# Patient Record
Sex: Male | Born: 1950 | Race: White | Hispanic: No | Marital: Married | Smoking: Never smoker
Health system: Southern US, Community
[De-identification: ages and names within clinical notes are randomized; demographics above are authoritative.]

## PROBLEM LIST (undated history)

## (undated) DIAGNOSIS — K5790 Diverticulosis of intestine, part unspecified, without perforation or abscess without bleeding: Secondary | ICD-10-CM

## (undated) DIAGNOSIS — R7303 Prediabetes: Secondary | ICD-10-CM

## (undated) DIAGNOSIS — I2510 Atherosclerotic heart disease of native coronary artery without angina pectoris: Secondary | ICD-10-CM

## (undated) DIAGNOSIS — Z86010 Personal history of colonic polyps: Secondary | ICD-10-CM

## (undated) DIAGNOSIS — G4733 Obstructive sleep apnea (adult) (pediatric): Secondary | ICD-10-CM

## (undated) DIAGNOSIS — I10 Essential (primary) hypertension: Secondary | ICD-10-CM

## (undated) DIAGNOSIS — G500 Trigeminal neuralgia: Secondary | ICD-10-CM

## (undated) DIAGNOSIS — Z955 Presence of coronary angioplasty implant and graft: Secondary | ICD-10-CM

## (undated) DIAGNOSIS — G4730 Sleep apnea, unspecified: Secondary | ICD-10-CM

## (undated) DIAGNOSIS — C801 Malignant (primary) neoplasm, unspecified: Secondary | ICD-10-CM

## (undated) DIAGNOSIS — E785 Hyperlipidemia, unspecified: Secondary | ICD-10-CM

## (undated) HISTORY — PX: SPINE SURGERY: SHX786

## (undated) HISTORY — PX: OTHER SURGICAL HISTORY: SHX169

## (undated) HISTORY — DX: Presence of coronary angioplasty implant and graft: Z95.5

## (undated) HISTORY — DX: Atherosclerotic heart disease of native coronary artery without angina pectoris: I25.10

## (undated) HISTORY — PX: ACHILLES TENDON REPAIR: SUR1153

## (undated) HISTORY — DX: Sleep apnea, unspecified: G47.30

## (undated) HISTORY — DX: Trigeminal neuralgia: G50.0

## (undated) HISTORY — DX: Hyperlipidemia, unspecified: E78.5

## (undated) HISTORY — PX: BILATERAL CARPAL TUNNEL RELEASE: SHX6508

## (undated) HISTORY — DX: Obstructive sleep apnea (adult) (pediatric): G47.33

## (undated) HISTORY — DX: Prediabetes: R73.03

## (undated) HISTORY — DX: Essential (primary) hypertension: I10

## (undated) HISTORY — PX: NASAL SINUS SURGERY: SHX719

## (undated) HISTORY — DX: Diverticulosis of intestine, part unspecified, without perforation or abscess without bleeding: K57.90

## (undated) HISTORY — DX: Personal history of colonic polyps: Z86.010

---

## 1998-03-22 ENCOUNTER — Observation Stay (HOSPITAL_COMMUNITY): Admission: RE | Admit: 1998-03-22 | Discharge: 1998-03-23 | Payer: Self-pay | Admitting: Orthopedic Surgery

## 1998-03-24 ENCOUNTER — Encounter (HOSPITAL_COMMUNITY): Admission: RE | Admit: 1998-03-24 | Discharge: 1998-06-22 | Payer: Self-pay | Admitting: Orthopedic Surgery

## 1998-07-17 ENCOUNTER — Inpatient Hospital Stay (HOSPITAL_COMMUNITY): Admission: EM | Admit: 1998-07-17 | Discharge: 1998-07-21 | Payer: Self-pay | Admitting: Orthopedic Surgery

## 1998-08-09 ENCOUNTER — Encounter: Admission: RE | Admit: 1998-08-09 | Discharge: 1998-08-09 | Payer: Self-pay | Admitting: Internal Medicine

## 2003-07-02 HISTORY — PX: BACK SURGERY: SHX140

## 2004-06-20 ENCOUNTER — Ambulatory Visit (HOSPITAL_COMMUNITY): Admission: RE | Admit: 2004-06-20 | Discharge: 2004-06-20 | Payer: Self-pay | Admitting: *Deleted

## 2006-08-04 ENCOUNTER — Ambulatory Visit (HOSPITAL_BASED_OUTPATIENT_CLINIC_OR_DEPARTMENT_OTHER): Admission: RE | Admit: 2006-08-04 | Discharge: 2006-08-04 | Payer: Self-pay | Admitting: Otolaryngology

## 2006-08-10 ENCOUNTER — Ambulatory Visit: Payer: Self-pay | Admitting: Internal Medicine

## 2006-08-21 ENCOUNTER — Ambulatory Visit (HOSPITAL_COMMUNITY): Admission: RE | Admit: 2006-08-21 | Discharge: 2006-08-22 | Payer: Self-pay | Admitting: Otolaryngology

## 2006-08-21 ENCOUNTER — Encounter (INDEPENDENT_AMBULATORY_CARE_PROVIDER_SITE_OTHER): Payer: Self-pay | Admitting: Specialist

## 2006-08-21 HISTORY — PX: UVULOPALATOPHARYNGOPLASTY: SHX827

## 2009-09-29 DIAGNOSIS — G500 Trigeminal neuralgia: Secondary | ICD-10-CM

## 2009-09-29 HISTORY — DX: Trigeminal neuralgia: G50.0

## 2009-10-25 HISTORY — PX: CRANIOTOMY: SHX93

## 2010-07-06 ENCOUNTER — Encounter (INDEPENDENT_AMBULATORY_CARE_PROVIDER_SITE_OTHER): Payer: Self-pay | Admitting: *Deleted

## 2010-07-27 ENCOUNTER — Encounter (INDEPENDENT_AMBULATORY_CARE_PROVIDER_SITE_OTHER): Payer: Self-pay | Admitting: *Deleted

## 2010-07-30 ENCOUNTER — Ambulatory Visit
Admission: RE | Admit: 2010-07-30 | Discharge: 2010-07-30 | Payer: Self-pay | Source: Home / Self Care | Attending: Internal Medicine | Admitting: Internal Medicine

## 2010-08-02 NOTE — Letter (Signed)
Summary: Pre Visit Letter Revised  [facility]  [address] [place], [place] [address]   Phone: [phone]  Fax: [phone]        07/06/2010 MRN: [id number] John Berry [address] [place], [place]  [address]             Procedure Date:  August 13, 2010   recall col- Dr Abundio Miu to the Gastroenterology Division at [facility].    You are scheduled to see a nurse for your pre-procedure visit on July 30, 2010 at 8:30am on the 3rd floor at [facility], [address]  We ask that you try to arrive at our office 15 minutes prior to your appointment time to allow for check-in.  Please take a minute to review the attached form.  If you answer "Yes" to one or more of the questions on the first page, we ask that you call the person listed at your earliest opportunity.  If you answer "No" to all of the questions, please complete the rest of the form and bring it to your appointment.    Your nurse visit will consist of discussing your medical and surgical history, your immediate family medical history, and your medications.   If you are unable to list all of your medications on the form, please bring the medication bottles to your appointment and we will list them.  We will need to be aware of both prescribed and over the counter drugs.  We will need to know exact dosage information as well.    Please be prepared to read and sign documents such as consent forms, a financial agreement, and acknowledgement forms.  If necessary, and with your consent, a friend or relative is welcome to sit-in on the nurse visit with you.  Please bring your insurance card so that we may make a copy of it.  If your insurance requires a referral to see a specialist, please bring your referral form from your primary care physician.  No co-pay is required for this nurse visit.     If you cannot keep your appointment, please call [phone] to cancel or  reschedule prior to your appointment date.  This allows [place] the opportunity to schedule an appointment for another patient in need of care.    Thank you for choosing  Gastroenterology for your medical needs.  We appreciate the opportunity to care for you.  Please visit [place] at our website  to learn more about our practice.  Sincerely, The Gastroenterology Division

## 2010-08-08 NOTE — Miscellaneous (Signed)
Summary: RX moviprep  Clinical Lists Changes  Medications: Added new medication of MOVIPREP 100 GM  SOLR (PEG-KCL-NACL-NASULF-NA ASC-C) As per prep instructions. - Signed Rx of MOVIPREP 100 GM  SOLR (PEG-KCL-NACL-NASULF-NA ASC-C) As per prep instructions.;  #1 x 0;  Signed;  Entered by: Sherren Kerns RN;  Authorized by: Iva Boop MD, [facility];  Method used: Electronically to [facility]  [phone]*, [address], [place], [place], [place]  [address], Ph: [phone] or [phone], Fax: [phone] Observations: Added new observation of NKA: T (07/30/2010 9:00)    Prescriptions: MOVIPREP 100 GM  SOLR (PEG-KCL-NACL-NASULF-NA ASC-C) As per prep instructions.  #1 x 0   Entered by:   Sherren Kerns RN   Authorized by:   Iva Boop MD, [facility]   Signed by:   Sherren Kerns RN on 07/30/2010   Method used:   Electronically to        [facility]  [phone]* (retail)       [address]       [place], [place]  [address]       Ph: [phone] or [phone]       Fax: [phone]   RxID:   [id number]

## 2010-08-08 NOTE — Letter (Signed)
Summary: [facility] Instructions  [facility]  [address] [place], [place] [address]   Phone: [phone]  Fax: [phone]       John Berry    09-Feb-1951    MRN: [id number]        Procedure Day Dorna Bloom:  Duanne Limerick  08/13/10     Arrival Time:  10:00AM      Procedure Time:  11:00AM     Location of Procedure:                    Juliann Pares _  [facility] (4th Floor)  PREPARATION FOR COLONOSCOPY WITH MOVIPREP   Starting 5 days prior to your procedure 08/08/10 do not eat nuts, seeds, popcorn, corn, beans, peas,  salads, or any raw vegetables.  Do not take any fiber supplements (e.g. Metamucil, Citrucel, and Benefiber).  THE DAY BEFORE YOUR PROCEDURE         DATE: 08/12/10  DAY: SUNDAY  1.  Drink clear liquids the entire day-NO SOLID FOOD  2.  Do not drink anything colored red or purple.  Avoid juices with pulp.  No orange juice.  3.  Drink at least 64 oz. (8 glasses) of fluid/clear liquids during the day to prevent dehydration and help the prep work efficiently.  CLEAR LIQUIDS INCLUDE: Water Jello Ice Popsicles Tea (sugar ok, no milk/cream) Powdered fruit flavored drinks Coffee (sugar ok, no milk/cream) Gatorade Juice: apple, white grape, white cranberry  Lemonade Clear bullion, consomm, broth Carbonated beverages (any kind) Strained chicken noodle soup Hard Candy                             4.  In the morning, mix first dose of MoviPrep solution:    Empty 1 Pouch A and 1 Pouch B into the disposable container    Add lukewarm drinking water to the top line of the container. Mix to dissolve    Refrigerate (mixed solution should be used within 24 hrs)  5.  Begin drinking the prep at 5:00 p.m. The MoviPrep container is divided by 4 marks.   Every 15 minutes drink the solution down to the next mark (approximately 8 oz) until the full liter is complete.   6.  Follow completed prep with 16 oz of clear liquid of your choice (Nothing red or purple).   Continue to drink clear liquids until bedtime.  7.  Before going to bed, mix second dose of MoviPrep solution:    Empty 1 Pouch A and 1 Pouch B into the disposable container    Add lukewarm drinking water to the top line of the container. Mix to dissolve    Refrigerate  THE DAY OF YOUR PROCEDURE      DATE: 08/13/10  DAY: MONDAY  Beginning at 6:00AM (5 hours before procedure):         1. Every 15 minutes, drink the solution down to the next mark (approx 8 oz) until the full liter is complete.  2. Follow completed prep with 16 oz. of clear liquid of your choice.    3. You may drink clear liquids until 9:00AM (2 HOURS BEFORE PROCEDURE).   MEDICATION INSTRUCTIONS  Unless otherwise instructed, you should take regular prescription medications with a small sip of water   as early as possible the morning of your procedure.           OTHER INSTRUCTIONS  You will need a responsible  adult at least 58 years of age to accompany you and drive you home.   This person must remain in the waiting room during your procedure.  Wear loose fitting clothing that is easily removed.  Leave jewelry and other valuables at home.  However, you may wish to bring a book to read or  an iPod/MP3 player to listen to music as you wait for your procedure to start.  Remove all body piercing jewelry and leave at home.  Total time from sign-in until discharge is approximately 2-3 hours.  You should go home directly after your procedure and rest.  You can resume normal activities the  day after your procedure.  The day of your procedure you should not:   Drive   Make legal decisions   Operate machinery   Drink alcohol   Return to work  You will receive specific instructions about eating, activities and medications before you leave.    The above instructions have been reviewed and explained to me by   Sherren Kerns RN  July 30, 2010 8:52 AM   I fully understand and can verbalize these  instructions _____________________________ Date _________

## 2010-08-13 ENCOUNTER — Other Ambulatory Visit (AMBULATORY_SURGERY_CENTER): Payer: PRIVATE HEALTH INSURANCE | Admitting: Internal Medicine

## 2010-08-13 ENCOUNTER — Other Ambulatory Visit: Payer: Self-pay | Admitting: Internal Medicine

## 2010-08-13 DIAGNOSIS — Z1211 Encounter for screening for malignant neoplasm of colon: Secondary | ICD-10-CM

## 2010-08-13 DIAGNOSIS — I8501 Esophageal varices with bleeding: Secondary | ICD-10-CM

## 2010-08-13 DIAGNOSIS — D126 Benign neoplasm of colon, unspecified: Secondary | ICD-10-CM

## 2010-08-13 DIAGNOSIS — Z860100 Personal history of colon polyps, unspecified: Secondary | ICD-10-CM

## 2010-08-13 DIAGNOSIS — K5730 Diverticulosis of large intestine without perforation or abscess without bleeding: Secondary | ICD-10-CM

## 2010-08-13 HISTORY — DX: Personal history of colonic polyps: Z86.010

## 2010-08-13 HISTORY — DX: Personal history of colon polyps, unspecified: Z86.0100

## 2010-08-13 HISTORY — PX: COLONOSCOPY: SHX174

## 2010-08-18 ENCOUNTER — Encounter: Payer: Self-pay | Admitting: Internal Medicine

## 2010-08-22 NOTE — Procedures (Addendum)
Summary: Colonoscopy  Patient: John Berry Note: All result statuses are Final unless otherwise noted.  Tests: (1) Colonoscopy (COL)   COL Colonoscopy           DONE (C)     [facility]     [address]     [place], [place]  [address]           COLONOSCOPY PROCEDURE REPORT           PATIENT:  John Berry, John Berry  MR#:  [id number]     BIRTHDATE:  08/23/1950, 59 yrs. old  GENDER:  male     ENDOSCOPIST:  Iva Boop, MD, [facility]           PROCEDURE DATE:  08/13/2010     PROCEDURE:  Colonoscopy with snare polypectomy     ASA CLASS:  Class II     INDICATIONS:  Routine Risk Screening (return before 10 years due     to suboptimal prep in past)     MEDICATIONS:   Fentanyl 50 mcg IV, Versed 6 mg IV CORRECTION: 5 mg     Versed           DESCRIPTION OF PROCEDURE:   After the risks benefits and     alternatives of the procedure were thoroughly explained, informed     consent was obtained.  Digital rectal exam was performed and     revealed no abnormalities and normal prostate.   The LB 180AL     [id number] endoscope was introduced through the anus and advanced to     the cecum, which was identified by both the appendix and ileocecal     valve, without limitations.  The quality of the prep was     excellent, using MoviPrep.  The instrument was then slowly     withdrawn as the colon was fully examined. insertion: 1:15 minutes     Withdrawal: 12:51 minutes     <<PROCEDUREIMAGES>>           FINDINGS:  Five polyps were found. Maximum size was 5-6 mm.     Minimum size 3mm. Two ascending, one transverse, one splenic     flexure and one sigmoid. Polyps were snared without cautery.     Retrieval was successful. Moderate diverticulosis was found in the     left colon.  This was otherwise a normal examination of the colon,     including right colon retroflexion.   Retroflexed views in the     rectum revealed internal hemorrhoids.   (Photo taken but     malfunction did not allow  inclusion in report). The scope was then     withdrawn from the patient and the procedure completed.           COMPLICATIONS:  None     ENDOSCOPIC IMPRESSION:     1) Five polyps removed, largest was 5-6 mm.     2) Moderate diverticulosis in the left colon     3) Internal hemorrhoids     4) Otherwise normal examination, excellent prep           REPEAT EXAM:  In for Colonoscopy, pending biopsy results.           Iva Boop, MD, [facility]           CC:  Lesle Chris, MD     The Patient           n.     REVISED:  08/18/2010  10:46 AM     eSIGNED:   Iva Boop at 08/18/2010 10:46 AM           Claudie Fisherman, [id number]  Note: An exclamation mark (!) indicates a result that was not dispersed into the flowsheet. Document Creation Date: 08/18/2010 10:47 AM _______________________________________________________________________  (1) Order result status: Final Collection or observation date-time: 08/13/2010 11:59 Requested date-time:  Receipt date-time:  Reported date-time:  Referring Physician:   Ordering Physician: Stan Head [phone]) Specimen Source:  Source: Launa Grill Order Number: [id number] Lab site:   Appended Document: Colonoscopy     Procedures Next Due Date:    Colonoscopy: 08/2013

## 2010-08-22 NOTE — Letter (Addendum)
Summary: Patient Notice- Polyp Results  [facility]  [address] [place], [place] [address]   Phone: [phone]  Fax: [phone]        August 18, 2010 MRN: [id number]    ELMORE HYSLOP [address] [place], [place]  [address]    Dear Mr. ELMS,   The polyps removed from your colon were adenomatous. This means that they were pre-cancerous or that  they had the potential to change into cancer over time.   I recommend that you have a repeat colonoscopy in 3 years to determine if you have developed any new polyps over time and screen for colorectal cancer. If you develop any new rectal bleeding, abdominal pain or significant bowel habit changes, please contact us before then.  In addition to repeating colonoscopy, changing health habits may reduce your risk of having more colon or rectal  polyps and possibly, colorectal cancer. You may lower your risk of future polyps and colorectal cancer by adopting healthy habits such as not smoking or using tobacco (if you do), being physically active, losing weight (if overweight), and eating a diet which includes fruits and vegetables and limits red meat.  Please call us if you are having persistent problems or have questions about your condition that have not been fully answered at this time.  Sincerely,  Iva Boop MD, [facility]  This letter has been electronically signed by your physician.  Appended Document: Patient Notice- Polyp Results letter mailed

## 2010-11-16 NOTE — Op Note (Signed)
John Berry, OBAR            ACCOUNT NO.:  [id number]   MEDICAL RECORD NO.:  [id number]          PATIENT TYPE:  OIB   LOCATION:  2550                         FACILITY:  [facility]   PHYSICIAN:  Hermelinda Medicus, M.D.   DATE OF BIRTH:  [birth year]   DATE OF PROCEDURE:  08/21/2006  DATE OF DISCHARGE:                               OPERATIVE REPORT   PREOPERATIVE DIAGNOSES:  Sleep apnea with body mass index of 30, O2  nadir of 80, RDI of 55.   POSTOPERATIVE DIAGNOSES:  Sleep apnea with body mass index of 30, O2  nadir of 80, RDI of 55.   OPERATION:  Septal reconstruction, turbinate reduction with a palatal  pharyngoplasty uvulopalatopharyngoplasty.   SURGEON:  Hermelinda Medicus. M.D.   ANESTHESIA:  General endotracheal with Dr. Jean Rosenthal.   The patient is aware of the risks and gains, the fact that he is going  to have a sore throat for about 10 days.  He cannot travel to the beach  or mountains for approximately 10 days.  His nasal airway will be  limited until that swelling is improved.  He will be spending overnight  in 3300 for postop observation, that he is not to do any heavy lifting  or swimming for the next several days.   PROCEDURE:  The patient was placed in the supine position under general  endotracheal anesthesia.  The patient was prepped and draped in the  appropriate manner using the usual head drape and then the 1% Xylocaine  with epinephrine 4 mL and topical cocaine 200 mg was used on the nose  and septum to anesthetize this region.  The inferior turbinates were  aggressively outfractured and considerable space was gained by doing  this.  The septum was then approached where a hemitransfixion incision  was made and this was made on the left side, carried around the  columella to the right and back along the quadrilateral cartilage and  ethmoid septal deviation area.  We took a strip of cartilage from the  posterior quadrilateral cartilage and the anterior ethmoid  septal  deviation was corrected by using the open and close Laren Boom.  The cartilage was also off the premaxillary crest on the right side and  this was taken down freeing up the mucous membrane and then taking down  a portion of vomerine septum using the 4-mm chisel.  Once this was  completed the remainder of the septum was placed back on the  premaxillary crest where it appropriately should be in the midline.  It  was established in the midline.  Closure was with 5-0 plain catgut and a  through-and-through septal suture was used using 4-0 plain catgut x2 as  a hemostatic and a blanket stitch to minimize swelling.  Telfa was  placed in the nose.  Attention was then carried to the oral cavity where  the tonsillar gag was placed.  The uvula was approximately twice as  large of its normal status and the palate was considerably low.  The  palate was trimmed using the Metzenbaum scissors and delicately then  cauterized to minimize any bleeding.  The contour of the palate was  elevated approximately 8-9 mm and then the uvula also was trimmed to its  normal size.  All hemostasis was established with Bovie  electrocoagulation.  Once this was completed the stomach was suctioned.  The Telfa was removed.  The bipolar cautery was then used on the  inferior turbinates inferior and lateral and then an 8 and 7 1/2  anesthesia trumpet was used to guarantee the nasal airway while the  patient was awakening.  These will be removed this afternoon.  The  patient tolerated the procedure very well.  Hemostasis was  complete.  The blood loss was estimated approximately 10 mL.  Follow-up  will be an outpatient in 23-hour observation in the 3300 and then will  be seen by me in 5 days, 10 days, 3 weeks, 5 weeks, 3 months, 6 months  and a year.           ______________________________  Hermelinda Medicus, M.D.     JC/MEDQ  D:  08/21/2006  T:  08/21/2006  Job:  [id number]   cc:   Brett Canales A. Cleta Alberts, M.D.

## 2010-11-16 NOTE — Procedures (Signed)
NAME:  John Berry, John Berry            ACCOUNT NO.:  [id number]   MEDICAL RECORD NO.:  [id number]          PATIENT TYPE:  OUT   LOCATION:  SLEEP CENTER                 FACILITY:  [facility]   PHYSICIAN:  Clinton D. Maple Hudson, MD, FCCP, FACPDATE OF BIRTH:  01-14-1951   DATE OF STUDY:  08/04/2006                            NOCTURNAL POLYSOMNOGRAM   REFERRING PHYSICIAN:  Hermelinda Medicus MD   INDICATION FOR STUDY:  Hypersomnia with sleep apnea.   EPWORTH SLEEPINESS SCORE:  13/24. BMI 30.4, weight 225 pounds.   MEDICATIONS:  Lexapro.   SLEEP ARCHITECTURE:  Total sleep time 336 minutes with sleep efficiency  86%. Stage I was 10%, stage II 79%, stages III and IV absent. REM 11% of  total sleep time. Sleep latency 17 minutes. REM latency 95 minutes.  Awake after sleep onset 37 minutes. Arousal index 24.2. No bedtime  medication was taken.   RESPIRATORY DATA:  NPSG diagnostic protocol as ordered. Apnea/hypopnea  index (AHI, RDI) 55.1 obstructive events per hour indicating moderately  severe obstructive sleep apnea/hypopnea syndrome. There were 207  obstructive apnea's, 102 hypopnea's. Events were not positional. REM AHI  36.2.   OXYGEN DATA:  Moderate snoring with oxygen desaturation to a nadir of  80%. Mean oxygen saturation through the study was 93% on room air.   CARDIAC DATA:  Normal sinus rhythm.   MOVEMENT-PARASOMNIA:  Frequent limb jerks but none clearly associated  with arousal or awakening.   IMPRESSIONS-RECOMMENDATION:  1. Moderately severe obstructive sleep apnea/hypopnea syndrome, AHI      55.1/Hr. with non positional events, moderate to loud snoring and      oxygen desaturation to a nadir of 80%.  2. Consider return for CPAP titration or evaluate for alternative      therapies as appropriate.      Clinton D. Maple Hudson, MD, [facility], FACP  Diplomate, Biomedical engineer of Sleep Medicine  Electronically Signed    CDY/MEDQ  D:  08/10/2006 13:10:19  T:  08/10/2006 19:01:33  Job:  [id number]

## 2010-11-16 NOTE — H&P (Signed)
John Berry, John Berry            ACCOUNT NO.:  [id number]   MEDICAL RECORD NO.:  [id number]          PATIENT TYPE:  OIB   LOCATION:  2550                         FACILITY:  [facility]   PHYSICIAN:  Hermelinda Medicus, M.D.   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  He weighs 225 pounds.  He is having considerable fatigue and a drained  feeling and chronic fatigue with poor sleeping and sleep deprivation.  He has also been told that he does snore, and he has had obstruction,  and he had a polysomnogram which showed a BMI of 30.4, and RDI of 55.1.  His stage 3 and 4 in REM sleep represented only 11% of his time of  sleep, and his O2 saturation nadir was 80%.  His opening O2 saturation  was 93%.  The discussion of CPAP was carried out, but with his septal  deviation that is obstructive in nature, the CPAP would be of little  help.  Also his work [place] an [place] where there is no  electricity, so CPAP would be of no use here.  Therefore, we have  elected to do a septal reconstruction and a turbinate reduction in an  effort to gain some nasal space, and then to do a uvulopalatoplasty  under general endotracheal anesthesia to improve the oral airway, to  raise the contour of the palate in this very shallow small oral cavity.   His past history is quite unremarkable.  He does not drink and does not  smoke.  He has no allergies to medications.  His further past history is  that in his right leg he had cartilage surgery in 1974 and 1986.  He had  a torn Achilles tendon in 1992 and in 1984 he had back surgery x2.  In  2005 he had an Achilles tendon tear.  In 2006 he had a colonoscopy.  Bowel, bladder, kidneys, respiratory are all within normal limits.   PHYSICAL EXAMINATION:  Reveals a well-nourished,  well-developed male in  no acute distress.  VITAL SIGNS:  Blood pressure 147/90, pulse 52, respiration 16,  temperature 97.8, weight 106.2 k.  His O2 sat here was 99%.  His ears  are clear.  The tympanic membranes are clear and move well.  The nose  shows a septal deviation shifting over to the right in the ethmoid and  collateral cartilage region and then swinging back to the left causing a  columellar deviation to the left.  Turbinate hypertrophy is also  present.  The oral cavity shows a low uvula, long uvula and a low palate  with a high arch tone.  No ulceration or lesion.  The larynx is clear to  __________  epiglottis space of tongue are free of ulceration or mass or  inflammation and true mobility, gag reflex, tongue mobility, EOMs,  facial nerves, shoulder strength are all symmetrical.  His neck is free  of any thyromegaly  or cervical adenopathy or mass, but it is quite full.  His tongue rides high in his oral cavity.  His oral cavity is small.  CHEST:  The chest is clear.  No rales, rhonchi or wheezes.  CARDIOVASCULAR:  Normal.  No rubs, murmurs or gallops.  His EKG shows  sinus bradycardia.  He has had a stress EKG also which was established  within normal limits.  ABDOMEN:  Unremarkable.  He has had a colonoscopy recently.  His  extremities are as above mentioned about the previous surgeries.   DIAGNOSES:  1. Sleep apnea with septal deviation, turbinate hypertrophy with a low      uvula and palate and a small oral cavity.  2. Sleep apnea with sleep deprivation.  3. In 1974 cartilage right leg surgery.  In 1986 torn Achilles tendon.      In 2005 torn Achilles tendon.  In 1984 and 1992 back surgery.  In      2006 colonoscopy.           ______________________________  Hermelinda Medicus, M.D.     JC/MEDQ  D:  08/21/2006  T:  08/22/2006  Job:  [id number]   cc:   Brett Canales A. Cleta Alberts, M.D.

## 2011-11-13 ENCOUNTER — Ambulatory Visit: Payer: 59 | Admitting: Emergency Medicine

## 2011-11-13 VITALS — BP 127/81 | HR 62 | Temp 98.0°F | Resp 16 | Ht 70.5 in | Wt 236.6 lb

## 2011-11-13 DIAGNOSIS — E785 Hyperlipidemia, unspecified: Secondary | ICD-10-CM

## 2011-11-13 DIAGNOSIS — I10 Essential (primary) hypertension: Secondary | ICD-10-CM

## 2011-11-13 DIAGNOSIS — F439 Reaction to severe stress, unspecified: Secondary | ICD-10-CM

## 2011-11-13 MED ORDER — ALPRAZOLAM 1 MG PO TABS: 1.0000 mg | ORAL_TABLET | Freq: Two times a day (BID) | ORAL | Status: DC | PRN

## 2011-11-13 MED ORDER — PRAVASTATIN SODIUM 40 MG PO TABS: 40.0000 mg | ORAL_TABLET | Freq: Every day | ORAL | Status: DC

## 2011-11-13 NOTE — Progress Notes (Signed)
  Subjective:    Patient ID: John Berry, male    DOB: [birth year], 61 y.o.   MRN: [id number]  HPI Presents for re-evaluation of HTN and hyperlipidemia.  Needs refills.  Out of lisinopril x 8 weeks, pravastatin x 4 weeks.  He is not fasting today.  He was employed at a [employer] until about 2 weeks ago, when his position was eliminated for cost savings.  His wife is undergoing cancer treatment. She would like him to have a refill of alprazolam, but he doesn't think he'll need it.   Review of Systems No chest pain, SOB, HA, dizziness, vision change, N/V, diarrhea, melena, hematochezia, dysuria, urinary urgency or frequency, unexplained myalgias or arthralgias, or rash.     Objective:   Physical Exam Vital signs noted. Well-developed, well nourished WM who is awake, alert and oriented, in NAD. HEENT: [place]/AT, sclera and conjunctiva are clear.   Neck: supple, non-tender, no lymphadenopathy, thyromegaly. Heart: RRR, no murmur Lungs: CTA Extremities: no cyanosis, clubbing or edema. Skin: warm and dry without rash.  No labs drawn today, as he is non-fasting.      Assessment & Plan:   1. HTN (hypertension)  Controlled.  2. Other and unspecified hyperlipidemia  pravastatin (PRAVACHOL) 40 MG tablet  3. Situational stress  ALPRAZolam (XANAX) 1 MG tablet   Patient Instructions  Check your blood pressure 2-3 times weekly and record the readings.  If your readings start to trend up and are consistently above 140/90, please call so we can restart the lisinopril.   Re-evaluate in 3 months with fasting lipids, CMET.  Discussed with Dr. Cleta Alberts.

## 2011-11-13 NOTE — Patient Instructions (Signed)
Check your blood pressure 2-3 times weekly and record the readings.  If your readings start to trend up and are consistently above 140/90, please call so we can restart the lisinopril.

## 2011-12-10 ENCOUNTER — Emergency Department (HOSPITAL_COMMUNITY): Payer: PRIVATE HEALTH INSURANCE

## 2011-12-10 ENCOUNTER — Encounter (HOSPITAL_COMMUNITY): Payer: Self-pay | Admitting: *Deleted

## 2011-12-10 ENCOUNTER — Emergency Department (HOSPITAL_COMMUNITY)
Admission: EM | Admit: 2011-12-10 | Discharge: 2011-12-10 | Disposition: A | Discharge status: Home / Self Care | Payer: PRIVATE HEALTH INSURANCE | Attending: Emergency Medicine | Admitting: Emergency Medicine

## 2011-12-10 DIAGNOSIS — F329 Major depressive disorder, single episode, unspecified: Secondary | ICD-10-CM | POA: Insufficient documentation

## 2011-12-10 DIAGNOSIS — F3289 Other specified depressive episodes: Secondary | ICD-10-CM | POA: Insufficient documentation

## 2011-12-10 DIAGNOSIS — I10 Essential (primary) hypertension: Secondary | ICD-10-CM | POA: Insufficient documentation

## 2011-12-10 DIAGNOSIS — G4733 Obstructive sleep apnea (adult) (pediatric): Secondary | ICD-10-CM | POA: Insufficient documentation

## 2011-12-10 DIAGNOSIS — R079 Chest pain, unspecified: Secondary | ICD-10-CM | POA: Insufficient documentation

## 2011-12-10 DIAGNOSIS — E785 Hyperlipidemia, unspecified: Secondary | ICD-10-CM | POA: Insufficient documentation

## 2011-12-10 LAB — DIFFERENTIAL
Basophils Absolute: 0 10*3/uL (ref 0.0–0.1)
Basophils Relative: 1 % (ref 0–1)
Eosinophils Absolute: 0.1 10*3/uL (ref 0.0–0.7)
Eosinophils Relative: 2 % (ref 0–5)
Lymphocytes Relative: 51 % — ABNORMAL HIGH (ref 12–46)
Lymphs Abs: 2.7 10*3/uL (ref 0.7–4.0)
Monocytes Absolute: 0.5 10*3/uL (ref 0.1–1.0)
Monocytes Relative: 9 % (ref 3–12)
Neutro Abs: 2 10*3/uL (ref 1.7–7.7)
Neutrophils Relative %: 38 % — ABNORMAL LOW (ref 43–77)

## 2011-12-10 LAB — POCT I-STAT, CHEM 8
BUN: 21 mg/dL (ref 6–23)
Calcium, Ion: 1.13 mmol/L (ref 1.12–1.32)
Chloride: 107 mEq/L (ref 96–112)
Creatinine, Ser: 1.2 mg/dL (ref 0.50–1.35)
Glucose, Bld: 97 mg/dL (ref 70–99)
HCT: 45 % (ref 39.0–52.0)
Hemoglobin: 15.3 g/dL (ref 13.0–17.0)
Potassium: 3.9 mEq/L (ref 3.5–5.1)
Sodium: 143 mEq/L (ref 135–145)
TCO2: 22 mmol/L (ref 0–100)

## 2011-12-10 LAB — PROTIME-INR
INR: 1 (ref 0.00–1.49)
Prothrombin Time: 13.4 seconds (ref 11.6–15.2)

## 2011-12-10 LAB — CBC
HCT: 44.8 % (ref 39.0–52.0)
Hemoglobin: 15 g/dL (ref 13.0–17.0)
MCH: 29.8 pg (ref 26.0–34.0)
MCHC: 33.5 g/dL (ref 30.0–36.0)
MCV: 89.1 fL (ref 78.0–100.0)
Platelets: 197 10*3/uL (ref 150–400)
RBC: 5.03 MIL/uL (ref 4.22–5.81)
RDW: 14.4 % (ref 11.5–15.5)
WBC: 5.3 10*3/uL (ref 4.0–10.5)

## 2011-12-10 LAB — APTT: aPTT: 27 seconds (ref 24–37)

## 2011-12-10 LAB — POCT I-STAT TROPONIN I
Troponin i, poc: 0 ng/mL (ref 0.00–0.08)
Troponin i, poc: 0 ng/mL (ref 0.00–0.08)

## 2011-12-10 MED ORDER — SODIUM CHLORIDE 0.9 % IV SOLN: Freq: Once | INTRAVENOUS | Status: DC

## 2011-12-10 NOTE — Discharge Instructions (Signed)
Call the cardiologist listed above for further evaluation of your chest pain. Return to the hospital for severe or worsening symptoms. Take a baby aspirin once daily until followup.  Your caregiver has diagnosed you as having chest pain that is nonspecific for one problem. This means that after looking at you and examining you and ordering tests (such as blood work, chest x-rays and EKG), your caregiver does not believe that the problem is serious enough to need watching in the hospital. This judgment is often made after testing shows no acute heart attack and you are at low risk for sudden acute heart condition. Chest pain comes from many different causes.  Seek immediate medical attention if:   You have severe chest pain, especially if the pain is crushing or pressure-like and spreads to the arms, back, neck, or jaw, or if you have sweating, nausea, shortness of breath. This is an emergency. Don't wait to see if the pain will go away. Get medical help at once. Call 911 immediately. Do not drive herself to the hospital.   Your chest pain gets worse and does not go away with rest.   You have an attack of chest pain lasting longer than usual, despite rest and treatment with the medications your caregiver has prescribed   You awaken from sleep with chest pain or shortness of breath.   You feel faint or dizzy   You have chest pain not typical of your usual pain for which you originally saw your caregiver.  You must have a repeat evaluation within 24 hours for a recheck of your heart.  Please call your doctor this morning to schedule this appointment. If you do not have a family doctor, please see the list of doctors below.  RESOURCE GUIDE  Dental Problems  Patients with Medicaid: [facility] [phone] [address]                                           [phone] [address] Phone:  [phone]                                                   Phone:  [phone]  If unable to pay or uninsured, contact:  [facility] or [facility] to become qualified for the adult dental clinic.  Chronic Pain Problems Contact [facility]  [phone] Patients need to be referred by their primary care doctor.  Insufficient Money for Medicine Contact [employer] Way:  call "211" or [facility] (906)553-9274.  No Primary Care Doctor Call [facility]  [phone] Other agencies that provide inexpensive medical care    [facility]  [phone]    [facility]  [phone]    [facility]  [phone]    [facility]  [phone]    [facility]  [phone]    [facility]  [phone]  Psychological Services [facility] Behavioral Health  [phone] [facility]  [phone] [facility] Mental Health   [phone] (emergency services [phone])  Substance Abuse Resources [facility]  (726)674-1190 [facility] [phone] [facility] (713) [phone] Floydene Flock [phone] Residential & Outpatient Substance Abuse Program  [phone]  Abuse/Neglect [facility] Child Abuse Hotline 432-728-6521 [facility] Child Abuse Hotline 780-591-9280 (After Hours)  Emergency Shelter [facility] Ministries [phone])[phone]  Maternity Homes Room at the [place] [facility] 3513554840 [facility] (303)[phone]  MRSA Hotline #:   (386)281-6574    [facility] Resources  [facility] [place]     [employer]                          [facility] Dept. [address] [place]                       [address]      [address] [place] [address]  Blondell Reveal Phone:  [phone]                                   Phone:  [phone]                 Phone:  [phone]  [facility] Mental Health Phone:   [phone]  [facility] Child Abuse Hotline [phone] (After Hours)

## 2011-12-10 NOTE — ED Notes (Signed)
AIDET performed. 

## 2011-12-10 NOTE — ED Notes (Signed)
Pt is expressing hesitation at being admitted to CDU and is requesting to speak to Dr Hyacinth Meeker.  Dr Hyacinth Meeker at bedside.  Presently, pt denies pain, sob and feels that his pain is being caues by stress.

## 2011-12-10 NOTE — ED Notes (Signed)
Pt has agreed to stay for 2nd troponin draw.  If that is negative he will go home.

## 2011-12-10 NOTE — ED Notes (Signed)
The pt says the pain is worse with movement

## 2011-12-10 NOTE — ED Notes (Signed)
Pt to ED c/o pain to R chest accompanied by high blood pressure.  Presently denise any pain or sob.  States pain increases when he moves.

## 2011-12-10 NOTE — ED Notes (Signed)
The pt is c/o rt sided chest tightness for 2-3 days no nv or sob

## 2011-12-10 NOTE — ED Provider Notes (Signed)
History     CSN: [id number]  Arrival date & time 12/10/11  0156   First MD Initiated Contact with Patient 12/10/11 0357      Chief Complaint  Patient presents with  . Chest Pain    (Consider location/radiation/quality/duration/timing/severity/associated sxs/prior treatment) HPI Comments: Onset of tightness in the chest 2 days ago - has been waxing and waning.  Has had associated light headedness today and near syncope.  BP was 160/100 tonight - had recently taken BP meds but was stopped 2 weeks ago at MD's request b/c was improved.  Awoke at 1:30 with increased pressure in the R and middle chest.  The pain comes and goes - seems to be worse with turning or doing sit ups.  Associated symptoms - no SOB, no nausea, no diaphoresis.  No cough.    Cardiac history - no heart problems - had stress test in last 8 years - was normal at that time.    Patient is a 61 y.o. male presenting with chest pain. The history is provided by the patient.  Chest Pain     Past Medical History  Diagnosis Date  . Diverticulosis     colon  . Hypertension   . Hyperlipidemia   . OSA (obstructive sleep apnea)   . Depression   . Trigeminal neuralgia 4/11    Microvascular decompression    Past Surgical History  Procedure Date  . Craniotomy 10/25/2009    for trigeminal neuralgia  . Spine surgery     lumbar x 2  . Achilles tendon repair     x 3  . Nasal sinus surgery     Family History  Problem Relation Age of Onset  . Hypertension Mother   . Heart disease Mother     History  Substance Use Topics  . Smoking status: Never Smoker   . Smokeless tobacco: Not on file  . Alcohol Use: No      Review of Systems  Cardiovascular: Positive for chest pain.  All other systems reviewed and are negative.    Allergies  Review of patient's allergies indicates no known allergies.  Home Medications   Current Outpatient Rx  Name Route Sig Dispense Refill  . ALPRAZOLAM 1 MG PO TABS Oral Take 1  mg by mouth 2 (two) times daily as needed. For anxiety    . ASPIRIN 81 MG PO TABS Oral Take 81 mg by mouth daily.    Marland Kitchen PRAVASTATIN SODIUM 40 MG PO TABS Oral Take 40 mg by mouth daily.      BP 142/79  Pulse 50  Temp(Src) 97.8 F (36.6 C) (Oral)  Resp 17  SpO2 96%  Physical Exam  Nursing note and vitals reviewed. Constitutional: He appears well-developed and well-nourished. No distress.  HENT:  Head: Normocephalic and atraumatic.  Mouth/Throat: Oropharynx is clear and moist. No oropharyngeal exudate.  Eyes: Conjunctivae and EOM are normal. Pupils are equal, round, and reactive to light. Right eye exhibits no discharge. Left eye exhibits no discharge. No scleral icterus.  Neck: Normal range of motion. Neck supple. No JVD present. No thyromegaly present.  Cardiovascular: Normal rate, regular rhythm, normal heart sounds and intact distal pulses.  Exam reveals no gallop and no friction rub.   No murmur heard. Pulmonary/Chest: Effort normal and breath sounds normal. No respiratory distress. He has no wheezes. He has no rales. He exhibits no tenderness.  Abdominal: Soft. Bowel sounds are normal. He exhibits no distension and no mass. There is no tenderness.  Musculoskeletal: Normal range of motion. He exhibits no edema and no tenderness.  Lymphadenopathy:    He has no cervical adenopathy.  Neurological: He is alert. Coordination normal.  Skin: Skin is warm and dry. No rash noted. No erythema.  Psychiatric: He has a normal mood and affect. His behavior is normal.    ED Course  Procedures (including critical care time)  Labs Reviewed  DIFFERENTIAL - Abnormal; Notable for the following:    Neutrophils Relative 38 (*)    Lymphocytes Relative 51 (*)    All other components within normal limits  CBC  APTT  PROTIME-INR  POCT I-STAT, CHEM 8  POCT I-STAT TROPONIN I  POCT I-STAT TROPONIN I   Dg Chest 2 View  12/10/2011  *RADIOLOGY REPORT*  Clinical Data: Chest pain  CHEST - 2 VIEW   Comparison: 08/20/2006  Findings: Heart size upper normal to mildly enlarged.  No focal consolidation.  No pleural effusion or pneumothorax.  No acute osseous finding.  Multilevel degenerative changes.  IMPRESSION: Heart size upper normal to mildly enlarged.  No focal consolidation.  Original Report Authenticated By: Waneta Martins, M.D.     1. Chest pain       MDM  At this time the patient appears comfortable, feels like he can reproduce his chest pain somewhat with range of motion of his arm.  Will placed on observational protocol - pt is low risk but not no risk for CAD.  ECG at this time is neg for ischemia.   ED ECG REPORT   Date: 12/10/2011  I have personally reviewed the ECG  Rate: 57  Rhythm: sinus bradycardia  QRS Axis: left  Intervals: normal  ST/T Wave abnormalities: normal  Conduction Disutrbances:nonspecific intraventricular conduction delay  Narrative Interpretation:   Old EKG Reviewed: none available  Pt CP free for > 3 hours - has had 2 sets of neg CE's and wants d/c instead of observation with stress this AM.  I have discussed with the patient at length the indications for strep test and my recommendations of staying in the observational protocol for this procedure. He has declined and states that he will followup as an outpatient. I have given him followup contact information for cardiology. In recommended daily aspirin until followup. He agrees to return to the emergency department as indicated by his symptoms.  Vida Roller, MD 12/10/11 [phone]

## 2011-12-12 ENCOUNTER — Ambulatory Visit (INDEPENDENT_AMBULATORY_CARE_PROVIDER_SITE_OTHER): Payer: PRIVATE HEALTH INSURANCE | Admitting: Internal Medicine

## 2011-12-12 ENCOUNTER — Ambulatory Visit (HOSPITAL_BASED_OUTPATIENT_CLINIC_OR_DEPARTMENT_OTHER): Payer: PRIVATE HEALTH INSURANCE | Admitting: Radiology

## 2011-12-12 ENCOUNTER — Encounter: Payer: Self-pay | Admitting: Internal Medicine

## 2011-12-12 VITALS — BP 110/72 | HR 71 | Ht 70.5 in | Wt 232.0 lb

## 2011-12-12 VITALS — BP 102/69 | Ht 72.0 in | Wt 230.0 lb

## 2011-12-12 DIAGNOSIS — R0789 Other chest pain: Secondary | ICD-10-CM

## 2011-12-12 DIAGNOSIS — E785 Hyperlipidemia, unspecified: Secondary | ICD-10-CM | POA: Insufficient documentation

## 2011-12-12 DIAGNOSIS — Z8249 Family history of ischemic heart disease and other diseases of the circulatory system: Secondary | ICD-10-CM | POA: Insufficient documentation

## 2011-12-12 DIAGNOSIS — R55 Syncope and collapse: Secondary | ICD-10-CM | POA: Insufficient documentation

## 2011-12-12 DIAGNOSIS — I10 Essential (primary) hypertension: Secondary | ICD-10-CM | POA: Insufficient documentation

## 2011-12-12 DIAGNOSIS — R079 Chest pain, unspecified: Secondary | ICD-10-CM

## 2011-12-12 DIAGNOSIS — R9439 Abnormal result of other cardiovascular function study: Secondary | ICD-10-CM | POA: Insufficient documentation

## 2011-12-12 DIAGNOSIS — R42 Dizziness and giddiness: Secondary | ICD-10-CM | POA: Insufficient documentation

## 2011-12-12 MED ORDER — TECHNETIUM TC 99M TETROFOSMIN IV KIT
30.0000 | PACK | Freq: Once | INTRAVENOUS | Status: AC | PRN
  Administered 2011-12-12: 30 via INTRAVENOUS

## 2011-12-12 MED ORDER — TECHNETIUM TC 99M TETROFOSMIN IV KIT
10.0000 | PACK | Freq: Once | INTRAVENOUS | Status: AC | PRN
  Administered 2011-12-12: 10 via INTRAVENOUS

## 2011-12-12 NOTE — Assessment & Plan Note (Signed)
The patient has multiple risk factors for coronary disease. He was in the emergency room the night before last. His troponins were negative. Stress imaging was recommended; however,  social reasons prompted him to  having to leave. This may represent GE reflux disease based on its positional nature; however, given his risk factors I think it is important prior to his leaving town to undertake stress testing as was recommended in the emergency room. We will pursue Myoview scanning. His exercise tolerance may require lexiscan.  I have recommended that he start with a proton pump inhibitor which he can obtain OTC.

## 2011-12-12 NOTE — Progress Notes (Signed)
CARDIOLOGY CONSULT NOTE  Patient ID: John Berry, MRN: [id number], DOB/AGE: 59-May-1952 59 y.o. Admit date: (Not on file) Date of Consult: 12/12/2011  Primary Physician: Lucilla Edin, MD Primary Cardiologist: new   Chief Complaint: chest pain  HPI John Berry is a 61 y.o. male : Seen at his own request because of chest pain.  He has a family history of heart disease, hypercholesterolemia and a prior history of hypertension. About 3 months ago he had an episode of right sided chest tightness and burning without radiation. It seemed to be aggravated primarily by motion of his chest. It then abated.  Over the last couple of days he has had increasing problems with a similar discomfort. It is aggravated by motion. It seems to be worse lying down but unassociated with brackish taste. He can be aggravated by eating.  He has been doing a great deal of psychosocial stress related to illness and work situations and children. He is not sleeping well.  He has a history of GE reflux disease which is in his mind distinct from this.  He has modest exercise intolerance and is short of breath at the top of a flight of stairs although not limited at that point. He does not have peripheral edema nocturnal dyspnea.  Past Medical History  Diagnosis Date  . Diverticulosis     colon  . Hypertension   . Hyperlipidemia   . OSA (obstructive sleep apnea)   . Depression   . Trigeminal neuralgia 4/11    Microvascular decompression      Surgical History:  Past Surgical History  Procedure Date  . Craniotomy 10/25/2009    for trigeminal neuralgia  . Spine surgery     lumbar x 2  . Achilles tendon repair     x 3  . Nasal sinus surgery      Home Meds: Prior to Admission medications   Medication Sig Start Date End Date Taking? Authorizing Provider  ALPRAZolam Prudy Feeler) 1 MG tablet Take 1 mg by mouth 2 (two) times daily as needed. For anxiety   Yes Historical Provider, MD  aspirin 81 MG  tablet Take 81 mg by mouth daily.   Yes Historical Provider, MD  pravastatin (PRAVACHOL) 40 MG tablet Take 40 mg by mouth daily.   Yes Historical Provider, MD     Allergies: No Known Allergies  History   Social History  . Marital Status: Married    Spouse Name: Arline Asp    Number of Children: 3  . Years of Education: N/A   Occupational History  . 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 Smoking status: Never Smoker   . Smokeless tobacco: Not on file  . Alcohol Use: No  . Drug Use: Not on file  . Sexually Active: Not on file   Other Topics Concern  . Not on file   Social History Narrative  . No narrative on file     Family History  Problem Relation Age of Onset  . Hypertension Mother   . Heart disease Mother      ROS:  Please see the history of present illness.     All other systems reviewed and negative.    Physical Exam: Blood pressure 110/72, pulse 71, height 5' 10.5" (1.791 m), weight 232 lb (105.235 kg), SpO2 97.00%. General: Well developed, well nourished male in no acute distress. Head: Normocephalic, atraumatic, sclera non-icteric, no xanthomas, nares are without discharge. Lymph Nodes:  none Neck:  Negative for carotid bruits. JVD not elevated. Lungs: Clear bilaterally to auscultation without wheezes, rales, or rhonchi. Breathing is unlabored. Heart: RRR with S1 S2. No murmurs, rubs, or gallops appreciated. Abdomen: Soft, non-tender, non-distended with normoactive bowel sounds. No hepatomegaly. No rebound/guarding. No obvious abdominal masses. Msk:  Strength and tone appear normal for age. Extremities: No clubbing or cyanosis. No edema.  Distal pedal pulses are 2+ and equal bilaterally. Skin: Warm and Dry Neuro: Alert and oriented X 3. CN III-XII intact Grossly normal sensory and motor function . Psych:  Responds to questions appropriately with flat affect.      Labs: Cardiac Enzymes No results found for this basename: CKTOTAL:4,CKMB:4,TROPONINI:4  in the last 72 hours CBC Lab Results  Component Value Date   WBC 5.3 12/10/2011   HGB 15.3 12/10/2011   HCT 45.0 12/10/2011   MCV 89.1 12/10/2011   PLT 197 12/10/2011   PROTIME:  Basename 12/10/11 0430  LABPROT 13.4  INR 1.00   Chemistry  Lab 12/10/11 0516  NA 143  K 3.9  CL 107  CO2 --  BUN 21  CREATININE 1.20  CALCIUM --  PROT --  BILITOT --  ALKPHOS --  ALT --  AST --  GLUCOSE 97   Lipids   EKG: Electrrocardiogram was reviewed from the hospital and had no ST-T changes.   Assessment and Plan:   Sherryl Manges

## 2011-12-12 NOTE — Progress Notes (Signed)
[facility] SITE 3 NUCLEAR MED [address] [place] [address] [phone]  Cardiology Nuclear Med Study  John Berry is a 61 y.o. male     MRN : [id number]     DOB: 1950/08/09  Procedure Date: 12/12/2011  Nuclear Med Background Indication for Stress Test:  Evaluation for Ischemia, 12/10/11 ER- CP, near syncope, negative enzymes History:  8 yrs ago GXT: NL  Cardiac Risk Factors: Family History - CAD, Hypertension and Lipids  Symptoms:  Chest Tightness Near Syncope, Lt. Headed   Nuclear Pre-Procedure Caffeine/Decaff Intake:  None NPO After: 9:00pm   Lungs:  clear O2 Sat: 98% on room air. IV 0.9% NS with Angio Cath:  20g  IV Site: R Hand  IV Started by:  Bonnita Levan, RN  Chest Size (in):  46 Cup Size: n/a  Height: 6' (1.829 m)  Weight:  230 lb (104.327 kg)  BMI:  Body mass index is 31.19 kg/(m^2). Tech Comments:  N/A    Nuclear Med Study 1 or 2 day study: 1 day  Stress Test Type:  Stress  Reading MD: Cassell Clement, MD  Order Authorizing Provider:  Berton Mount, MD  Resting Radionuclide: Technetium 2m Tetrofosmin  Resting Radionuclide Dose: 11.0 mCi   Stress Radionuclide:  Technetium 93m Tetrofosmin  Stress Radionuclide Dose: 33.0 mCi           Stress Protocol Rest HR: 72 Stress HR: 150  Rest BP: 102/69 Stress BP: 160/78  Exercise Time (min): 10:00 METS: 11.70   Predicted Max HR: 162 bpm % Max HR: 92.59 bpm Rate Pressure Product: 46962   Dose of Adenosine (mg):  n/a Dose of Lexiscan: n/a mg  Dose of Atropine (mg): n/a Dose of Dobutamine: n/a mcg/kg/min (at max HR)  Stress Test Technologist: Milana Na, EMT-P  Nuclear Technologist:  Domenic Polite, CNMT     Rest Procedure:  Myocardial perfusion imaging was performed at rest 45 minutes following the intravenous administration of Technetium 65m Tetrofosmin. Rest ECG: NSR - Normal EKG  Stress Procedure:  The patient performed treadmill exercise using a Bruce  Protocol for 10:00  minutes. The patient stopped due to fatigue and denied any chest pain.  There were no significant ST-T wave changes and rare pacs.  Technetium 55m Tetrofosmin was injected at peak exercise and myocardial perfusion imaging was performed after a brief delay. Stress ECG: No significant change from baseline ECG  QPS Raw Data Images:  Normal; no motion artifact; normal heart/lung ratio. Stress Images:  There is decreased uptake in the inferior wall. Rest Images:  There is decreased uptake in the inferior wall. Subtraction (SDS):  These findings are consistent with ischemia. Transient Ischemic Dilatation (Normal <1.22):  0.97 Lung/Heart Ratio (Normal <0.45):  0.32  Quantitative Gated Spect Images QGS EDV:  87 ml QGS ESV:  42 ml  Impression Exercise Capacity:  Good exercise capacity. BP Response:  Normal blood pressure response. Clinical Symptoms:  No chest pain. ECG Impression:  No significant ST segment change suggestive of ischemia. Comparison with Prior Nuclear Study: No images to compare  Overall Impression:  Abnormal stress nuclear study.  There is a small perfusion defect in the mid-inferior wall of moderate severity on the stress images which is present to a lesser extent on the rest images, suggestive of reversible ischemia. Although this may be due to variable degrees of diaphragmatic attenuation, reversible ischemia cannot be excluded.  LV Ejection Fraction: 51%.  LV Wall Motion:  NL LV Function; NL Wall  Motion   [employer]

## 2011-12-13 ENCOUNTER — Encounter (HOSPITAL_COMMUNITY): Payer: Self-pay | Admitting: Physician Assistant

## 2011-12-13 ENCOUNTER — Other Ambulatory Visit: Payer: Self-pay

## 2011-12-13 ENCOUNTER — Encounter (HOSPITAL_COMMUNITY)
Admission: AD | Disposition: A | Discharge status: Home / Self Care | Payer: Self-pay | Source: Ambulatory Visit | Attending: Cardiology

## 2011-12-13 ENCOUNTER — Inpatient Hospital Stay (HOSPITAL_COMMUNITY)
Admission: AD | Admit: 2011-12-13 | Discharge: 2011-12-17 | DRG: 247 | Disposition: A | Discharge status: Home / Self Care | Payer: PRIVATE HEALTH INSURANCE | Source: Ambulatory Visit | Attending: Cardiology | Admitting: Cardiology

## 2011-12-13 DIAGNOSIS — R55 Syncope and collapse: Secondary | ICD-10-CM | POA: Diagnosis present

## 2011-12-13 DIAGNOSIS — R079 Chest pain, unspecified: Secondary | ICD-10-CM

## 2011-12-13 DIAGNOSIS — Z8719 Personal history of other diseases of the digestive system: Secondary | ICD-10-CM

## 2011-12-13 DIAGNOSIS — Z8249 Family history of ischemic heart disease and other diseases of the circulatory system: Secondary | ICD-10-CM

## 2011-12-13 DIAGNOSIS — Z955 Presence of coronary angioplasty implant and graft: Secondary | ICD-10-CM

## 2011-12-13 DIAGNOSIS — E785 Hyperlipidemia, unspecified: Secondary | ICD-10-CM | POA: Diagnosis present

## 2011-12-13 DIAGNOSIS — Z7982 Long term (current) use of aspirin: Secondary | ICD-10-CM

## 2011-12-13 DIAGNOSIS — I2510 Atherosclerotic heart disease of native coronary artery without angina pectoris: Principal | ICD-10-CM | POA: Diagnosis present

## 2011-12-13 DIAGNOSIS — G4733 Obstructive sleep apnea (adult) (pediatric): Secondary | ICD-10-CM | POA: Diagnosis present

## 2011-12-13 DIAGNOSIS — K219 Gastro-esophageal reflux disease without esophagitis: Secondary | ICD-10-CM | POA: Diagnosis present

## 2011-12-13 DIAGNOSIS — F411 Generalized anxiety disorder: Secondary | ICD-10-CM | POA: Diagnosis present

## 2011-12-13 DIAGNOSIS — I498 Other specified cardiac arrhythmias: Secondary | ICD-10-CM | POA: Diagnosis present

## 2011-12-13 DIAGNOSIS — I200 Unstable angina: Secondary | ICD-10-CM | POA: Diagnosis present

## 2011-12-13 DIAGNOSIS — I10 Essential (primary) hypertension: Secondary | ICD-10-CM | POA: Diagnosis present

## 2011-12-13 DIAGNOSIS — I471 Supraventricular tachycardia: Secondary | ICD-10-CM | POA: Diagnosis not present

## 2011-12-13 HISTORY — PX: CORONARY ANGIOPLASTY: SHX604

## 2011-12-13 HISTORY — PX: LEFT HEART CATHETERIZATION WITH CORONARY ANGIOGRAM: SHX5451

## 2011-12-13 LAB — POCT ACTIVATED CLOTTING TIME: Activated Clotting Time: 369 seconds

## 2011-12-13 LAB — CBC
HCT: 41 % (ref 39.0–52.0)
Hemoglobin: 14.2 g/dL (ref 13.0–17.0)
MCH: 30.6 pg (ref 26.0–34.0)
MCHC: 34.6 g/dL (ref 30.0–36.0)
MCV: 88.4 fL (ref 78.0–100.0)
Platelets: 182 10*3/uL (ref 150–400)
RBC: 4.64 MIL/uL (ref 4.22–5.81)
RDW: 14.2 % (ref 11.5–15.5)
WBC: 6.7 10*3/uL (ref 4.0–10.5)

## 2011-12-13 LAB — CK TOTAL AND CKMB (NOT AT ARMC)
CK, MB: 5.1 ng/mL — ABNORMAL HIGH (ref 0.3–4.0)
CK, MB: 4.5 ng/mL — ABNORMAL HIGH (ref 0.3–4.0)
Relative Index: 2.7 — ABNORMAL HIGH (ref 0.0–2.5)
Relative Index: 3 — ABNORMAL HIGH (ref 0.0–2.5)
Total CK: 192 U/L (ref 7–232)
Total CK: 149 U/L (ref 7–232)

## 2011-12-13 SURGERY — LEFT HEART CATHETERIZATION WITH CORONARY ANGIOGRAM
Anesthesia: LOCAL

## 2011-12-13 MED ORDER — ONDANSETRON HCL 4 MG/2ML IJ SOLN
4.0000 mg | Freq: Four times a day (QID) | INTRAMUSCULAR | Status: DC | PRN
  Administered 2011-12-14: 4 mg via INTRAVENOUS
  Filled 2011-12-13: qty 2

## 2011-12-13 MED ORDER — MIDAZOLAM HCL 2 MG/2ML IJ SOLN
INTRAMUSCULAR | Status: AC
  Filled 2011-12-13: qty 2

## 2011-12-13 MED ORDER — CITALOPRAM HYDROBROMIDE 10 MG PO TABS
10.0000 mg | ORAL_TABLET | Freq: Every day | ORAL | Status: DC
  Administered 2011-12-13 – 2011-12-17 (×5): 10 mg via ORAL
  Filled 2011-12-13 (×5): qty 1

## 2011-12-13 MED ORDER — DIAZEPAM 5 MG PO TABS
5.0000 mg | ORAL_TABLET | ORAL | Status: AC
  Administered 2011-12-13: 5 mg via ORAL

## 2011-12-13 MED ORDER — FENTANYL CITRATE 0.05 MG/ML IJ SOLN
INTRAMUSCULAR | Status: AC
  Filled 2011-12-13: qty 2

## 2011-12-13 MED ORDER — ASPIRIN 81 MG PO CHEW
CHEWABLE_TABLET | ORAL | Status: AC
  Filled 2011-12-13: qty 3

## 2011-12-13 MED ORDER — ASPIRIN 81 MG PO CHEW
CHEWABLE_TABLET | ORAL | Status: AC
  Administered 2011-12-13: 324 mg via ORAL
  Filled 2011-12-13: qty 4

## 2011-12-13 MED ORDER — DIAZEPAM 5 MG PO TABS
ORAL_TABLET | ORAL | Status: AC
  Administered 2011-12-13: 5 mg via ORAL
  Filled 2011-12-13: qty 1

## 2011-12-13 MED ORDER — ALPRAZOLAM 0.5 MG PO TABS
1.0000 mg | ORAL_TABLET | Freq: Two times a day (BID) | ORAL | Status: DC | PRN
  Administered 2011-12-15 (×2): 1 mg via ORAL
  Filled 2011-12-13 (×4): qty 1

## 2011-12-13 MED ORDER — HEPARIN (PORCINE) IN NACL 2-0.9 UNIT/ML-% IJ SOLN
INTRAMUSCULAR | Status: AC
  Filled 2011-12-13: qty 2000

## 2011-12-13 MED ORDER — ASPIRIN 81 MG PO TABS: 81.0000 mg | ORAL_TABLET | Freq: Every day | ORAL | Status: DC

## 2011-12-13 MED ORDER — BIVALIRUDIN 250 MG IV SOLR
INTRAVENOUS | Status: AC
  Filled 2011-12-13: qty 250

## 2011-12-13 MED ORDER — SODIUM CHLORIDE 0.9 % IJ SOLN: 3.0000 mL | INTRAMUSCULAR | Status: DC | PRN

## 2011-12-13 MED ORDER — NITROGLYCERIN 0.2 MG/ML ON CALL CATH LAB
INTRAVENOUS | Status: AC
  Filled 2011-12-13: qty 1

## 2011-12-13 MED ORDER — SODIUM CHLORIDE 0.9 % IV SOLN: 1000.0000 mL | INTRAVENOUS | Status: DC

## 2011-12-13 MED ORDER — ACETAMINOPHEN 325 MG PO TABS
650.0000 mg | ORAL_TABLET | ORAL | Status: DC | PRN
  Administered 2011-12-15: 650 mg via ORAL
  Filled 2011-12-13 (×2): qty 2

## 2011-12-13 MED ORDER — CLOPIDOGREL BISULFATE 300 MG PO TABS
ORAL_TABLET | ORAL | Status: AC
  Administered 2011-12-14: 75 mg via ORAL
  Filled 2011-12-13: qty 2

## 2011-12-13 MED ORDER — SODIUM CHLORIDE 0.9 % IV SOLN
1.7500 mg/kg/h | INTRAVENOUS | Status: AC
  Administered 2011-12-13: 1.75 mg/kg/h via INTRAVENOUS
  Filled 2011-12-13: qty 250

## 2011-12-13 MED ORDER — EPTIFIBATIDE BOLUS VIA INFUSION
120.0000 ug/kg | Freq: Once | INTRAVENOUS | Status: AC
  Administered 2011-12-13: 12500 ug via INTRAVENOUS
  Filled 2011-12-13: qty 17

## 2011-12-13 MED ORDER — SODIUM CHLORIDE 0.9 % IV SOLN: 250.0000 mL | INTRAVENOUS | Status: DC | PRN

## 2011-12-13 MED ORDER — ENOXAPARIN SODIUM 40 MG/0.4ML ~~LOC~~ SOLN
40.0000 mg | SUBCUTANEOUS | Status: DC
  Filled 2011-12-13: qty 0.4

## 2011-12-13 MED ORDER — SODIUM CHLORIDE 0.9 % IV SOLN
INTRAVENOUS | Status: DC
  Administered 2011-12-13: 10:00:00 via INTRAVENOUS

## 2011-12-13 MED ORDER — EPTIFIBATIDE 75 MG/100ML IV SOLN
2.0000 ug/kg/min | INTRAVENOUS | Status: AC
  Administered 2011-12-13 – 2011-12-14 (×3): 2 ug/kg/min via INTRAVENOUS
  Filled 2011-12-13 (×4): qty 100

## 2011-12-13 MED ORDER — CLOPIDOGREL BISULFATE 75 MG PO TABS
75.0000 mg | ORAL_TABLET | Freq: Every day | ORAL | Status: DC
  Administered 2011-12-14 – 2011-12-16 (×3): 75 mg via ORAL
  Filled 2011-12-13 (×5): qty 1

## 2011-12-13 MED ORDER — ASPIRIN EC 81 MG PO TBEC
81.0000 mg | DELAYED_RELEASE_TABLET | Freq: Every day | ORAL | Status: DC
  Administered 2011-12-14 – 2011-12-17 (×4): 81 mg via ORAL
  Filled 2011-12-13 (×4): qty 1

## 2011-12-13 MED ORDER — ASPIRIN 81 MG PO CHEW
324.0000 mg | CHEWABLE_TABLET | ORAL | Status: AC
  Administered 2011-12-13: 324 mg via ORAL

## 2011-12-13 MED ORDER — SODIUM CHLORIDE 0.9 % IJ SOLN: 3.0000 mL | Freq: Two times a day (BID) | INTRAMUSCULAR | Status: DC

## 2011-12-13 MED ORDER — LIDOCAINE HCL (PF) 1 % IJ SOLN
INTRAMUSCULAR | Status: AC
  Filled 2011-12-13: qty 30

## 2011-12-13 MED ORDER — SODIUM CHLORIDE 0.9 % IV SOLN: INTRAVENOUS | Status: AC

## 2011-12-13 NOTE — Progress Notes (Signed)
Reviewed films with Dr Zara Chess and wall motion abnormality confirmed Myoview suggestive of scar.  With hx of abrupt syncope and Normal ECG, VT must be excluded and I have reviewed with pt and wife this recommendation which would follow relook of the stented RCA.   In addition would consider therapy for depression and in the absence of PCP at this juncture would use citolapram ; was on previously and he is open  Will try and do EPS on Mon +/- ICD

## 2011-12-13 NOTE — CV Procedure (Signed)
Cardiac Catheterization Procedure Note  Name: John Berry MRN: [id number] DOB: 06/25/1951  Procedure: Left Heart Cath, Selective Coronary Angiography, LV angiography,  PTCA/Stent of RCA  Indication: The patient is a very nice 61 year male with recent accelerating chest pain with positive stress test, inferior territory.  He saw Dr. Graciela Husbands, who felt that cardiac catheterization was indicated.    Diagnostic Procedure Details: The right radial territory was prepped and draped in the usual fashion.  We were unable to feed the wire into the right radial artery easily, and this was abandoned for the femoral approach.  The right groin was prepped, draped, and anesthetized with 1% lidocaine. Using the modified Seldinger technique, a 4 French sheath was introduced into the right femoral artery. Standard Judkins catheters were used for selective coronary angiography. Catheter exchanges were performed over a wire.  The diagnostic procedure was well-tolerated without immediate complications.  I then called Dr. Graciela Husbands to the room and we reviewed the films in detail and discussed the options with the patient.  After considerable discussion, we elected to proceed with PCI of the RCA based on the nuclear findings, and recent acceleration of symptoms.  I then discussed the case with the patient's wife.  The patient was also agreeable to enrollment in the [facility] II trial.   Clopidogrel was then given as on oral load.      PCI Procedure Note:  Following the diagnostic procedure, the decision was made to proceed with PCI. The sheath was upsized to a 6 [place]. Weight-based bivalirudin was given for anticoagulation.  LV angiography was performed and documented a corresponding inferior wall motion abnormality.   Once a therapeutic ACT was achieved, a 6 [place] JR4 GC with [facility] guide catheter was inserted.  A Luge coronary guidewire was used to cross the lesion.  The lesion was predilated with a 2.84mm by 15  BS compliant  balloon.  Standard views were then obtained and a GC view obtained for QCA purposes.  The lesion was then stented with a 4.80mm by 20mm Evolve study stent.  The stent was postdilated with a 4.0 noncompliant balloon within the confines of the previously placed stent.  .  Following PCI, there was 0% residual stenosis and TIMI-3 flow. Final angiography confirmed a good result, but concern over a distal edge disruption that was non flow limiting.  We kept the patient in the lab for a considerable period, and additional views were obtained.  We attempted to pass an IVUS catheter, but it would not navigate the proximal stent.  I called Dr. [place] to the lab to review the films and we debated weather or not to pass an additional stent.  The vessel remained patent with TIMI 3 flow throughout, and multiple additional views obtained.  After thoughtful consideration, we elected not to attempt to pass a second stent.  The patient tolerated the PCI procedure well.  The patient was transferred to the post catheterization recovery area for further monitoring.    PROCEDURAL FINDINGS Hemodynamics: AO 100/73  (86) LV 118/17  Coronary angiography: Coronary dominance: right  Left mainstem: There is no obstruction and the vessel is free of disease.  Left anterior descending (LAD): The vessel courses to the apex.  It provides the apex.  There is a focal 70% lesion just after a severely diseased septal perforating vessel.  The septal has tandem 80-90 lesions.  There is then a tiny diagonal, non graftable, with a 90% lesion ostial.  The remainder of  the LAD is smooth without critical disease and is graftable.    Left circumflex (LCx): Large with two large marginals.  There is minor irregularity but no critical disease.  There is a tiny av vessel.    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  In the mid portion of the mid vessel is a focal  80% stenosis.  The distal vessel courses distally and is large in caliber.  The PDA has a mid 70% lesion.  The PLA does not have critical disease.  After stenting, there is no residual stenosis at the lesion site.  There is a linear contrast hang up extending proximally at the distal edge of the stent.  There is perhaps 10-20% narrowing just distal to the stent, but with good distal flow.   Left ventriculography: Left ventricular systolic function is normal, LVEF is estimated at 45%, there is no significant mitral regurgitation.  The inferior base is severely hypokinetic.  There is mild anterolateral hypokinesis.    PCI Data: Vessel - RCA/Segment - 2 Percent Stenosis (pre)  80% TIMI-flow 3 Stent Evolve study stent Percent Stenosis (post) 0 TIMI-flow (post) 3 Linear edge disruption, non flow limiting.    Final Conclusions:   1.  Successful PCI of the mid RCA with reduction in stenosis from 80% to 0% with distal non flow limiting edge disruption 2.  Moderate stenosis of the LAD and septal as noted. 3.  Reduced overall LV function with an inferior wall motion abnormality.    Recommendations:  1.  Close observation over the weekend. 2.  Quick look of the RCA on Monday to document patency.     Shawnie Pons 12/13/2011, 3:19 PM

## 2011-12-13 NOTE — H&P (Signed)
History and Physical   Patient ID: John Berry MRN: [id number], DOB/AGE: February 09, 1951   Admit date: 12/13/2011 Date of Consult: 12/13/2011   Primary Physician: Lucilla Edin, MD Primary Cardiologist: New to cardiology (recently seen by Dr. Berton Mount in clinic)  Pt. rofile: Mr. John Berry is a 61yo male with PMHx significant for HTN, HL, family history of CAD and OSA who was recently seen in the [facility] clinic after presenting to [facility] ED with c/o chest pain, and underwent outpatient stress testing which was interpreted abnormal. He presents to short stay today for scheduled cardiac catheterization.   Problem List: Past Medical History  Diagnosis Date  . Diverticulosis     colon  . Hypertension   . Hyperlipidemia   . OSA (obstructive sleep apnea)   . Depression   . Trigeminal neuralgia 4/11    Microvascular decompression    Past Surgical History  Procedure Date  . Craniotomy 10/25/2009    for trigeminal neuralgia  . Spine surgery     lumbar x 2  . Achilles tendon repair     x 3  . Nasal sinus surgery      Allergies: No Known Allergies  PAST CARDIAC HISTORY  Exercise Myoview: abnormal- small perfusion defect in mid-inferior wall of moderate severity, dynamic on rest/stress imaging, suggestive of reversible ischemia; LVEF 51 %. Normal wall motion.   HPI:   At the [facility] ED in 12/10/11, POC troponin-I negative x 2. EKG without ischemic changes. BMET and CBC were unremarkable. CXR with mild cardiomegaly, otherwise without acute cardiopulmonary process. The plan was to pursue stress testing as part of the chest pain protocol in the ED, however had to leave for social reasons.   He was seen by Dr. Graciela Husbands in the clinic on 12/12/11. His symptoms were somewhat consistent with GE reflux; however given his cardiac risk factors, he was set up for exercise Myoview. This is described above, but was notably interpreted as abnormal. He was subsequently scheduled for outpatient  diagnostic cardiac catheterization for which he presents today in short stay.   Home Medications: Prior to Admission medications   Medication Sig Start Date End Date Taking? Authorizing Provider  ALPRAZolam Prudy Feeler) 1 MG tablet Take 1 mg by mouth 2 (two) times daily as needed. For anxiety    Historical Provider, MD  aspirin 81 MG tablet Take 81 mg by mouth daily.    Historical Provider, MD  pravastatin (PRAVACHOL) 40 MG tablet Take 40 mg by mouth daily.    Historical Provider, MD   Inpatient Medications:     . aspirin      . aspirin  324 mg Oral Pre-Cath  . diazepam      . diazepam  5 mg Oral On Call  . sodium chloride  3 mL Intravenous Q12H   Prescriptions prior to admission  Medication Sig Dispense Refill  . ALPRAZolam (XANAX) 1 MG tablet Take 1 mg by mouth 2 (two) times daily as needed. For anxiety      . aspirin 81 MG tablet Take 81 mg by mouth daily.      . pravastatin (PRAVACHOL) 40 MG tablet Take 40 mg by mouth daily.        Family History  Problem Relation Age of Onset  . Hypertension Mother   . Heart disease Mother 10  . Heart disease Brother 48     History   Social History  . Marital Status: Married    Spouse Name: John Berry  Number of Children: 3  . Years of Education: N/A   Occupational History  . 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 Smoking status: Never Smoker   . Smokeless tobacco: Not on file  . Alcohol Use: No  . Drug Use: Not on file  . Sexually Active: Not on file   Other Topics Concern  . Not on file   Social History Narrative   Lives in [place], [place] with wife.     Review of Systems: General: negative for chills, fever, night sweats or weight changes.  Cardiovascular: positive for chest pain, negative for  dyspnea on exertion, edema, orthopnea, palpitations, paroxysmal nocturnal dyspnea or shortness of breath Dermatological: negative for rash Respiratory: negative for cough or wheezing Urologic: negative for  hematuria Abdominal: negative for nausea, vomiting, diarrhea, bright red blood per rectum, melena, or hematemesis Neurologic: negative for visual changes, syncope, or dizziness All other systems reviewed and are otherwise negative except as noted above.  Physical Exam: Blood pressure 125/72, pulse 88, temperature 97.3 F (36.3 C), temperature source Oral, resp. rate 18, height 6' (1.829 m), weight 104.327 kg (230 lb), SpO2 96.00%.   General: Well developed, well nourished, in no acute distress. Head: Normocephalic, atraumatic, sclera non-icteric, no xanthomas, nares are without discharge.  Neck: Negative for carotid bruits. JVD not elevated. Lungs: Clear bilaterally to auscultation without wheezes, rales, or rhonchi. Breathing is unlabored. Heart:  RRR with S1 S2. No murmurs, rubs, or gallops appreciated. Abdomen: Soft, non-tender, non-distended with normoactive bowel sounds. No hepatomegaly. No rebound/guarding. No obvious abdominal masses. Msk:  Strength and tone appears normal for age. Extremities: No clubbing, cyanosis or edema.  Distal pedal pulses are 2+ and equal bilaterally. Neuro: Alert and oriented X 3. Moves all extremities spontaneously. Psych:  Responds to questions appropriately with a normal affect.  Labs:  Lab 12/10/11 0516  NA 143  K 3.9  CL 107  CO2 --  BUN 21  CREATININE 1.20  CALCIUM --  PROT --  BILITOT --  ALKPHOS --  ALT --  AST --  AMYLASE --  LIPASE --  GLUCOSE 97   Radiology/Studies: Dg Chest 2 View  12/10/2011  *RADIOLOGY REPORT*  Clinical Data: Chest pain  CHEST - 2 VIEW  Comparison: 08/20/2006  Findings: Heart size upper normal to mildly enlarged.  No focal consolidation.  No pleural effusion or pneumothorax.  No acute osseous finding.  Multilevel degenerative changes.  IMPRESSION: Heart size upper normal to mildly enlarged.  No focal consolidation.  Original Report Authenticated By: Waneta Martins, M.D.   ASSESSMENT:   1. Chest pain 2.  HTN 3. HL 4. OSA  DISCUSSION/PLAN:   Abnormal exercise Myoview as noted in the HPI. Presents to short stay today for elective diagnostic cardiac catheterization. Questions answered. Physical exam unremarkable. CBC, BMET, and troponin from 12/10/11 WNL. CXR with mild cardiomegaly, no acute process; EKG without ischemic changes at that time as well.   - Pre-cath orders entered  - NPO today  - Further recommendations based on interventionalist's findings   Signed, R. Hurman Horn, PA-C 12/13/2011, 10:04 AM   I spoke with Dr. Graciela Husbands who called me about the patient.  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  He did have an episode of syncope last week, after three hours of working hard in the heat.  He has had symptoms for a few months.  Current exam is unremarkable.    Myocardial  perfusion imaging is abnormal as noted on the OP nuclear scan note.  Dr. Graciela Husbands felt that catheterization was indicated, and the patient prefers a radial approach.  I have reviewed the risks and benefits with the patient, and he is agreeable and desires to proceed.  His wife accompanies him today.    Shawnie Pons 10:43 AM 12/13/2011

## 2011-12-13 NOTE — Progress Notes (Signed)
Site area: right groin  Site Prior to Removal:  Level 0  Pressure Applied For 20  MINUTES    Minutes Beginning at 1635  Manual:   yes  Patient Status During Pull:  stable  Post Pull Groin Site:  Level 0  Post Pull Instructions Given:  yes  Post Pull Pulses Present:  yes  Dressing Applied:  yes  Comments:

## 2011-12-13 NOTE — Research (Signed)
EVOLVE II Research Study Informed Consent   Subject Name: John Berry  Subject met inclusion and exclusion criteria.  The informed consent form, study requirements and expectations were reviewed with the subject and questions and concerns were addressed prior to the signing of the consent form.  The subject verbalized understanding of the trail requirements.  The subject agreed to participate in the EVOLVE II Research Study and signed the informed consent.  The informed consent was obtained prior to performance of any protocol-specific procedures for the subject.  A copy of the signed informed consent was given to the subject and a copy was placed in the subject's medical record.  Claire Shown 12/13/2011, 4:23 PM

## 2011-12-13 NOTE — H&P (View-Only) (Signed)
 CARDIOLOGY CONSULT NOTE  Patient ID: John Berry, MRN: [id number], DOB/AGE: 01/04/1951 60 y.o. Admit date: (Not on file) Date of Consult: 12/12/2011  Primary Physician: DAUB, STEVE A, MD Primary Cardiologist: new   Chief Complaint: chest pain  HPI John Berry is Berry 60 y.o. male : Seen at his own request because of chest pain.  He has Berry family history of heart disease, hypercholesterolemia and Berry prior history of hypertension. About 3 months ago he had an episode of right sided chest tightness and burning without radiation. It seemed to be aggravated primarily by motion of his chest. It then abated.  Over the last couple of days he has had increasing problems with Berry similar discomfort. It is aggravated by motion. It seems to be worse lying down but unassociated with brackish taste. He can be aggravated by eating.  He has been doing Berry great deal of psychosocial stress related to illness and work situations and children. He is not sleeping well.  He has Berry history of GE reflux disease which is in his mind distinct from this.  He has modest exercise intolerance and is short of breath at the top of Berry flight of stairs although not limited at that point. He does not have peripheral edema nocturnal dyspnea.  Past Medical History  Diagnosis Date  . Diverticulosis     colon  . Hypertension   . Hyperlipidemia   . OSA (obstructive sleep apnea)   . Depression   . Trigeminal neuralgia 4/11    Microvascular decompression      Surgical History:  Past Surgical History  Procedure Date  . Craniotomy 10/25/2009    for trigeminal neuralgia  . Spine surgery     lumbar x 2  . Achilles tendon repair     x 3  . Nasal sinus surgery      Home Meds: Prior to Admission medications   Medication Sig Start Date End Date Taking? Authorizing Provider  ALPRAZolam (XANAX) 1 MG tablet Take 1 mg by mouth 2 (two) times daily as needed. For anxiety   Yes Historical Provider, MD  aspirin 81 MG  tablet Take 81 mg by mouth daily.   Yes Historical Provider, MD  pravastatin (PRAVACHOL) 40 MG tablet Take 40 mg by mouth daily.   Yes Historical Provider, MD     Allergies: No Known Allergies  History   Social History  . Marital Status: Married    Spouse Name: John Berry    Number of Children: 3  . Years of Education: N/Berry   Occupational History  . 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 Smoking status: Never Smoker   . Smokeless tobacco: Not on file  . Alcohol Use: No  . Drug Use: Not on file  . Sexually Active: Not on file   Other Topics Concern  . Not on file   Social History Narrative  . No narrative on file     Family History  Problem Relation Age of Onset  . Hypertension Mother   . Heart disease Mother      ROS:  Please see the history of present illness.     All other systems reviewed and negative.    Physical Exam: Blood pressure 110/72, pulse 71, height 5' 10.5" (1.791 m), weight 232 lb (105.235 kg), SpO2 97.00%. General: Well developed, well nourished male in no acute distress. Head: Normocephalic, atraumatic, sclera non-icteric, no xanthomas, nares are without discharge. Lymph Nodes:  none Neck:   Negative for carotid bruits. JVD not elevated. Lungs: Clear bilaterally to auscultation without wheezes, rales, or rhonchi. Breathing is unlabored. Heart: RRR with S1 S2. No murmurs, rubs, or gallops appreciated. Abdomen: Soft, non-tender, non-distended with normoactive bowel sounds. No hepatomegaly. No rebound/guarding. No obvious abdominal masses. Msk:  Strength and tone appear normal for age. Extremities: No clubbing or cyanosis. No edema.  Distal pedal pulses are 2+ and equal bilaterally. Skin: Warm and Dry Neuro: Alert and oriented X 3. CN III-XII intact Grossly normal sensory and motor function . Psych:  Responds to questions appropriately with flat affect.      Labs: Cardiac Enzymes No results found for this basename: CKTOTAL:4,CKMB:4,TROPONINI:4  in the last 72 hours CBC Lab Results  Component Value Date   WBC 5.3 12/10/2011   HGB 15.3 12/10/2011   HCT 45.0 12/10/2011   MCV 89.1 12/10/2011   PLT 197 12/10/2011   PROTIME:  Basename 12/10/11 0430  LABPROT 13.4  INR 1.00   Chemistry  Lab 12/10/11 0516  NA 143  K 3.9  CL 107  CO2 --  BUN 21  CREATININE 1.20  CALCIUM --  PROT --  BILITOT --  ALKPHOS --  ALT --  AST --  GLUCOSE 97   Lipids   EKG: Electrrocardiogram was reviewed from the hospital and had no ST-T changes.   Assessment and Plan:   John Berry  

## 2011-12-13 NOTE — Progress Notes (Addendum)
Pictures and procedure reviewed in detail with patient, as well as current treatment plan.  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  Films reviewed with colleagues.  Not able to pass IVUS catheter due to vessel tortuosity leading into stent.  Will relook Monday, and if ok maintain a conservative posture on this.  If recurrent pain over the weekend, lean toward prompt catheterization.  Discussed with patient and he is on board for restudy.  Will add P2Y12 study for am.   Also, patient has had syncope, and has a significant WMA in the inferior wall that corresponded to the area of ischemia/scar (persistent defect with some redistribution) on the nuclear scan. Reviewed with patient.  Given history, will proceed with studies as recommended.      Shawnie Pons, MD, [facility], [facility]

## 2011-12-13 NOTE — Progress Notes (Signed)
MEDICATION RELATED CONSULT NOTE - INITIAL   Pharmacy Re:  Eptifibatide Indication:  CAD - Cath on Monday  No Known Allergies  Patient Measurements: Height: 6' (182.9 cm) Weight: 230 lb (104.327 kg) IBW/kg (Calculated) : 77.6  Adjusted Body Weight: ~ 86 kg  Vital Signs: Temp: 97.7 F (36.5 C) (06/14 2011) Temp src: Oral (06/14 2011) BP: 117/74 mmHg (06/14 2011) Pulse Rate: 63  (06/14 2011) Intake/Output from previous day:   Intake/Output from this shift: Total I/O In: 100 [P.O.:100] Out: 400 [Urine:400]  Labs: No results found for this basename: WBC:3,HGB:3,HCT:3,PLT:3,APTT:3;INR:3,CREATININE:3,LABCREA:3,CREATININE:3,CREAT24HRUR:3,MG:3,PHOS:3,ALBUMIN:3,PROT:3,ALBUMIN:3,AST:3,ALT:3,ALKPHOS:3,BILITOT:3,BILIDIR:3,IBILI:3 in the last 72 hours Estimated Creatinine Clearance: 83.8 ml/min (by C-G formula based on Cr of 1.2).   Microbiology: No results found for this or any previous visit (from the past 720 hour(s)).  Medical History: Past Medical History  Diagnosis Date  . Diverticulosis     colon  . Hypertension   . Hyperlipidemia   . OSA (obstructive sleep apnea)   . Depression   . Trigeminal neuralgia 4/11    Microvascular decompression    Medications:  Antiplatelet/2B3A/Anticoagulation  . aspirin  324 mg Oral Pre-Cath  . aspirin EC  81 mg Oral Daily  . bivalirudin      . bivalirudin (ANGIOMAX) infusion 5 mg/mL (Cath Lab,ACS,PCI indication)  1.75 mg/kg/hr Intravenous To Cath  . clopidogrel      . clopidogrel  75 mg Oral Q breakfast  . enoxaparin (LOVENOX) injection  40 mg Subcutaneous Q24H  . eptifibatide  120 mcg/kg Intravenous Once    Assessment: 60yo admitted with c/o chest pain.  He was taken to the cath lab today and plans for return on Monday.  His right groin was the site for the sheath introduction.  Spoke with Dr. Riley Kill and he desires Eptifibatide to be started and continued until Monday when he returns to the cath lab.  Currently, patient is without  noted bleeding complications and CBC is WNL.    Goal of Therapy:  Therapeutic response with 2B3A on board.  Plan:  - As discussed with Dr. Riley Kill, we will initiate therapy with a partial bolus of 132mcg/kg x 1 and then start IV Eptifibatide at 24mcg/kg/min. - Monitor CBC and s/s of bleeding complications.  Thank you for allowing pharmacy to be part of this patients care team.  Nadara Mustard, PharmD., MS Clinical Pharmacist Pager:  [phone] 12/13/2011,8:59 PM

## 2011-12-13 NOTE — Interval H&P Note (Signed)
History and Physical Interval Note:  12/13/2011 10:41 AM  John Berry  has presented today for surgery, with the diagnosis of chest pain  The various methods of treatment have been discussed with the patient and family. After consideration of risks, benefits and other options for treatment, the patient has consented to  Procedure(s) (LRB): LEFT HEART CATHETERIZATION WITH CORONARY ANGIOGRAM (N/A) as a surgical intervention .  The patients' history has been reviewed, patient examined, no change in status, stable for surgery.  I have reviewed the patients' chart and labs.  Questions were answered to the patient's satisfaction.     Shawnie Pons
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
## 2011-12-14 DIAGNOSIS — I2510 Atherosclerotic heart disease of native coronary artery without angina pectoris: Secondary | ICD-10-CM

## 2011-12-14 DIAGNOSIS — E785 Hyperlipidemia, unspecified: Secondary | ICD-10-CM | POA: Insufficient documentation

## 2011-12-14 DIAGNOSIS — I498 Other specified cardiac arrhythmias: Secondary | ICD-10-CM

## 2011-12-14 DIAGNOSIS — I200 Unstable angina: Secondary | ICD-10-CM

## 2011-12-14 DIAGNOSIS — R55 Syncope and collapse: Secondary | ICD-10-CM

## 2011-12-14 DIAGNOSIS — R079 Chest pain, unspecified: Secondary | ICD-10-CM

## 2011-12-14 DIAGNOSIS — I471 Supraventricular tachycardia: Secondary | ICD-10-CM | POA: Diagnosis not present

## 2011-12-14 LAB — CBC
HCT: 41.2 % (ref 39.0–52.0)
Hemoglobin: 14.2 g/dL (ref 13.0–17.0)
MCH: 30.4 pg (ref 26.0–34.0)
MCHC: 34.5 g/dL (ref 30.0–36.0)
MCV: 88.2 fL (ref 78.0–100.0)
Platelets: 175 10*3/uL (ref 150–400)
RBC: 4.67 MIL/uL (ref 4.22–5.81)
RDW: 14.1 % (ref 11.5–15.5)
WBC: 6.8 10*3/uL (ref 4.0–10.5)

## 2011-12-14 LAB — BASIC METABOLIC PANEL
BUN: 20 mg/dL (ref 6–23)
CO2: 26 mEq/L (ref 19–32)
Calcium: 8.6 mg/dL (ref 8.4–10.5)
Chloride: 101 mEq/L (ref 96–112)
Creatinine, Ser: 1.15 mg/dL (ref 0.50–1.35)
GFR calc Af Amer: 79 mL/min — ABNORMAL LOW (ref >90)
GFR calc non Af Amer: 68 mL/min — ABNORMAL LOW (ref >90)
Glucose, Bld: 102 mg/dL — ABNORMAL HIGH (ref 70–99)
Potassium: 4.1 mEq/L (ref 3.5–5.1)
Sodium: 139 mEq/L (ref 135–145)

## 2011-12-14 LAB — MRSA PCR SCREENING: MRSA by PCR: NEGATIVE

## 2011-12-14 LAB — CARDIAC PANEL(CRET KIN+CKTOT+MB+TROPI)
CK, MB: 3.9 ng/mL (ref 0.3–4.0)
Relative Index: 2.9 — ABNORMAL HIGH (ref 0.0–2.5)
Total CK: 134 U/L (ref 7–232)
Troponin I: <0.3 ng/mL (ref <0.30)

## 2011-12-14 MED ORDER — METOPROLOL TARTRATE 12.5 MG HALF TABLET
12.5000 mg | ORAL_TABLET | Freq: Once | ORAL | Status: AC
  Administered 2011-12-14: 12.5 mg via ORAL

## 2011-12-14 MED ORDER — ATORVASTATIN CALCIUM 80 MG PO TABS
80.0000 mg | ORAL_TABLET | Freq: Every day | ORAL | Status: DC
  Administered 2011-12-15 – 2011-12-16 (×2): 80 mg via ORAL
  Filled 2011-12-14 (×5): qty 1

## 2011-12-14 MED ORDER — METOPROLOL TARTRATE 12.5 MG HALF TABLET
12.5000 mg | ORAL_TABLET | Freq: Two times a day (BID) | ORAL | Status: DC
  Administered 2011-12-14 – 2011-12-17 (×6): 12.5 mg via ORAL
  Filled 2011-12-14 (×8): qty 1

## 2011-12-14 MED ORDER — EPTIFIBATIDE 75 MG/100ML IV SOLN
2.0000 ug/kg/min | INTRAVENOUS | Status: DC
  Administered 2011-12-14 – 2011-12-16 (×6): 2 ug/kg/min via INTRAVENOUS
  Filled 2011-12-14 (×14): qty 100

## 2011-12-14 NOTE — Progress Notes (Addendum)
Cardiac Rehab Phase I - Pt with runs of SVT this am.  Advised by primary RN not to ambulate in the hallway at this time.  Will f/u on Monday. Pt verbalized understanding.  Pt transferred to [id number].

## 2011-12-14 NOTE — Progress Notes (Signed)
Patient Name: John Berry Date of Encounter: 12/14/2011     SUBJECTIVE  Patient is stable.  History again reviewed in detail.  Patient went to ER three days ago because of a change in pattern of discomfort.  It was right parasternal before but then felt like mid central burning discomfort that prompted him to go to the ER.  He has no similar discomfort now, but does note that he has some when he moves in bed he thinks related to his back.  Strips also reviewed with nurses.  He has runs of what clearly are some SVT with runs up to between 150-175.  CURRENT MEDS    . aspirin  324 mg Oral Pre-Cath  . aspirin EC  81 mg Oral Daily  . bivalirudin      . bivalirudin (ANGIOMAX) infusion 5 mg/mL (Cath Lab,ACS,PCI indication)  1.75 mg/kg/hr Intravenous To Cath  . citalopram  10 mg Oral Daily  . clopidogrel      . clopidogrel  75 mg Oral Q breakfast  . diazepam  5 mg Oral On Call  . enoxaparin (LOVENOX) injection  40 mg Subcutaneous Q24H  . eptifibatide  120 mcg/kg Intravenous Once  . fentaNYL      . fentaNYL      . heparin      . lidocaine      . midazolam      . midazolam      . midazolam      . nitroGLYCERIN      . DISCONTD: aspirin  81 mg Oral Daily  . DISCONTD: sodium chloride  3 mL Intravenous Q12H    OBJECTIVE  Filed Vitals:   12/14/11 0100 12/14/11 0300 12/14/11 0500 12/14/11 0700  BP: 115/77 123/79 123/70 138/77  Pulse: 68 67 75   Temp:   98.7 F (37.1 C)   TempSrc:   Oral   Resp: 18 19 18 17   Height:      Weight:   238 lb 1.6 oz (108 kg)   SpO2: 94% 94% 95% 95%    Intake/Output Summary (Last 24 hours) at 12/14/11 0716 Last data filed at 12/14/11 0635  Gross per 24 hour  Intake 720.56 ml  Output   1150 ml  Net -429.44 ml   Filed Weights   12/13/11 0930 12/14/11 0500  Weight: 230 lb (104.327 kg) 238 lb 1.6 oz (108 kg)    PHYSICAL EXAM  General: Pleasant, NAD. Neuro: Alert and oriented X 3. Moves all extremities spontaneously. Psych: Normal  affect. HEENT:  Normal  Lungs:  Resp regular and unlabored, CTA. Heart: RRR no s3, s4, or murmurs. Abdomen: Soft, non-tender, non-distended, BS + x 4.  Extremities: No clubbing, cyanosis or edema. DP/PT/Radials 2+ and equal bilaterally.  Cath site looks good.   Accessory Clinical Findings  CBC  Basename 12/14/11 0215 12/13/11 2042  WBC 6.8 6.7  NEUTROABS -- --  HGB 14.2 14.2  HCT 41.2 41.0  MCV 88.2 88.4  PLT 175 182   Basic Metabolic Panel  Basename 12/14/11 0214  NA 139  K 4.1  CL 101  CO2 26  GLUCOSE 102*  BUN 20  CREATININE 1.15  CALCIUM 8.6  MG --  PHOS --   Liver Function Tests No results found for this basename: AST:2,ALT:2,ALKPHOS:2,BILITOT:2,PROT:2,ALBUMIN:2 in the last 72 hours No results found for this basename: LIPASE:2,AMYLASE:2 in the last 72 hours Cardiac Enzymes  Basename 12/14/11 0214 12/13/11 2042 12/13/11 1245  CKTOTAL 134 149 192  CKMB  3.9 4.5* 5.1*  CKMBINDEX -- -- --  TROPONINI <0.30 -- --   BNP No components found with this basename: POCBNP:3 D-Dimer No results found for this basename: DDIMER:2 in the last 72 hours Hemoglobin A1C No results found for this basename: HGBA1C in the last 72 hours Fasting Lipid Panel No results found for this basename: CHOL,HDL,LDLCALC,TRIG,CHOLHDL,LDLDIRECT in the last 72 hours Thyroid Function Tests No results found for this basename: TSH,T4TOTAL,FREET3,T3FREE,THYROIDAB in the last 72 hours  TELE  Runs of PSVT.  No obvious VT at this point.    ECG  pending  Radiology/Studies  Dg Chest 2 View  12/10/2011  *RADIOLOGY REPORT*  Clinical Data: Chest pain  CHEST - 2 VIEW  Comparison: 08/20/2006  Findings: Heart size upper normal to mildly enlarged.  No focal consolidation.  No pleural effusion or pneumothorax.  No acute osseous finding.  Multilevel degenerative changes.  IMPRESSION: Heart size upper normal to mildly enlarged.  No focal consolidation.  Original Report Authenticated By: Waneta Martins,  M.D.    ASSESSMENT    1.  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  Post procedure cardiac enzymes negative.  First CKMB done pre procedure was 5.1.  Negative in ER. 2.  Recent syncope with WMA ---  EP now thinks he needs EP study with possible ICD given inferior wall motion and syncope  3.  Recurrent runs of SVT  --- brief noted in cath lab, but currently multiple episodes.   4.  Hyperlipidemia  --  On statin therapy.   PLAN  1.  Monitor in cardiac stepdown over the weekend given the multiple issues including recent syncope, SVT, concern over VT, edge dissection 2.  Consider continuation of eptifibatide over the weekend  --- would normally use more potent thienopyridine to replace clopidogrel if PRU suggested platelet hypo-responsiveness in this situation, but want to preserve option for ICD on Monday if that becomes necessary, and this could become an issue.  With SVT, may alter plans.  Will discuss with EP. 3.  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  Dr. Graciela Husbands has restarted anti-depressant.   4.  Plan recath on Monday to assess RCA  --  With the assumption that he can be treated medically. 5.  Add beta blockers to regimen at present.   6.  Current plan is for medical treatment of LAD in absence of ischemia on radionuclide imaging.    I reviewed all of this with the patient in detail, and reviewed treatment plans.      Signed, Shawnie Pons MD, [facility], FSCAI

## 2011-12-15 LAB — CBC
HCT: 42.5 % (ref 39.0–52.0)
Hemoglobin: 14.9 g/dL (ref 13.0–17.0)
MCH: 30.8 pg (ref 26.0–34.0)
MCHC: 35.1 g/dL (ref 30.0–36.0)
MCV: 87.8 fL (ref 78.0–100.0)
Platelets: 163 10*3/uL (ref 150–400)
RBC: 4.84 MIL/uL (ref 4.22–5.81)
RDW: 13.9 % (ref 11.5–15.5)
WBC: 5.8 10*3/uL (ref 4.0–10.5)

## 2011-12-15 LAB — BASIC METABOLIC PANEL
BUN: 14 mg/dL (ref 6–23)
CO2: 24 mEq/L (ref 19–32)
Calcium: 8.8 mg/dL (ref 8.4–10.5)
Chloride: 102 mEq/L (ref 96–112)
Creatinine, Ser: 0.97 mg/dL (ref 0.50–1.35)
GFR calc Af Amer: >90 mL/min (ref >90)
GFR calc non Af Amer: 89 mL/min — ABNORMAL LOW (ref >90)
Glucose, Bld: 99 mg/dL (ref 70–99)
Potassium: 4.1 mEq/L (ref 3.5–5.1)
Sodium: 137 mEq/L (ref 135–145)

## 2011-12-15 MED ORDER — SODIUM CHLORIDE 0.9 % IJ SOLN
3.0000 mL | Freq: Two times a day (BID) | INTRAMUSCULAR | Status: DC
  Administered 2011-12-15: 3 mL via INTRAVENOUS

## 2011-12-15 MED ORDER — SODIUM CHLORIDE 0.9 % IV SOLN
1.0000 mL/kg/h | INTRAVENOUS | Status: DC
  Administered 2011-12-16: 1 mL/kg/h via INTRAVENOUS

## 2011-12-15 MED ORDER — ASPIRIN 81 MG PO CHEW
324.0000 mg | CHEWABLE_TABLET | ORAL | Status: AC
  Administered 2011-12-16: 324 mg via ORAL
  Filled 2011-12-15: qty 4

## 2011-12-15 MED ORDER — SODIUM CHLORIDE 0.9 % IV SOLN: 250.0000 mL | INTRAVENOUS | Status: DC | PRN

## 2011-12-15 MED ORDER — SODIUM CHLORIDE 0.9 % IJ SOLN: 3.0000 mL | INTRAMUSCULAR | Status: DC | PRN

## 2011-12-15 MED ORDER — FAMOTIDINE 40 MG PO TABS
40.0000 mg | ORAL_TABLET | Freq: Every day | ORAL | Status: DC
  Administered 2011-12-15 – 2011-12-17 (×3): 40 mg via ORAL
  Filled 2011-12-15 (×3): qty 1

## 2011-12-15 MED ORDER — DIAZEPAM 5 MG PO TABS
5.0000 mg | ORAL_TABLET | ORAL | Status: AC
  Administered 2011-12-16: 5 mg via ORAL
  Filled 2011-12-15: qty 1

## 2011-12-15 NOTE — Progress Notes (Signed)
Subjective:  Some burning discomfort in epigastrium last night along with feeling bloated.  Feels fine now.    Objective:  Vital Signs in the last 24 hours: Temp:  [97.8 F (36.6 C)-99.4 F (37.4 C)] 98.3 F (36.8 C) (06/16 0335) Pulse Rate:  [63-87] 73  (06/15 2000) Resp:  [11-21] 17  (06/16 0000) BP: (120-142)/(75-86) 134/77 mmHg (06/16 0400) SpO2:  [94 %-99 %] 95 % (06/16 0335)  Intake/Output from previous day: 06/15 0701 - 06/16 0700 In: 995.9 [P.O.:570; I.V.:425.9] Out: 2625 [Urine:2625]   Physical Exam: General: Well developed, well nourished, in no acute distress. Head:  Normocephalic and atraumatic. Lungs: Clear to auscultation and percussion. Heart: Normal S1 and S2.  No murmur, rubs or gallops.  Pulses: Pulses normal in all 4 extremities. Extremities: No clubbing or cyanosis. No edema. Neurologic: Alert and oriented x 3.    Lab Results:  Basename 12/15/11 0545 12/14/11 0215  WBC 5.8 6.8  HGB 14.9 14.2  PLT 163 175    Basename 12/15/11 0545 12/14/11 0214  NA 137 139  K 4.1 4.1  CL 102 101  CO2 24 26  GLUCOSE 99 102*  BUN 14 20  CREATININE 0.97 1.15    Basename 12/14/11 0214  TROPONINI <0.30   Hepatic Function Panel No results found for this basename: PROT,ALBUMIN,AST,ALT,ALKPHOS,BILITOT,BILIDIR,IBILI in the last 72 hours No results found for this basename: CHOL in the last 72 hours No results found for this basename: PROTIME in the last 72 hours  Imaging: No results found.  EKG:  NSR.  Delay in  R wave progression likely from lead position.   Cardiac Studies:    Assessment/Plan:  Patient Active Hospital Problem List: Coronary artery disease (12/14/2011)   Assessment: stable.  For repeat cardiac cath tomorrow.     Plan: If stable, would prefer to switch to prasugrel or ticagrelor.  Would normally use them now if possible device was not in the picture.  Groin and platelet count are stable.   SVT (supraventricular tachycardia) (12/14/2011)  Assessment: telemetry reviewed.  Less spells at this point   Plan: continue metoprolol. Syncope (12/14/2011)   Assessment: Will discuss with SK   Plan: Depending on results of cath decide further plans.  No evidence of significant ventricular ectopy      Shawnie Pons, MD, [facility], [facility] 12/15/2011, 7:40 AM   d

## 2011-12-16 ENCOUNTER — Encounter (HOSPITAL_COMMUNITY)
Admission: AD | Disposition: A | Discharge status: Home / Self Care | Payer: Self-pay | Source: Ambulatory Visit | Attending: Cardiology

## 2011-12-16 ENCOUNTER — Encounter (HOSPITAL_COMMUNITY): Payer: Self-pay | Admitting: Cardiology

## 2011-12-16 DIAGNOSIS — I2510 Atherosclerotic heart disease of native coronary artery without angina pectoris: Secondary | ICD-10-CM

## 2011-12-16 DIAGNOSIS — R55 Syncope and collapse: Secondary | ICD-10-CM

## 2011-12-16 HISTORY — PX: LEFT HEART CATHETERIZATION WITH CORONARY ANGIOGRAM: SHX5451

## 2011-12-16 HISTORY — PX: ELECTROPHYSIOLOGY STUDY: SHX5467

## 2011-12-16 LAB — CBC
HCT: 43 % (ref 39.0–52.0)
Hemoglobin: 14.8 g/dL (ref 13.0–17.0)
MCH: 30.5 pg (ref 26.0–34.0)
MCHC: 34.4 g/dL (ref 30.0–36.0)
MCV: 88.7 fL (ref 78.0–100.0)
Platelets: 160 10*3/uL (ref 150–400)
RBC: 4.85 MIL/uL (ref 4.22–5.81)
RDW: 13.9 % (ref 11.5–15.5)
WBC: 4.4 10*3/uL (ref 4.0–10.5)

## 2011-12-16 LAB — PROTIME-INR
INR: 1 (ref 0.00–1.49)
Prothrombin Time: 13.4 seconds (ref 11.6–15.2)

## 2011-12-16 SURGERY — ELECTROPHYSIOLOGY STUDY
Anesthesia: LOCAL

## 2011-12-16 SURGERY — LEFT HEART CATHETERIZATION WITH CORONARY ANGIOGRAM
Anesthesia: LOCAL

## 2011-12-16 MED ORDER — HEPARIN (PORCINE) IN NACL 2-0.9 UNIT/ML-% IJ SOLN
INTRAMUSCULAR | Status: AC
  Filled 2011-12-16: qty 2000

## 2011-12-16 MED ORDER — SODIUM CHLORIDE 0.9 % IJ SOLN
3.0000 mL | Freq: Two times a day (BID) | INTRAMUSCULAR | Status: DC
  Administered 2011-12-16 – 2011-12-17 (×2): 3 mL via INTRAVENOUS

## 2011-12-16 MED ORDER — NITROGLYCERIN 0.2 MG/ML ON CALL CATH LAB
INTRAVENOUS | Status: AC
  Filled 2011-12-16: qty 1

## 2011-12-16 MED ORDER — ACETAMINOPHEN 325 MG PO TABS: 650.0000 mg | ORAL_TABLET | ORAL | Status: DC | PRN

## 2011-12-16 MED ORDER — FENTANYL CITRATE 0.05 MG/ML IJ SOLN
INTRAMUSCULAR | Status: AC
  Filled 2011-12-16: qty 2

## 2011-12-16 MED ORDER — MIDAZOLAM HCL 2 MG/2ML IJ SOLN
INTRAMUSCULAR | Status: AC
  Filled 2011-12-16: qty 2

## 2011-12-16 MED ORDER — ONDANSETRON HCL 4 MG/2ML IJ SOLN: 4.0000 mg | Freq: Four times a day (QID) | INTRAMUSCULAR | Status: DC | PRN

## 2011-12-16 MED ORDER — SODIUM CHLORIDE 0.9 % IV SOLN
INTRAVENOUS | Status: AC
  Administered 2011-12-16: 150 mL/h via INTRAVENOUS

## 2011-12-16 MED ORDER — BUPIVACAINE HCL (PF) 0.25 % IJ SOLN
INTRAMUSCULAR | Status: AC
  Filled 2011-12-16: qty 30

## 2011-12-16 MED ORDER — SODIUM CHLORIDE 0.9 % IJ SOLN: 3.0000 mL | INTRAMUSCULAR | Status: DC | PRN

## 2011-12-16 MED ORDER — LIDOCAINE HCL (PF) 1 % IJ SOLN
INTRAMUSCULAR | Status: AC
  Filled 2011-12-16: qty 30

## 2011-12-16 MED ORDER — SODIUM CHLORIDE 0.9 % IV SOLN: 250.0000 mL | INTRAVENOUS | Status: DC | PRN

## 2011-12-16 MED ORDER — MIDAZOLAM HCL 5 MG/5ML IJ SOLN
INTRAMUSCULAR | Status: AC
  Filled 2011-12-16: qty 5

## 2011-12-16 MED FILL — Dextrose Inj 5%: INTRAVENOUS | Qty: 50 | Status: AC

## 2011-12-16 NOTE — Interval H&P Note (Signed)
History and Physical Interval Note:  12/16/2011 9:29 AM  John Berry  has presented today for surgery, with the diagnosis of relook  The various methods of treatment have been discussed with the patient and family. After consideration of risks, benefits and other options for treatment, the patient has consented to  Procedure(s) (LRB): LEFT HEART CATHETERIZATION WITH CORONARY ANGIOGRAM (N/A) as a surgical intervention .  The patients' history has been reviewed, patient examined, no change in status, stable for surgery.  I have reviewed the patients' chart and labs.  Questions were answered to the patient's satisfaction.    See my extensive notes on him.  I have watched him throughout the weekend, and discussed with him extensively the findings.  Recath has been recommended.   Shawnie Pons

## 2011-12-16 NOTE — Progress Notes (Signed)
     Patient: John Berry Date of Encounter: 12/16/2011, 8:25 AM Admit date: 12/13/2011     Subjective  John Berry reports intermittent chest tightness. He denies shortness of breath, palpitations or dizziness.   Objective   Filed Vitals:   12/16/11 0745  BP: 116/79  Pulse: 47  Temp: 97.5 F (36.4 C)  Resp: 18     Intake/Output Summary (Last 24 hours) at 12/16/11 0825 Last data filed at 12/16/11 0810  Gross per 24 hour  Intake 1003.63 ml  Output   2375 ml  Net -1371.37 ml    Physical Exam: General: Well developed, well appearing 60 year old male in no acute distress. Head: Normocephalic, atraumatic, sclera non-icteric, no xanthomas, nares are without discharge.  Neck: Supple. JVD not elevated. Lungs: Clear bilaterally to auscultation without wheezes, rales, or rhonchi. Breathing is unlabored. Heart: RRR S1 S2 without murmurs, rubs, or gallops.  Abdomen: Soft, non-distended. Extremities: No clubbing or cyanosis. No edema.  Distal pedal pulses are 2+ and equal bilaterally. Neuro: Alert and oriented X 3. Moves all extremities spontaneously. No focal deficits.  Inpatient Medications:  . aspirin  324 mg Oral Pre-Cath  . aspirin EC  81 mg Oral Daily  . atorvastatin  80 mg Oral q1800  . citalopram  10 mg Oral Daily  . clopidogrel  75 mg Oral Q breakfast  . diazepam  5 mg Oral On Call  . famotidine  40 mg Oral Daily  . metoprolol tartrate  12.5 mg Oral BID   . sodium chloride 1 mL/kg/hr (12/16/11 0350)  . eptifibatide Stopped (12/16/11 0810)    Labs:  Basename 12/15/11 0545 12/14/11 0214  NA 137 139  K 4.1 4.1  CL 102 101  CO2 24 26  GLUCOSE 99 102*  BUN 14 20  CREATININE 0.97 1.15  CALCIUM 8.8 8.6  MG -- --  PHOS -- --    Basename 12/15/11 0545 12/14/11 0215  WBC 5.8 6.8  NEUTROABS -- --  HGB 14.9 14.2  HCT 42.5 41.2  MCV 87.8 88.2  PLT 163 175    Basename 12/14/11 0214 12/13/11 2042 12/13/11 1245  CKTOTAL 134 149 192  CKMB 3.9 4.5* 5.1*    TROPONINI <0.30 -- --    Radiology/Studies: LHC 12/13/2011: Successful PCI of the mid RCA with reduction in stenosis from 80% to 0% with distal non flow limiting edge disruption. Moderate stenosis of the LAD and septal as noted. Reduced overall LV function with an inferior wall motion abnormality with severe hypokinesis, EF 45%.  Telemetry: normal sinus rhythm with intermittent narrow complex tachycardia, PSVT   Assessment and Plan  1. CAD - scheduled for relook LHC this AM 2. Syncope - ? arrhythmogenic; pending the results of his LHC this AM, may need an EPS +/- ICD this afternoon; discussed these recommendations again with the patient; Dr. Klein to follow  Signed, Berry, BROOKE PA-C  Cardiology/EP Attending  Patient seen and examined. Clinical data reviewed. He has had unexplained syncope and LV dysfunction with a segmental wall motion abnormality. I have discussed the risk/benefits/goals/expectations of EP Study and possible ICD implant if he has inducible monomorphic VT and he wishes to proceed.  John Berry M.D.   

## 2011-12-16 NOTE — CV Procedure (Signed)
   Cardiac Catheterization Procedure Note  Name: John Berry MRN: [id number] DOB: [birth year]  Procedure: Placement of catheters without left heart cath, coronary arteriography  Indication: The patient underwent stenting on Friday.  He had a widely patent stent, but a non obstructive edge dissection.  We restudied him to document vessel patency.  In addition, Dr. Graciela Husbands has decided that the patient needs an EP study.  With the possibility of a device placement  (ICD), we elected not to use more than clopidogrel, and with the unknown potential of hyporesponsiveness to clopidogrel in the background, we elected to cover him with glycoprotein inhibition in the absence of the availability  (awaiting FDA approval) of cangrelor.  We brought him back today to reassess the RCA only.  Eptifibatide was stopped earlier in the day.  He consented to proceed.     Procedural details: The left groin was prepped, draped, and anesthetized with 1% lidocaine. Using modified Seldinger technique, a 5 French sheath was introduced into the right femoral artery. RCA angiography was performed without complication and carefully reviewed. . Catheter exchanges were performed over a guidewire. There were no immediate procedural complications. The patient was transferred to the post catheterization recovery area for further monitoring.  Procedural Findings: Hemodynamics:   AO 120/78 (96) LV not done   Coronary angiography: Coronary dominance: right  Left mainstem: not done  Left anterior descending (LAD): not done  Left circumflex (LCx): not done  Right coronary artery (RCA): The RCA is a very large caliber vessel.  It is tortuous.   There is approximately 20% narrowing in the first bend, but this is smooth.  The stent in the mid vessel is widely patent, without interruption, and the distal margin of the stent near the bend is smooth with no visible evidence of edge disruption.  There is moderate plaquing in the  distal vessel, with some evidence of streaming as well due to the bend and large size.  This did not appear flow limiting.  The plaque in the PDA and the remainder of the distal vessels are unchanged.    Left ventriculography: Left ventricular systolic function is normal, LVEF is estimated at 55-65%, there is no significant mitral regurgitation    Final Conclusions:   1.  Continued patency of the RCA with a widely patent stent with smooth transitions.    Recommendations:  1.  DAPT, with check of P2Y12 off of glycoprotein inhibitors 2.  EPS per Dr. Graciela Husbands who feels we should proceed with study 3.  Treatment of SVT.  Reviewed angios with patients wife and discussed in detail.  Groin was held by me for ten minutes and then held by the staff thereafter.    Shawnie Pons 12/16/2011, 10:52 AM

## 2011-12-16 NOTE — H&P (View-Only) (Signed)
Subjective:  Some burning discomfort in epigastrium last night along with feeling bloated.  Feels fine now.    Objective:  Vital Signs in the last 24 hours: Temp:  [97.8 F (36.6 C)-99.4 F (37.4 C)] 98.3 F (36.8 C) (06/16 0335) Pulse Rate:  [63-87] 73  (06/15 2000) Resp:  [11-21] 17  (06/16 0000) BP: (120-142)/(75-86) 134/77 mmHg (06/16 0400) SpO2:  [94 %-99 %] 95 % (06/16 0335)  Intake/Output from previous day: 06/15 0701 - 06/16 0700 In: 995.9 [P.O.:570; I.V.:425.9] Out: 2625 [Urine:2625]   Physical Exam: General: Well developed, well nourished, in no acute distress. Head:  Normocephalic and atraumatic. Lungs: Clear to auscultation and percussion. Heart: Normal S1 and S2.  No murmur, rubs or gallops.  Pulses: Pulses normal in all 4 extremities. Extremities: No clubbing or cyanosis. No edema. Neurologic: Alert and oriented x 3.    Lab Results:  Basename 12/15/11 0545 12/14/11 0215  WBC 5.8 6.8  HGB 14.9 14.2  PLT 163 175    Basename 12/15/11 0545 12/14/11 0214  NA 137 139  K 4.1 4.1  CL 102 101  CO2 24 26  GLUCOSE 99 102*  BUN 14 20  CREATININE 0.97 1.15    Basename 12/14/11 0214  TROPONINI <0.30   Hepatic Function Panel No results found for this basename: PROT,ALBUMIN,AST,ALT,ALKPHOS,BILITOT,BILIDIR,IBILI in the last 72 hours No results found for this basename: CHOL in the last 72 hours No results found for this basename: PROTIME in the last 72 hours  Imaging: No results found.  EKG:  NSR.  Delay in  R wave progression likely from lead position.   Cardiac Studies:    Assessment/Plan:  Patient Active Hospital Problem List: Coronary artery disease (12/14/2011)   Assessment: stable.  For repeat cardiac cath tomorrow.     Plan: If stable, would prefer to switch to prasugrel or ticagrelor.  Would normally use them now if possible device was not in the picture.  Groin and platelet count are stable.   SVT (supraventricular tachycardia) (12/14/2011)  Assessment: telemetry reviewed.  Less spells at this point   Plan: continue metoprolol. Syncope (12/14/2011)   Assessment: Will discuss with SK   Plan: Depending on results of cath decide further plans.  No evidence of significant ventricular ectopy      Champayne Kocian, MD, FACC, FSCAI 12/15/2011, 7:40 AM   d 

## 2011-12-16 NOTE — Op Note (Signed)
Invasive EP study via the right femoral vein. No inducible VT or SVT. [id number].

## 2011-12-16 NOTE — Interval H&P Note (Signed)
History and Physical Interval Note:  12/16/2011 3:25 PM  John Berry  has presented today for surgery, with the diagnosis of SVT  The various methods of treatment have been discussed with the patient and family. After consideration of risks, benefits and other options for treatment, the patient has consented to  Procedure(s) (LRB): ELECTROPHYSIOLOGY STUDY (N/A) as a surgical intervention .  The patient's history has been reviewed, patient examined, no change in status, stable for surgery.  I have reviewed the patients' chart and labs.  Questions were answered to the patient's satisfaction.     Lewayne Bunting

## 2011-12-16 NOTE — H&P (View-Only) (Signed)
     Patient: John Berry Date of Encounter: 12/16/2011, 8:25 AM Admit date: 12/13/2011     Subjective  Mr. Wilmeth reports intermittent chest tightness. He denies shortness of breath, palpitations or dizziness.   Objective   Filed Vitals:   12/16/11 0745  BP: 116/79  Pulse: 47  Temp: 97.5 F (36.4 C)  Resp: 18     Intake/Output Summary (Last 24 hours) at 12/16/11 0825 Last data filed at 12/16/11 0810  Gross per 24 hour  Intake 1003.63 ml  Output   2375 ml  Net -1371.37 ml    Physical Exam: General: Well developed, well appearing 61 year old male in no acute distress. Head: Normocephalic, atraumatic, sclera non-icteric, no xanthomas, nares are without discharge.  Neck: Supple. JVD not elevated. Lungs: Clear bilaterally to auscultation without wheezes, rales, or rhonchi. Breathing is unlabored. Heart: RRR S1 S2 without murmurs, rubs, or gallops.  Abdomen: Soft, non-distended. Extremities: No clubbing or cyanosis. No edema.  Distal pedal pulses are 2+ and equal bilaterally. Neuro: Alert and oriented X 3. Moves all extremities spontaneously. No focal deficits.  Inpatient Medications:  . aspirin  324 mg Oral Pre-Cath  . aspirin EC  81 mg Oral Daily  . atorvastatin  80 mg Oral q1800  . citalopram  10 mg Oral Daily  . clopidogrel  75 mg Oral Q breakfast  . diazepam  5 mg Oral On Call  . famotidine  40 mg Oral Daily  . metoprolol tartrate  12.5 mg Oral BID   . sodium chloride 1 mL/kg/hr (12/16/11 0350)  . eptifibatide Stopped (12/16/11 0810)    Labs:  [facility] 12/15/11 0545 12/14/11 0214  NA 137 139  K 4.1 4.1  CL 102 101  CO2 24 26  GLUCOSE 99 102*  BUN 14 20  CREATININE 0.97 1.15  CALCIUM 8.8 8.6  MG -- --  PHOS -- --    Basename 12/15/11 0545 12/14/11 0215  WBC 5.8 6.8  NEUTROABS -- --  HGB 14.9 14.2  HCT 42.5 41.2  MCV 87.8 88.2  PLT 163 175    Basename 12/14/11 0214 12/13/11 2042 12/13/11 1245  CKTOTAL 134 149 192  CKMB 3.9 4.5* 5.1*    TROPONINI <0.30 -- --    Radiology/Studies: LHC 12/13/2011: Successful PCI of the mid RCA with reduction in stenosis from 80% to 0% with distal non flow limiting edge disruption. Moderate stenosis of the LAD and septal as noted. Reduced overall LV function with an inferior wall motion abnormality with severe hypokinesis, EF 45%.  Telemetry: normal sinus rhythm with intermittent narrow complex tachycardia, PSVT   Assessment and Plan  1. CAD - scheduled for relook LHC this AM 2. Syncope - ? arrhythmogenic; pending the results of his LHC this AM, may need an EPS +/- ICD this afternoon; discussed these recommendations again with the patient; Dr. Graciela Husbands to follow  Signed, EDMISTEN, [place]  Cardiology/EP Attending  Patient seen and examined. Clinical data reviewed. He has had unexplained syncope and LV dysfunction with a segmental wall motion abnormality. I have discussed the risk/benefits/goals/expectations of EP Study and possible ICD implant if he has inducible monomorphic VT and he wishes to proceed.  Lewayne Bunting M.D.

## 2011-12-17 DIAGNOSIS — R55 Syncope and collapse: Secondary | ICD-10-CM

## 2011-12-17 LAB — PLATELET INHIBITION P2Y12: Platelet Function  P2Y12: 9 [PRU] — ABNORMAL LOW (ref 194–418)

## 2011-12-17 MED ORDER — CITALOPRAM HYDROBROMIDE 10 MG PO TABS: 10.0000 mg | ORAL_TABLET | Freq: Every day | ORAL | Status: DC

## 2011-12-17 MED ORDER — CLOPIDOGREL BISULFATE 75 MG PO TABS
75.0000 mg | ORAL_TABLET | Freq: Every day | ORAL | Status: DC
  Administered 2011-12-17: 75 mg via ORAL

## 2011-12-17 MED ORDER — CLOPIDOGREL BISULFATE 75 MG PO TABS: 75.0000 mg | ORAL_TABLET | Freq: Every day | ORAL | Status: DC

## 2011-12-17 MED ORDER — METOPROLOL TARTRATE 25 MG PO TABS: 12.5000 mg | ORAL_TABLET | Freq: Two times a day (BID) | ORAL | Status: DC

## 2011-12-17 NOTE — Progress Notes (Signed)
     Patient: John Berry Date of Encounter: 12/17/2011, 8:26 AM Admit date: 12/13/2011     Subjective  Mr. John Berry has no complaints this AM. He denies chest pain, shortness of breath, palpitations or dizziness. He has been ambulating without difficulty.   Objective   Filed Vitals:   12/17/11 0800  BP: 126/77  Pulse: 65  Temp: 98.2  Resp: 18  O2 sat: 95%    Intake/Output Summary (Last 24 hours) at 12/17/11 0826 Last data filed at 12/17/11 0000  Gross per 24 hour  Intake 1637.6 ml  Output   2350 ml  Net -712.4 ml    Physical Exam: General: Well developed, well appearing 61 year old male in no acute distress. Head: Normocephalic, atraumatic, sclera non-icteric, no xanthomas, nares are without discharge.  Neck: Supple. JVD not elevated. Lungs: Clear bilaterally to auscultation without wheezes, rales, or rhonchi. Breathing is unlabored. Heart: RRR S1 S2 without murmurs, rubs, or gallops.  Abdomen: Soft, non-distended. Extremities: No clubbing or cyanosis. No edema.  Distal pedal pulses are 2+ and equal bilaterally. Neuro: Alert and oriented X 3. Moves all extremities spontaneously. No focal deficits. Psych:  Responds to questions appropriately with a normal affect.  Inpatient Medications:  . aspirin EC  81 mg Oral Daily  . atorvastatin  80 mg Oral q1800  . bupivacaine      . citalopram  10 mg Oral Daily  . diazepam  5 mg Oral On Call  . famotidine  40 mg Oral Daily  . metoprolol tartrate  12.5 mg Oral BID   Labs:  [facility] 12/15/11 0545  NA 137  K 4.1  CL 102  CO2 24  GLUCOSE 99  BUN 14  CREATININE 0.97  CALCIUM 8.8  MG --  PHOS --    Basename 12/16/11 0950 12/15/11 0545  WBC 4.4 5.8  NEUTROABS -- --  HGB 14.8 14.9  HCT 43.0 42.5  MCV 88.7 87.8  PLT 160 163    Telemetry: normal sinus rhythm EP study 12/16/2011: no inducible VT or SVT Relook LHC 12/16/2011: patent RCA stent site   Assessment and Plan  1. Syncope - unexplained; no driving x  1 month with neg eval 2. Negative EP study - no inducible VT or SVT; no indication for ICD at this time 3. CAD - stable; per Dr. Riley Kill  Dr. Graciela Husbands to see and make further recommendations if needed.  Discharge on Pravastatin, plavix metoprolol asa and citolopram   F/u TS 2 weeks  Will need FLP F/u sk 6weeks Signed, EDMISTEN, BROOKE PA-C Olin Hauser

## 2011-12-17 NOTE — Discharge Instructions (Signed)
Keep procedure site clean & dry. If you notice increased pain, swelling, bleeding or pus, call or return!  You may shower, but no soaking baths/hot tubs/pools for 1 week. No driving until cleared by cardiology. No lifting over 5 lbs for 1 week. No sexual activity for 1 week.

## 2011-12-17 NOTE — Progress Notes (Signed)
Utilization Review Completed.  Koi Yarbro, [place] T  12/17/2011

## 2011-12-17 NOTE — Discharge Summary (Signed)
CARDIOLOGY DISCHARGE SUMMARY    Patient ID: John Berry,  MRN: [id number], DOB/AGE: [birth year] 61 y.o.  Admit date: 12/13/2011 Discharge date: 12/17/2011  Primary Care Physician: Earl Lites, MD Primary Cardiologist: Berton Mount, MD  Primary Discharge Diagnosis:  1. Coronary artery disease 2. Negative EP study 3. Syncope  Secondary Discharge Diagnoses:  1. HTN 2. Hyperlipidemia  Procedures This Admission:  1. Left heart catheterization 12/13/2011   - Successful PCI of the mid RCA with reduction in stenosis from 80% to 0% with distal non flow limiting edge disruption. Moderate stenosis of the LAD and septal as noted. Reduced overall LV function with an inferior wall motion abnormality with severe hypokinesis, EF 45%.    - Coronary dominance: right. Left mainstem: There is no obstruction and the vessel is free of disease. Left anterior descending (LAD): The vessel courses to the apex. It provides the apex. There is a focal 70% lesion just after a severely diseased septal perforating vessel. The septal has tandem 80-90 lesions. There is then a tiny diagonal, non graftable, with a 90% lesion ostial. The remainder of the LAD is smooth without critical disease and is graftable. Left circumflex (LCx): Large with two large marginals. There is minor irregularity but no critical disease. There is a tiny av vessel. 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 In the mid portion of the mid vessel is a focal 80% stenosis. The distal vessel courses distally and is large in caliber. The PDA has a mid 70% lesion. The PLA does not have critical disease. After stenting, there is no residual stenosis at the lesion site. There is a linear contrast hang up extending proximally at the distal edge of the stent. There is perhaps 10-20% narrowing just distal to the stent, but with good distal flow. Left ventriculography: Left ventricular  systolic function is normal, LVEF is estimated at 45%, there is no significant mitral regurgitation. The inferior base is severely hypokinetic. There is mild anterolateral hypokinesis.   2. Relook left heart catheterization 12/16/2011 Patent RCA stent site; CAD unchanged  3. Electrophysiology study 12/16/2011  Negative - no inducible VT or SVT  History and Hospital Course:  John Berry is a 61 year old gentleman with HTN and hyperlipidemia who presented as an outpatient for evaluation of chest pain. He had been experiencing intermittent chest tightness x 3 months. He underwent an outpatient nuclear stress test which was abnormal. He then underwent definitive evaluation with left heart catheterization 12/13/2011 (details outlined above0 which revealed RCA stenosis which was successfully treated with PCI. Following PCI, there was 0% residual stenosis and TIMI-3 flow. Final angiography confirmed a good result but there was concern over a distal edge disruption that was non flow limiting. Dr. Riley Kill kept the patient in the lab for a considerable period and additional views were obtained. He attempted to pass an IVUS catheter but it would not navigate the proximal stent. Dr. Riley Kill then called Dr. [place] to the lab to review the films and assist with decision regarding whether or an additional stent was needed. The vessel remained patent with TIMI 3 flow throughout. After thoughtful consideration, Dr. Riley Kill elected not to attempt to pass a second stent. John Berry tolerated the procedure well and there were no complications; however, given these findings, he was observed as an inpatient over the weekend and underwent a relook LHC on 12/16/2011 which revealed a patent RCA stent site and stable CAD elsewhere. Due to  unexplained syncope in the setting of segmental wall motion abnormalities and LV dysfunction, he underwent an invasive EP study on 12/16/2011 which was negative for inducible VT or SVT. He  tolerated this procedure well with no complications. He is now ambulating without difficulty. He remains hemodynamically stable. Of note, citalopram was added for treatment of anxiety. He was also started on Plavix and metoprolol for treatment of CAD. He will continue aspirin and pravastatin. His PRU is 9, which was measured today, more than 24 hours after glycoprotein washout. Dr. Riley Kill feels the washout period is likely not quite long enough and recommended he continue clopidogrel based on this at 75mg  per day and recheck PRU again in 48 hours, and possibly 96 hours, to monitor for clopidogrel non-responsiveness. As long as he remains a responder, based on the anatomic result, we will continue Plavix. He has been seen, examined and deemed stable for discharge today by Dr. Berton Mount.  Discharge Vitals: Blood pressure 143/92, pulse 70, temperature 98.2 F (36.8 C), temperature source Oral, resp. rate 19, height 6' (1.829 m), weight 227 lb 1.2 oz (103 kg), SpO2 95.00%.   Labs: Lab Results  Component Value Date   WBC 4.4 12/16/2011   HGB 14.8 12/16/2011   HCT 43.0 12/16/2011   MCV 88.7 12/16/2011   PLT 160 12/16/2011    Lab 12/15/11 0545  NA 137  K 4.1  CL 102  CO2 24  BUN 14  CREATININE 0.97  CALCIUM 8.8  PROT --  BILITOT --  ALKPHOS --  ALT --  AST --  GLUCOSE 99   Lab Results  Component Value Date   CKTOTAL 134 12/14/2011   CKMB 3.9 12/14/2011   TROPONINI <0.30 12/14/2011     Disposition:  The patient is being discharged in stable condition.  Follow-up:  Follow up with [facility], 1st floor on 12/19/2011. (Please have labs drawn on Thursday, June 20th (P2Y12 test))    Contact information:   Call [phone] for directions if needed. You do not need an appointment for labs.    Follow up with Shawnie Pons, MD on 12/31/2011. (At 2:30 PM)    Contact information:   [address]  [address] [place] [address] [phone]    Follow up with  Sherryl Manges, MD on 02/13/2012. (At 9:00 AM)    Contact information:   [address] [place] [address] [phone]    Follow up with Earl Lites, MD on 02/11/2012. (At 8:45 AM) for hospital follow-up and management of anxiety   Contact information:   [address] [place] [address] [phone]   Discharge Medications:   START taking these medications     citalopram 10 MG tablet   Commonly known as: CELEXA   Take 1 tablet (10 mg total) by mouth daily.      clopidogrel 75 MG tablet   Commonly known as: PLAVIX   Take 1 tablet (75 mg total) by mouth daily with breakfast.      metoprolol tartrate 25 MG tablet   Commonly known as: LOPRESSOR   Take 0.5 tablets (12.5 mg total) by mouth 2 (two) times daily.       CONTINUE taking these medications     ALPRAZolam 1 MG tablet   Commonly known as: XANAX      aspirin 81 MG tablet      pravastatin 40 MG tablet   Commonly known as: PRAVACHOL  Duration of Discharge Encounter: Greater than 30 minutes including physician time.  Signed, Rick Duff, PA-C 12/17/2011, 11:11 AM

## 2011-12-17 NOTE — Progress Notes (Signed)
Patient had PRU measured today more than 24 hours after glycoprotein washout  (This was used in [facility]).  PRU was 9.  Washout period is likely not quite long enough to know where we are.   Will continue clopidogrel based on this at 75mg  per day, and recheck PRU again in 48 hours, and possibly 96h to monitor for clopidogrel non-responsiveness.  As long as he remains a responder, based on the anatomic result, we will continue this as primary treatment.     Bonnee Quin MD, [facility]

## 2011-12-17 NOTE — Progress Notes (Signed)
Patient being discharged with medication and discharge instructions.  

## 2011-12-17 NOTE — Progress Notes (Signed)
CARDIAC REHAB PHASE I   PRE:  Rate/Rhythm: 64SR  BP:  Supine: 136/77  Sitting:   Standing:    SaO2:   MODE:  Ambulation: 680 ft   POST:  Rate/Rhythem: 81SR  BP:  Supine: 149/80  Sitting:   Standing:    SaO2:  0932-1034 Pt walked 680 ft on RA with steady gait. Denied dizziness or CP. Tolerated well. Education completed. Permission given to refer to [facility] 2.  Duanne Limerick

## 2011-12-17 NOTE — Op Note (Signed)
NAMEGOLDEN, GILREATH NO.:  [id number]  MEDICAL RECORD NO.:  [id number]  LOCATION:  2926                         FACILITY:  [facility]  PHYSICIAN:  Doylene Canning. Ladona Ridgel, MD    DATE OF BIRTH:  August 13, 1950  DATE OF PROCEDURE:  12/16/2011 DATE OF DISCHARGE:                              OPERATIVE REPORT   SURGEON:  Doylene Canning. Ladona Ridgel, MD  PROCEDURE PERFORMED:  Invasive electrophysiologic study.  INDICATION:  Unexplained syncope in the setting of ischemic heart disease with moderate left ventricular dysfunction.  INTRODUCTION:  The patient is a very pleasant 61 year old man who has known coronary artery disease and is status post recent stenting of the right coronary artery.  He has an inferior scar.  His ejection fraction is 45%.  Because of unexplained syncope, the patient is now referred for invasive EP study to see if he has inducible ventricular tachycardia.  PROCEDURE:  After informed consent was obtained, the patient was taken to the Diagnostic EP Lab in the fasting state.  After usual preparation and draping, intravenous fentanyl and midazolam were given for sedation. A 6-French quadripolar catheter was inserted percutaneously into the right femoral vein and advanced to the right ventricle.  A 6-French quadripolar catheter was inserted percutaneously in the right femoral vein and advanced to the His bundle region.  Rapid ventricular pacing was carried out from the right ventricle demonstrating VA dissociation at 480 milliseconds.  Additional decrements were carried out down to 280 milliseconds with no inducible VT.  Next, programmed ventricular stimulation was carried out from the right ventricle at base drive cycle length of 161 milliseconds.  The S1-S2 interval was stepwise decreased down to 220 milliseconds where the ventricular refractoriness was observed.  Additional programmed ventricular stimulation was then carried out utilizing S1-S2, S1-S2-S3, and S1-S2-S3-S4  stimuli with the S1-S2, S2-S3, and S3-S4 interval were stepwise decreased down to ventricular refractoriness.  During programmed ventricular stimulation, there were no inducible ventricular arrhythmias.  Next, programmed ventricular stimulation was carried out at base drive cycle length of 096 milliseconds.  Again S1-S2, S1-S2-S3, and S1-S2-S3-S4 stimuli were delivered with the S1-S2; S2-S3; and S3-S4 intervals were stepwise decreased down to ventricular refractoriness.  Finally, long-short coupled intervals were carried out at an S1-S2 coupling interval of 400/600, and again, there was no inducible VT despite an aggressive pacing protocol.  At this point, the ventricular catheter was maneuvered into the right atrium and rapid atrial pacing was carried out from the right atrium and stepwise decreased down to 330 milliseconds where AV Wenckebach was observed.  During rapid atrial pacing, the PR interval was greater than the RR interval, but there was no inducible SVT. Programmed atrial stimulation was carried out from the right atrium at base drive cycle length of 045 milliseconds.  The S1-S2 interval was stepwise decreased down to 220 milliseconds where atrial refractoriness was observed.  During the programmed atrial stimulation, there were no inducible sustained arrhythmias.  The patient did have nonsustained atrial fibrillation with programmed atrial stimulation of uncertain clinical significance.  At this point, the catheters were removed, hemostasis was assured, and the patient was returned to his room in satisfactory condition.  COMPLICATIONS:  There were no immediate  procedure complications.  RESULTS:  This demonstrates no inducible VT or SVT.  The patient did have nonsustained atrial fibrillation induced with programmed atrial stimulation.  He also had a PR interval greater than the RR interval, but no inducible SVT.     Doylene Canning. Ladona Ridgel, MD     GWT/MEDQ  D:  12/16/2011   T:  12/17/2011  Job:  [id number]

## 2011-12-19 ENCOUNTER — Telehealth: Payer: Self-pay | Admitting: *Deleted

## 2011-12-19 ENCOUNTER — Telehealth: Payer: Self-pay | Admitting: Cardiology

## 2011-12-19 MED ORDER — NITROGLYCERIN 0.4 MG SL SUBL: 0.4000 mg | SUBLINGUAL_TABLET | SUBLINGUAL | Status: DC | PRN

## 2011-12-19 NOTE — Telephone Encounter (Signed)
New Problem:    Patient's wife called in to give the latest BP readings for her husband.  Please call back.

## 2011-12-19 NOTE — Telephone Encounter (Signed)
Patient's wife states last night bout 8:00 PM a friend nurse student came in to their house, took pt's BP and it was 154/98 on right arm and 162/118 let arm, and that after 15 minutes of taken his BP medication his BP was normal. Pt's wife was made aware that it take about two hours for the medication to take effect, so the readings may  not be accurate. Pt and wife will go to [facility] to check pt's BP and she will cal the office back.

## 2011-12-19 NOTE — Telephone Encounter (Signed)
Patient's wife states that prior d/C from the hospital the MD said that pt was going to get a NTG sublingual prescription, but when wife went to get the medications the NTG prescription was not included. A prescription for Nitrostat 0.4 mg SL was send th [facility] pharmacy. Note: according to the discharge note  On 12/17/11  prior d/C  pt's BP was 143/92 and 136/77. Patient's wife will call back the afternoon with BP reading.

## 2011-12-19 NOTE — Telephone Encounter (Signed)
[facility] CALLED  NEEDING LAB ORDER ON PT  AFTER REVIEWING PT'S RECORDS  APPEARS PT NEEDS  FLP   ORDER FAXED TO  LAB FAX NUMBER [phone]

## 2011-12-25 NOTE — Telephone Encounter (Signed)
The pt is scheduled to see Dr Riley Kill on 12/31/11.  Will address BP at that time.

## 2011-12-31 ENCOUNTER — Ambulatory Visit (INDEPENDENT_AMBULATORY_CARE_PROVIDER_SITE_OTHER): Payer: PRIVATE HEALTH INSURANCE | Admitting: Cardiology

## 2011-12-31 ENCOUNTER — Encounter: Payer: Self-pay | Admitting: Cardiology

## 2011-12-31 VITALS — BP 145/80 | HR 52 | Ht 72.0 in | Wt 230.0 lb

## 2011-12-31 DIAGNOSIS — R55 Syncope and collapse: Secondary | ICD-10-CM

## 2011-12-31 DIAGNOSIS — E785 Hyperlipidemia, unspecified: Secondary | ICD-10-CM

## 2011-12-31 DIAGNOSIS — I2510 Atherosclerotic heart disease of native coronary artery without angina pectoris: Secondary | ICD-10-CM

## 2011-12-31 NOTE — Progress Notes (Signed)
HPI:  Patient returns in followup. He's had a recent complicated hospital course. He was seen initially by Dr. Graciela Husbands after presenting to the emergency room with chest pain. He also provide a history of recent syncope or driving his car. He then underwent exercise stress testing, and had no angina or significant ST segment changes but did have a moderate perfusion defect predominantly involving the inferior myocardial territory. He underwent cardiac catheterization which demonstrated a significant high-grade lesion of a large caliber right coronary artery, and also a moderately severe stenosis involving the left anterior descending artery. He underwent stenting of the right coronary artery. This was done in the above trial. He had a mild edge disruption, but he was kept in over the weekend and repeat angiography demonstrated a very smooth result with widely patent vessel and no evidence of obstruction. He underwent EP study which did not demonstrate inducible ventricular arrhythmia. Dr. Graciela Husbands cleared him to drive. He was discharged from the hospital. Since discharge she is been getting along well. He has not had any major problems he does think he has some indigestion, possibly related to the Plavix. Otherwise he is been unremarkable  Current Outpatient Prescriptions  Medication Sig Dispense Refill  . ALPRAZolam (XANAX) 1 MG tablet Take 1 mg by mouth 2 (two) times daily as needed. 1/2 tablet ,For anxiety      . aspirin 81 MG tablet Take 81 mg by mouth daily.      . citalopram (CELEXA) 10 MG tablet Take 1 tablet (10 mg total) by mouth daily.  30 tablet  1  . clopidogrel (PLAVIX) 75 MG tablet Take 1 tablet (75 mg total) by mouth daily with breakfast.  30 tablet  4  . metoprolol tartrate (LOPRESSOR) 25 MG tablet Take 0.5 tablets (12.5 mg total) by mouth 2 (two) times daily.  60 tablet  4  . nitroGLYCERIN (NITROSTAT) 0.4 MG SL tablet Place 1 tablet (0.4 mg total) under the tongue every 5 (five) minutes as  needed for chest pain.  25 tablet  3  . pravastatin (PRAVACHOL) 40 MG tablet Take 40 mg by mouth daily.        No Known Allergies  Past Medical History  Diagnosis Date  . Diverticulosis     colon  . Hypertension   . Hyperlipidemia   . OSA (obstructive sleep apnea)   . Depression   . Trigeminal neuralgia 4/11    Microvascular decompression    Past Surgical History  Procedure Date  . Craniotomy 10/25/2009    for trigeminal neuralgia  . Spine surgery     lumbar x 2  . Achilles tendon repair     x 3  . Nasal sinus surgery   . Back surgery     Family History  Problem Relation Age of Onset  . Hypertension Mother   . Heart disease Mother 3  . Heart disease Brother 31    History   Social History  . Marital Status: Married    Spouse Name: Arline Asp    Number of Children: 3  . Years of Education: N/A   Occupational History  . 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 Smoking status: Never Smoker   . Smokeless tobacco: Never Used  . Alcohol Use: No  . Drug Use: Not on file  . Sexually Active: Not on file   Other Topics Concern  . Not on file   Social History Narrative   Lives in [place], [place] with wife.  ROS: Please see the HPI.  All other systems reviewed and negative.  PHYSICAL EXAM:  BP 145/80  Pulse 52  Ht 6' (1.829 m)  Wt 230 lb (104.327 kg)  BMI 31.19 kg/m2  General: Well developed, well nourished, in no acute distress. Head:  Normocephalic and atraumatic. Neck: no JVD Lungs: Clear to auscultation and percussion. Heart: Normal S1 and S2.  No murmur, rubs or gallops.  Abdomen:  Normal bowel sounds; soft; non tender; no organomegaly Pulses: Pulses normal in all 4 extremities.  Groins actually look stable.  No bruit, and no large knot in left.  R looks normal.   Extremities: No clubbing or cyanosis. No edema. Neurologic: Alert and oriented x 3.  EKG:  SB, otherwise normal.    ASSESSMENT AND PLAN:

## 2011-12-31 NOTE — Patient Instructions (Addendum)
Your physician recommends that you schedule a follow-up appointment in: 6 WEEKS  

## 2012-01-01 ENCOUNTER — Other Ambulatory Visit: Payer: Self-pay | Admitting: Physician Assistant

## 2012-01-03 ENCOUNTER — Other Ambulatory Visit: Payer: Self-pay | Admitting: Physician Assistant

## 2012-01-04 ENCOUNTER — Other Ambulatory Visit: Payer: Self-pay | Admitting: *Deleted

## 2012-01-05 NOTE — Assessment & Plan Note (Signed)
Had high grade RCA disease.  Also has lesion in LAD but no ischemia in that territory.  Long discussion.  Will follow medically for now on aggressive treatment.  Multiple meds discussed with patient.  Continue current treatment.

## 2012-01-05 NOTE — Assessment & Plan Note (Signed)
Assessment per Dr. Graciela Husbands.

## 2012-01-05 NOTE — Assessment & Plan Note (Signed)
Will need to get these checked.

## 2012-01-16 ENCOUNTER — Encounter (HOSPITAL_COMMUNITY)
Admission: RE | Admit: 2012-01-16 | Discharge: 2012-01-16 | Disposition: A | Discharge status: Home / Self Care | Payer: PRIVATE HEALTH INSURANCE | Source: Ambulatory Visit | Attending: Cardiology | Admitting: Cardiology

## 2012-01-16 DIAGNOSIS — F411 Generalized anxiety disorder: Secondary | ICD-10-CM | POA: Insufficient documentation

## 2012-01-16 DIAGNOSIS — Z8249 Family history of ischemic heart disease and other diseases of the circulatory system: Secondary | ICD-10-CM | POA: Insufficient documentation

## 2012-01-16 DIAGNOSIS — Z9861 Coronary angioplasty status: Secondary | ICD-10-CM | POA: Insufficient documentation

## 2012-01-16 DIAGNOSIS — G4733 Obstructive sleep apnea (adult) (pediatric): Secondary | ICD-10-CM | POA: Insufficient documentation

## 2012-01-16 DIAGNOSIS — Z5189 Encounter for other specified aftercare: Secondary | ICD-10-CM | POA: Insufficient documentation

## 2012-01-16 DIAGNOSIS — K219 Gastro-esophageal reflux disease without esophagitis: Secondary | ICD-10-CM | POA: Insufficient documentation

## 2012-01-16 DIAGNOSIS — I498 Other specified cardiac arrhythmias: Secondary | ICD-10-CM | POA: Insufficient documentation

## 2012-01-16 DIAGNOSIS — E785 Hyperlipidemia, unspecified: Secondary | ICD-10-CM | POA: Insufficient documentation

## 2012-01-16 DIAGNOSIS — I2510 Atherosclerotic heart disease of native coronary artery without angina pectoris: Secondary | ICD-10-CM | POA: Insufficient documentation

## 2012-01-16 DIAGNOSIS — I10 Essential (primary) hypertension: Secondary | ICD-10-CM | POA: Insufficient documentation

## 2012-01-16 DIAGNOSIS — Z7982 Long term (current) use of aspirin: Secondary | ICD-10-CM | POA: Insufficient documentation

## 2012-01-16 NOTE — Progress Notes (Signed)
Cardiac Rehab Medication Review by a Pharmacist  Does the patient  feel that his/her medications are working for him/her?  yes  Has the patient been experiencing any side effects to the medications prescribed?  no  Does the patient measure his/her own blood pressure or blood glucose at home?  yes   Does the patient have any problems obtaining medications due to transportation or finances?   no  Understanding of regimen: good Understanding of indications: good Potential of compliance: good    Pharmacist comments: Pt medications reviewed, pt allergies reviewed, assessed pt baseline knowledge about medications and indications; also assessed compliance. Answered pt questions about indications and possible side effects to look out for.   Abran Duke, PharmD Clinical Pharmacist Phone: [phone] Pager: [phone] 01/16/2012 9:21 AM

## 2012-01-20 ENCOUNTER — Encounter (HOSPITAL_COMMUNITY)
Admission: RE | Admit: 2012-01-20 | Discharge: 2012-01-20 | Disposition: A | Discharge status: Home / Self Care | Payer: PRIVATE HEALTH INSURANCE | Source: Ambulatory Visit | Attending: Cardiology | Admitting: Cardiology

## 2012-01-20 ENCOUNTER — Encounter (HOSPITAL_COMMUNITY): Payer: Self-pay

## 2012-01-20 NOTE — Progress Notes (Signed)
Pt started cardiac rehab today.  Pt tolerated light exercise without difficulty.   Asymptomatic.  VSS, except resting heart rate during cool down-45.  Telemetry-NSR.  Pt oriented to exercise equipment and routine.  Understanding verbalized.  Will continue to monitor the patient throughout  the program.

## 2012-01-22 ENCOUNTER — Encounter (HOSPITAL_COMMUNITY)
Admission: RE | Admit: 2012-01-22 | Discharge: 2012-01-22 | Disposition: A | Discharge status: Home / Self Care | Payer: PRIVATE HEALTH INSURANCE | Source: Ambulatory Visit | Attending: Cardiology | Admitting: Cardiology

## 2012-01-23 ENCOUNTER — Encounter: Payer: Self-pay | Admitting: Emergency Medicine

## 2012-01-24 ENCOUNTER — Encounter (HOSPITAL_COMMUNITY)
Admission: RE | Admit: 2012-01-24 | Discharge: 2012-01-24 | Disposition: A | Discharge status: Home / Self Care | Payer: PRIVATE HEALTH INSURANCE | Source: Ambulatory Visit | Attending: Cardiology | Admitting: Cardiology

## 2012-01-24 NOTE — Progress Notes (Signed)
Reviewed home exercise with pt today.  Pt plans to walk and use exercise bike for exercise.  Reviewed THR, pulse, RPE, sign and symptoms, NTG use, and when to call 911 or MD.  Pt voiced understanding. Electronically signed by Harriett Sine MS on Friday January 24 2012 at [phone]

## 2012-01-27 ENCOUNTER — Encounter (HOSPITAL_COMMUNITY)
Admission: RE | Admit: 2012-01-27 | Discharge: 2012-01-27 | Disposition: A | Discharge status: Home / Self Care | Payer: PRIVATE HEALTH INSURANCE | Source: Ambulatory Visit | Attending: Cardiology | Admitting: Cardiology

## 2012-01-29 ENCOUNTER — Encounter (HOSPITAL_COMMUNITY)
Admission: RE | Admit: 2012-01-29 | Discharge: 2012-01-29 | Disposition: A | Discharge status: Home / Self Care | Payer: PRIVATE HEALTH INSURANCE | Source: Ambulatory Visit | Attending: Cardiology | Admitting: Cardiology

## 2012-01-31 ENCOUNTER — Encounter (HOSPITAL_COMMUNITY)
Admission: RE | Admit: 2012-01-31 | Discharge: 2012-01-31 | Disposition: A | Discharge status: Home / Self Care | Payer: PRIVATE HEALTH INSURANCE | Source: Ambulatory Visit | Attending: Cardiology | Admitting: Cardiology

## 2012-01-31 DIAGNOSIS — E785 Hyperlipidemia, unspecified: Secondary | ICD-10-CM | POA: Insufficient documentation

## 2012-01-31 DIAGNOSIS — I2510 Atherosclerotic heart disease of native coronary artery without angina pectoris: Secondary | ICD-10-CM | POA: Insufficient documentation

## 2012-01-31 DIAGNOSIS — G4733 Obstructive sleep apnea (adult) (pediatric): Secondary | ICD-10-CM | POA: Insufficient documentation

## 2012-01-31 DIAGNOSIS — Z5189 Encounter for other specified aftercare: Secondary | ICD-10-CM | POA: Insufficient documentation

## 2012-01-31 DIAGNOSIS — Z8249 Family history of ischemic heart disease and other diseases of the circulatory system: Secondary | ICD-10-CM | POA: Insufficient documentation

## 2012-01-31 DIAGNOSIS — F411 Generalized anxiety disorder: Secondary | ICD-10-CM | POA: Insufficient documentation

## 2012-01-31 DIAGNOSIS — Z9861 Coronary angioplasty status: Secondary | ICD-10-CM | POA: Insufficient documentation

## 2012-01-31 DIAGNOSIS — Z7982 Long term (current) use of aspirin: Secondary | ICD-10-CM | POA: Insufficient documentation

## 2012-01-31 DIAGNOSIS — I10 Essential (primary) hypertension: Secondary | ICD-10-CM | POA: Insufficient documentation

## 2012-01-31 DIAGNOSIS — K219 Gastro-esophageal reflux disease without esophagitis: Secondary | ICD-10-CM | POA: Insufficient documentation

## 2012-01-31 DIAGNOSIS — I498 Other specified cardiac arrhythmias: Secondary | ICD-10-CM | POA: Insufficient documentation

## 2012-02-03 ENCOUNTER — Encounter (HOSPITAL_COMMUNITY): Payer: PRIVATE HEALTH INSURANCE

## 2012-02-05 ENCOUNTER — Encounter (HOSPITAL_COMMUNITY): Payer: PRIVATE HEALTH INSURANCE

## 2012-02-07 ENCOUNTER — Encounter (HOSPITAL_COMMUNITY): Admission: RE | Admit: 2012-02-07 | Payer: PRIVATE HEALTH INSURANCE | Source: Ambulatory Visit

## 2012-02-10 ENCOUNTER — Encounter (HOSPITAL_COMMUNITY)
Admission: RE | Admit: 2012-02-10 | Discharge: 2012-02-10 | Disposition: A | Discharge status: Home / Self Care | Payer: PRIVATE HEALTH INSURANCE | Source: Ambulatory Visit | Attending: Cardiology | Admitting: Cardiology

## 2012-02-11 ENCOUNTER — Encounter: Payer: Self-pay | Admitting: Emergency Medicine

## 2012-02-11 ENCOUNTER — Ambulatory Visit (INDEPENDENT_AMBULATORY_CARE_PROVIDER_SITE_OTHER): Payer: PRIVATE HEALTH INSURANCE | Admitting: Emergency Medicine

## 2012-02-11 VITALS — BP 116/84 | HR 48 | Temp 97.6°F | Resp 16 | Ht 70.5 in | Wt 222.8 lb

## 2012-02-11 DIAGNOSIS — F32A Depression, unspecified: Secondary | ICD-10-CM

## 2012-02-11 DIAGNOSIS — F341 Dysthymic disorder: Secondary | ICD-10-CM

## 2012-02-11 DIAGNOSIS — F419 Anxiety disorder, unspecified: Secondary | ICD-10-CM

## 2012-02-11 DIAGNOSIS — I2510 Atherosclerotic heart disease of native coronary artery without angina pectoris: Secondary | ICD-10-CM

## 2012-02-11 DIAGNOSIS — F329 Major depressive disorder, single episode, unspecified: Secondary | ICD-10-CM

## 2012-02-11 MED ORDER — ALPRAZOLAM 1 MG PO TABS: ORAL_TABLET | ORAL | Status: DC

## 2012-02-11 NOTE — Progress Notes (Signed)
  Subjective:    Patient ID: John Berry, male    DOB: 12-Jun-1951, 61 y.o.   MRN: [id number]  HPI patient in to followup depression. Patient is doing well status post recent stent placement for coronary disease. He was evaluated by Dr. Graciela Husbands and had stent placement by Dr. Reyes Ivan he was also seen by Dr. Ladona Ridgel for electrophysiologic evaluation. Patient recently lost his job and is trying to stay active  with doing different  volunteer work.    Review of Systems     Objective:   Physical Exam HEENT exam is unremarkable neck supple chest clear cardiac exam is unremarkable.       Assessment & Plan:  Patient is stable at present he is very depressed at recent job loss. He has been to counseling and psychiatrists in the past but does not want to do that at the present time.

## 2012-02-12 ENCOUNTER — Encounter (HOSPITAL_COMMUNITY)
Admission: RE | Admit: 2012-02-12 | Discharge: 2012-02-12 | Disposition: A | Discharge status: Home / Self Care | Payer: PRIVATE HEALTH INSURANCE | Source: Ambulatory Visit | Attending: Cardiology | Admitting: Cardiology

## 2012-02-12 NOTE — Progress Notes (Signed)
John Berry 61 y.o. male Nutrition Note Spoke with pt.  Nutrition Plan, Nutrition Survey, and cholesterol goals reviewed with pt. Pt is following Step 1  of the Therapeutic Lifestyle Changes diet. Weight loss tips discussed. Pt reports he eats out 4-5 times a week. 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 Pt plans on attending the Nutrition I and II classes and asked for copies of the materials to review.  Nutrition Diagnosis   Food-and nutrition-related knowledge deficit related to lack of exposure to information as related to diagnosis of: ? CVD    Obesity related to excessive energy intake as evidenced by a BMI of 31.8  Nutrition RX/ Estimated Daily Nutrition Needs for: wt loss  1650-2150 Kcal, 45-55 gm fat, 10-14 gm sat fat, 1.6-2.1 gm trans-fat, <1500 mg sodium   Nutrition Intervention   Pt's individual nutrition plan including cholesterol goals reviewed with pt.   Benefits of adopting Therapeutic Lifestyle Changes discussed when Medficts reviewed.   Pt to attend the Portion Distortion class   Pt to attend the  ? Nutrition I class                          ? Nutrition II class   Pt given handouts for: ? wt loss ? Nutrition I class ? Nutrition II class ? Dining Out Guide for Heart Health     Continue client-centered nutrition education by RD, as part of interdisciplinary care. Goal(s)   Pt to identify and limit food sources of saturated fat, trans fat, and cholesterol   Pt to identify food quantities necessary to achieve: ? wt loss to a goal wt of 204-216 lb (92.5-97.9 kg) at graduation from cardiac rehab.  Monitor and Evaluate progress toward nutrition goal with team.

## 2012-02-13 ENCOUNTER — Ambulatory Visit (INDEPENDENT_AMBULATORY_CARE_PROVIDER_SITE_OTHER): Payer: PRIVATE HEALTH INSURANCE | Admitting: Internal Medicine

## 2012-02-13 ENCOUNTER — Encounter: Payer: Self-pay | Admitting: Internal Medicine

## 2012-02-13 ENCOUNTER — Other Ambulatory Visit: Payer: Self-pay | Admitting: Cardiology

## 2012-02-13 VITALS — BP 125/86 | HR 58 | Ht 72.0 in | Wt 224.8 lb

## 2012-02-13 DIAGNOSIS — I2510 Atherosclerotic heart disease of native coronary artery without angina pectoris: Secondary | ICD-10-CM

## 2012-02-13 DIAGNOSIS — R55 Syncope and collapse: Secondary | ICD-10-CM

## 2012-02-13 DIAGNOSIS — E785 Hyperlipidemia, unspecified: Secondary | ICD-10-CM

## 2012-02-13 MED ORDER — CITALOPRAM HYDROBROMIDE 10 MG PO TABS: 10.0000 mg | ORAL_TABLET | Freq: Every day | ORAL | Status: DC

## 2012-02-13 NOTE — Progress Notes (Signed)
  HPI  John Berry is a 61 y.o. male Seen in followup for syncope as well as chest pain with an abnormal Myoview. This prompted in June 2013 catheterization demonstrating high-grade RCA lesion for which he underwent stenting. He also had a moderate LAD lesion for which medical therapy will be aggressively pursued. Left ventricular function was normal.  Because of syncope he underwent EP testing which was negative.  He is doing well with some non specific not necessairyly exertional right sided chest pain with the most reliable trigger being eating onions  Past Medical History  Diagnosis Date  . Diverticulosis     colon  . Hypertension   . Hyperlipidemia   . OSA (obstructive sleep apnea)   . Depression   . Trigeminal neuralgia 4/11    Microvascular decompression    Past Surgical History  Procedure Date  . Craniotomy 10/25/2009    for trigeminal neuralgia  . Spine surgery     lumbar x 2  . Achilles tendon repair     x 3  . Nasal sinus surgery   . Back surgery     Current Outpatient Prescriptions  Medication Sig Dispense Refill  . ALPRAZolam (XANAX) 1 MG tablet Take one half tablet twice daily  30 tablet  5  . aspirin 81 MG tablet Take 81 mg by mouth daily.      . citalopram (CELEXA) 10 MG tablet Take 1 tablet (10 mg total) by mouth daily.  30 tablet  1  . clopidogrel (PLAVIX) 75 MG tablet Take 1 tablet (75 mg total) by mouth daily with breakfast.  30 tablet  4  . famotidine (PEPCID) 20 MG tablet Take 20 mg by mouth daily.      . metoprolol tartrate (LOPRESSOR) 25 MG tablet Take 0.5 tablets (12.5 mg total) by mouth 2 (two) times daily.  60 tablet  4  . nitroGLYCERIN (NITROSTAT) 0.4 MG SL tablet Place 1 tablet (0.4 mg total) under the tongue every 5 (five) minutes as needed for chest pain.  25 tablet  3  . pravastatin (PRAVACHOL) 40 MG tablet Take 40 mg by mouth daily.        No Known Allergies  Review of Systems negative except from HPI and PMH  Physical Exam BP  125/86  Pulse 58  Ht 6' (1.829 m)  Wt 224 lb 12.8 oz (101.969 kg)  BMI 30.49 kg/m2 Well developed and nourished in no acute distress HENT normal Neck supple with JVP-flat Clear Regular rate and rhythm, no murmurs or gallops Abd-soft with active BS No Clubbing cyanosis edema Skin-warm and dry A & Oriented  Grossly normal sensory and motor function    Assessment and  Plan

## 2012-02-13 NOTE — Patient Instructions (Signed)
Your physician recommends that you return for lab work tomorrow: lipid/liver  Your physician recommends that you schedule a follow-up appointment in: 1-2 weeks- Thursday 03/05/12 at 4:00 pm with Dr. Riley Kill.

## 2012-02-14 ENCOUNTER — Encounter (HOSPITAL_COMMUNITY)
Admission: RE | Admit: 2012-02-14 | Discharge: 2012-02-14 | Disposition: A | Discharge status: Home / Self Care | Payer: PRIVATE HEALTH INSURANCE | Source: Ambulatory Visit | Attending: Cardiology | Admitting: Cardiology

## 2012-02-14 ENCOUNTER — Other Ambulatory Visit (INDEPENDENT_AMBULATORY_CARE_PROVIDER_SITE_OTHER): Payer: PRIVATE HEALTH INSURANCE

## 2012-02-14 DIAGNOSIS — I2510 Atherosclerotic heart disease of native coronary artery without angina pectoris: Secondary | ICD-10-CM

## 2012-02-14 LAB — HEPATIC FUNCTION PANEL
ALT: 19 U/L (ref 0–53)
AST: 22 U/L (ref 0–37)
Albumin: 4.1 g/dL (ref 3.5–5.2)
Alkaline Phosphatase: 33 U/L — ABNORMAL LOW (ref 39–117)
Bilirubin, Direct: 0.1 mg/dL (ref 0.0–0.3)
Total Bilirubin: 0.6 mg/dL (ref 0.3–1.2)
Total Protein: 7 g/dL (ref 6.0–8.3)

## 2012-02-14 LAB — LIPID PANEL
Cholesterol: 122 mg/dL (ref 0–200)
HDL: 42.2 mg/dL (ref >39.00)
LDL Cholesterol: 62 mg/dL (ref 0–99)
Total CHOL/HDL Ratio: 3
Triglycerides: 87 mg/dL (ref 0.0–149.0)
VLDL: 17.4 mg/dL (ref 0.0–40.0)

## 2012-02-14 NOTE — Assessment & Plan Note (Signed)
As above.

## 2012-02-14 NOTE — Assessment & Plan Note (Signed)
conitue current meds  Will check lipids in am in anticipation of visit with TDS

## 2012-02-14 NOTE — Assessment & Plan Note (Signed)
No revcurences

## 2012-02-17 ENCOUNTER — Encounter (HOSPITAL_COMMUNITY): Payer: PRIVATE HEALTH INSURANCE

## 2012-02-19 ENCOUNTER — Ambulatory Visit: Payer: PRIVATE HEALTH INSURANCE | Admitting: Cardiology

## 2012-02-19 ENCOUNTER — Encounter: Payer: Self-pay | Admitting: *Deleted

## 2012-02-19 ENCOUNTER — Encounter (HOSPITAL_COMMUNITY): Payer: PRIVATE HEALTH INSURANCE

## 2012-02-21 ENCOUNTER — Encounter (HOSPITAL_COMMUNITY): Payer: PRIVATE HEALTH INSURANCE

## 2012-02-24 ENCOUNTER — Encounter (HOSPITAL_COMMUNITY)
Admission: RE | Admit: 2012-02-24 | Discharge: 2012-02-24 | Disposition: A | Discharge status: Home / Self Care | Payer: PRIVATE HEALTH INSURANCE | Source: Ambulatory Visit | Attending: Cardiology | Admitting: Cardiology

## 2012-02-26 ENCOUNTER — Encounter (HOSPITAL_COMMUNITY)
Admission: RE | Admit: 2012-02-26 | Discharge: 2012-02-26 | Disposition: A | Discharge status: Home / Self Care | Payer: PRIVATE HEALTH INSURANCE | Source: Ambulatory Visit | Attending: Cardiology | Admitting: Cardiology

## 2012-02-26 NOTE — Progress Notes (Signed)
Reviewed quality of life with pt.  Pt expresses depression over job loss, however stays busy. This job loss has been difficult for pt as he was very passionate about his role.   Pt has discussed with PCP.  Pt has positive coping skills.

## 2012-02-28 ENCOUNTER — Encounter (HOSPITAL_COMMUNITY)
Admission: RE | Admit: 2012-02-28 | Discharge: 2012-02-28 | Disposition: A | Discharge status: Home / Self Care | Payer: PRIVATE HEALTH INSURANCE | Source: Ambulatory Visit | Attending: Cardiology | Admitting: Cardiology

## 2012-03-02 ENCOUNTER — Encounter (HOSPITAL_COMMUNITY): Payer: PRIVATE HEALTH INSURANCE

## 2012-03-02 DIAGNOSIS — G4733 Obstructive sleep apnea (adult) (pediatric): Secondary | ICD-10-CM | POA: Insufficient documentation

## 2012-03-02 DIAGNOSIS — I10 Essential (primary) hypertension: Secondary | ICD-10-CM | POA: Insufficient documentation

## 2012-03-02 DIAGNOSIS — Z5189 Encounter for other specified aftercare: Secondary | ICD-10-CM | POA: Insufficient documentation

## 2012-03-02 DIAGNOSIS — F411 Generalized anxiety disorder: Secondary | ICD-10-CM | POA: Insufficient documentation

## 2012-03-02 DIAGNOSIS — I2510 Atherosclerotic heart disease of native coronary artery without angina pectoris: Secondary | ICD-10-CM | POA: Insufficient documentation

## 2012-03-02 DIAGNOSIS — K219 Gastro-esophageal reflux disease without esophagitis: Secondary | ICD-10-CM | POA: Insufficient documentation

## 2012-03-02 DIAGNOSIS — E785 Hyperlipidemia, unspecified: Secondary | ICD-10-CM | POA: Insufficient documentation

## 2012-03-02 DIAGNOSIS — I498 Other specified cardiac arrhythmias: Secondary | ICD-10-CM | POA: Insufficient documentation

## 2012-03-02 DIAGNOSIS — Z9861 Coronary angioplasty status: Secondary | ICD-10-CM | POA: Insufficient documentation

## 2012-03-02 DIAGNOSIS — Z8249 Family history of ischemic heart disease and other diseases of the circulatory system: Secondary | ICD-10-CM | POA: Insufficient documentation

## 2012-03-02 DIAGNOSIS — Z7982 Long term (current) use of aspirin: Secondary | ICD-10-CM | POA: Insufficient documentation

## 2012-03-04 ENCOUNTER — Encounter (HOSPITAL_COMMUNITY)
Admission: RE | Admit: 2012-03-04 | Discharge: 2012-03-04 | Disposition: A | Discharge status: Home / Self Care | Payer: PRIVATE HEALTH INSURANCE | Source: Ambulatory Visit | Attending: Cardiology | Admitting: Cardiology

## 2012-03-05 ENCOUNTER — Ambulatory Visit (INDEPENDENT_AMBULATORY_CARE_PROVIDER_SITE_OTHER): Payer: PRIVATE HEALTH INSURANCE | Admitting: Cardiology

## 2012-03-05 ENCOUNTER — Encounter: Payer: Self-pay | Admitting: Cardiology

## 2012-03-05 VITALS — BP 121/74 | HR 57 | Ht 72.0 in | Wt 220.0 lb

## 2012-03-05 DIAGNOSIS — R55 Syncope and collapse: Secondary | ICD-10-CM

## 2012-03-05 DIAGNOSIS — I2510 Atherosclerotic heart disease of native coronary artery without angina pectoris: Secondary | ICD-10-CM

## 2012-03-05 DIAGNOSIS — E785 Hyperlipidemia, unspecified: Secondary | ICD-10-CM

## 2012-03-05 NOTE — Patient Instructions (Signed)
Your physician recommends that you schedule a follow-up appointment in: 3 MONTHS  Your physician recommends that you continue on your current medications as directed. Please refer to the Current Medication list given to you today.   

## 2012-03-06 ENCOUNTER — Encounter (HOSPITAL_COMMUNITY)
Admission: RE | Admit: 2012-03-06 | Discharge: 2012-03-06 | Disposition: A | Discharge status: Home / Self Care | Payer: PRIVATE HEALTH INSURANCE | Source: Ambulatory Visit | Attending: Cardiology | Admitting: Cardiology

## 2012-03-07 NOTE — Progress Notes (Signed)
   HPI:  The patient  returns today in followup. From a cardiac standpoint, he seems to be getting along really quite well. He is in the process of trying to apply for new jobs, and he has  found this a little bit stressful as he has gotten into the full swing of doing this. Otherwise, I am quite pleased with his progress. He's had no syncope or presyncope associated with any of this, and his overall exertional capacity seems to be really quite good.  Current Outpatient Prescriptions  Medication Sig Dispense Refill  . ALPRAZolam (XANAX) 1 MG tablet Take one half tablet twice daily  30 tablet  5  . aspirin 81 MG tablet Take 81 mg by mouth daily.      . clopidogrel (PLAVIX) 75 MG tablet Take 1 tablet (75 mg total) by mouth daily with breakfast.  30 tablet  4  . metoprolol tartrate (LOPRESSOR) 25 MG tablet Take 0.5 tablets (12.5 mg total) by mouth 2 (two) times daily.  60 tablet  4  . nitroGLYCERIN (NITROSTAT) 0.4 MG SL tablet Place 1 tablet (0.4 mg total) under the tongue every 5 (five) minutes as needed for chest pain.  25 tablet  3  . pravastatin (PRAVACHOL) 40 MG tablet Take 40 mg by mouth daily.      . famotidine (PEPCID) 20 MG tablet Take 20 mg by mouth daily.        No Known Allergies  Past Medical History  Diagnosis Date  . Diverticulosis     colon  . Hypertension   . Hyperlipidemia   . OSA (obstructive sleep apnea)   . Depression   . Trigeminal neuralgia 4/11    Microvascular decompression    Past Surgical History  Procedure Date  . Craniotomy 10/25/2009    for trigeminal neuralgia  . Spine surgery     lumbar x 2  . Achilles tendon repair     x 3  . Nasal sinus surgery   . Back surgery     Family History  Problem Relation Age of Onset  . Hypertension Mother   . Heart disease Mother 72  . Heart disease Brother 18    History   Social History  . Marital Status: Married    Spouse Name: Arline Asp    Number of Children: 3  . Years of Education: N/A   Occupational  History  . 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 Smoking status: Never Smoker   . Smokeless tobacco: Never Used  . Alcohol Use: No  . Drug Use: Not on file  . Sexually Active: Not on file   Other Topics Concern  . Not on file   Social History Narrative   Lives in [place], [place] with wife.     ROS: Please see the HPI.  All other systems reviewed and negative.  PHYSICAL EXAM:  BP 121/74  Pulse 57  Ht 6' (1.829 m)  Wt 220 lb (99.791 kg)  BMI 29.84 kg/m2  General: Well developed, well nourished, in no acute distress. Head:  Normocephalic and atraumatic. Neck: no JVD Lungs: Clear to auscultation and percussion. Heart: Normal S1 and S2.  No murmur, rubs or gallops.  Pulses: Pulses normal in all 4 extremities. Extremities: No clubbing or cyanosis. No edema. Neurologic: Alert and oriented x 3.  EKG:  SB.  Otherwise normal.    ASSESSMENT AND PLAN:

## 2012-03-09 ENCOUNTER — Encounter (HOSPITAL_COMMUNITY)
Admission: RE | Admit: 2012-03-09 | Discharge: 2012-03-09 | Disposition: A | Discharge status: Home / Self Care | Payer: PRIVATE HEALTH INSURANCE | Source: Ambulatory Visit | Attending: Cardiology | Admitting: Cardiology

## 2012-03-11 ENCOUNTER — Encounter (HOSPITAL_COMMUNITY)
Admission: RE | Admit: 2012-03-11 | Discharge: 2012-03-11 | Disposition: A | Discharge status: Home / Self Care | Payer: PRIVATE HEALTH INSURANCE | Source: Ambulatory Visit | Attending: Cardiology | Admitting: Cardiology

## 2012-03-13 ENCOUNTER — Encounter (HOSPITAL_COMMUNITY)
Admission: RE | Admit: 2012-03-13 | Discharge: 2012-03-13 | Disposition: A | Discharge status: Home / Self Care | Payer: PRIVATE HEALTH INSURANCE | Source: Ambulatory Visit | Attending: Cardiology | Admitting: Cardiology

## 2012-03-16 ENCOUNTER — Encounter (HOSPITAL_COMMUNITY)
Admission: RE | Admit: 2012-03-16 | Discharge: 2012-03-16 | Disposition: A | Discharge status: Home / Self Care | Payer: PRIVATE HEALTH INSURANCE | Source: Ambulatory Visit | Attending: Cardiology | Admitting: Cardiology

## 2012-03-18 ENCOUNTER — Encounter (HOSPITAL_COMMUNITY)
Admission: RE | Admit: 2012-03-18 | Discharge: 2012-03-18 | Disposition: A | Discharge status: Home / Self Care | Payer: PRIVATE HEALTH INSURANCE | Source: Ambulatory Visit | Attending: Cardiology | Admitting: Cardiology

## 2012-03-20 ENCOUNTER — Encounter (HOSPITAL_COMMUNITY)
Admission: RE | Admit: 2012-03-20 | Discharge: 2012-03-20 | Disposition: A | Discharge status: Home / Self Care | Payer: PRIVATE HEALTH INSURANCE | Source: Ambulatory Visit | Attending: Cardiology | Admitting: Cardiology

## 2012-03-23 ENCOUNTER — Encounter (HOSPITAL_COMMUNITY): Payer: PRIVATE HEALTH INSURANCE

## 2012-03-23 ENCOUNTER — Encounter (HOSPITAL_COMMUNITY)
Admission: RE | Admit: 2012-03-23 | Discharge: 2012-03-23 | Disposition: A | Discharge status: Home / Self Care | Payer: PRIVATE HEALTH INSURANCE | Source: Ambulatory Visit | Attending: Cardiology | Admitting: Cardiology

## 2012-03-24 NOTE — Assessment & Plan Note (Signed)
He is stable at the present time.  No new symptoms.  Of note, patient had stress nuclear before intervention, with no ECG changes at ten minutes, and inferior ischemia.  The LAD territory was not ischemic.  As such, no current need for repeat testing.  As such, continue medical therapy with aggressive risk factor reduction.

## 2012-03-24 NOTE — Assessment & Plan Note (Signed)
He has been under the care of Dr. Graciela Husbands, with EP recs per him.  No change at present.  Continue for now.

## 2012-03-24 NOTE — Assessment & Plan Note (Signed)
The patient is at [facility].  We will continue medical therapy for now.  No new recommendations.

## 2012-03-25 ENCOUNTER — Encounter (HOSPITAL_COMMUNITY)
Admission: RE | Admit: 2012-03-25 | Discharge: 2012-03-25 | Disposition: A | Discharge status: Home / Self Care | Payer: PRIVATE HEALTH INSURANCE | Source: Ambulatory Visit | Attending: Cardiology | Admitting: Cardiology

## 2012-03-25 ENCOUNTER — Encounter (HOSPITAL_COMMUNITY): Payer: PRIVATE HEALTH INSURANCE

## 2012-03-27 ENCOUNTER — Encounter (HOSPITAL_COMMUNITY): Payer: PRIVATE HEALTH INSURANCE

## 2012-03-27 ENCOUNTER — Encounter (HOSPITAL_COMMUNITY)
Admission: RE | Admit: 2012-03-27 | Discharge: 2012-03-27 | Disposition: A | Discharge status: Home / Self Care | Payer: PRIVATE HEALTH INSURANCE | Source: Ambulatory Visit | Attending: Cardiology | Admitting: Cardiology

## 2012-03-30 ENCOUNTER — Encounter (HOSPITAL_COMMUNITY): Payer: PRIVATE HEALTH INSURANCE

## 2012-03-30 ENCOUNTER — Encounter (HOSPITAL_COMMUNITY)
Admission: RE | Admit: 2012-03-30 | Discharge: 2012-03-30 | Disposition: A | Discharge status: Home / Self Care | Payer: PRIVATE HEALTH INSURANCE | Source: Ambulatory Visit | Attending: Cardiology | Admitting: Cardiology

## 2012-04-01 ENCOUNTER — Encounter (HOSPITAL_COMMUNITY): Payer: PRIVATE HEALTH INSURANCE

## 2012-04-01 ENCOUNTER — Encounter (HOSPITAL_COMMUNITY)
Admission: RE | Admit: 2012-04-01 | Discharge: 2012-04-01 | Disposition: A | Discharge status: Home / Self Care | Payer: PRIVATE HEALTH INSURANCE | Source: Ambulatory Visit | Attending: Cardiology | Admitting: Cardiology

## 2012-04-01 DIAGNOSIS — Z9861 Coronary angioplasty status: Secondary | ICD-10-CM | POA: Insufficient documentation

## 2012-04-01 DIAGNOSIS — K219 Gastro-esophageal reflux disease without esophagitis: Secondary | ICD-10-CM | POA: Insufficient documentation

## 2012-04-01 DIAGNOSIS — I2510 Atherosclerotic heart disease of native coronary artery without angina pectoris: Secondary | ICD-10-CM | POA: Insufficient documentation

## 2012-04-01 DIAGNOSIS — G4733 Obstructive sleep apnea (adult) (pediatric): Secondary | ICD-10-CM | POA: Insufficient documentation

## 2012-04-01 DIAGNOSIS — Z8249 Family history of ischemic heart disease and other diseases of the circulatory system: Secondary | ICD-10-CM | POA: Insufficient documentation

## 2012-04-01 DIAGNOSIS — F411 Generalized anxiety disorder: Secondary | ICD-10-CM | POA: Insufficient documentation

## 2012-04-01 DIAGNOSIS — Z7982 Long term (current) use of aspirin: Secondary | ICD-10-CM | POA: Insufficient documentation

## 2012-04-01 DIAGNOSIS — E785 Hyperlipidemia, unspecified: Secondary | ICD-10-CM | POA: Insufficient documentation

## 2012-04-01 DIAGNOSIS — Z5189 Encounter for other specified aftercare: Secondary | ICD-10-CM | POA: Insufficient documentation

## 2012-04-01 DIAGNOSIS — I498 Other specified cardiac arrhythmias: Secondary | ICD-10-CM | POA: Insufficient documentation

## 2012-04-01 DIAGNOSIS — I10 Essential (primary) hypertension: Secondary | ICD-10-CM | POA: Insufficient documentation

## 2012-04-03 ENCOUNTER — Encounter (HOSPITAL_COMMUNITY)
Admission: RE | Admit: 2012-04-03 | Discharge: 2012-04-03 | Disposition: A | Discharge status: Home / Self Care | Payer: PRIVATE HEALTH INSURANCE | Source: Ambulatory Visit | Attending: Cardiology | Admitting: Cardiology

## 2012-04-03 ENCOUNTER — Encounter (HOSPITAL_COMMUNITY): Payer: PRIVATE HEALTH INSURANCE

## 2012-04-06 ENCOUNTER — Encounter (HOSPITAL_COMMUNITY)
Admission: RE | Admit: 2012-04-06 | Discharge: 2012-04-06 | Disposition: A | Discharge status: Home / Self Care | Payer: PRIVATE HEALTH INSURANCE | Source: Ambulatory Visit | Attending: Cardiology | Admitting: Cardiology

## 2012-04-06 ENCOUNTER — Encounter (HOSPITAL_COMMUNITY): Payer: PRIVATE HEALTH INSURANCE

## 2012-04-08 ENCOUNTER — Encounter (HOSPITAL_COMMUNITY)
Admission: RE | Admit: 2012-04-08 | Discharge: 2012-04-08 | Disposition: A | Discharge status: Home / Self Care | Payer: PRIVATE HEALTH INSURANCE | Source: Ambulatory Visit | Attending: Cardiology | Admitting: Cardiology

## 2012-04-08 ENCOUNTER — Encounter (HOSPITAL_COMMUNITY): Payer: PRIVATE HEALTH INSURANCE

## 2012-04-10 ENCOUNTER — Encounter (HOSPITAL_COMMUNITY): Payer: PRIVATE HEALTH INSURANCE

## 2012-04-10 ENCOUNTER — Encounter (HOSPITAL_COMMUNITY)
Admission: RE | Admit: 2012-04-10 | Discharge: 2012-04-10 | Disposition: A | Discharge status: Home / Self Care | Payer: PRIVATE HEALTH INSURANCE | Source: Ambulatory Visit | Attending: Cardiology | Admitting: Cardiology

## 2012-04-13 ENCOUNTER — Encounter (HOSPITAL_COMMUNITY): Payer: PRIVATE HEALTH INSURANCE

## 2012-04-13 ENCOUNTER — Encounter (HOSPITAL_COMMUNITY)
Admission: RE | Admit: 2012-04-13 | Discharge: 2012-04-13 | Disposition: A | Discharge status: Home / Self Care | Payer: PRIVATE HEALTH INSURANCE | Source: Ambulatory Visit | Attending: Cardiology | Admitting: Cardiology

## 2012-04-15 ENCOUNTER — Encounter (HOSPITAL_COMMUNITY): Payer: PRIVATE HEALTH INSURANCE

## 2012-04-15 ENCOUNTER — Encounter (HOSPITAL_COMMUNITY)
Admission: RE | Admit: 2012-04-15 | Discharge: 2012-04-15 | Disposition: A | Discharge status: Home / Self Care | Payer: PRIVATE HEALTH INSURANCE | Source: Ambulatory Visit | Attending: Cardiology | Admitting: Cardiology

## 2012-04-17 ENCOUNTER — Encounter (HOSPITAL_COMMUNITY)
Admission: RE | Admit: 2012-04-17 | Discharge: 2012-04-17 | Disposition: A | Discharge status: Home / Self Care | Payer: PRIVATE HEALTH INSURANCE | Source: Ambulatory Visit | Attending: Cardiology | Admitting: Cardiology

## 2012-04-17 ENCOUNTER — Encounter (HOSPITAL_COMMUNITY): Payer: PRIVATE HEALTH INSURANCE

## 2012-04-20 ENCOUNTER — Encounter (HOSPITAL_COMMUNITY): Payer: PRIVATE HEALTH INSURANCE

## 2012-04-20 ENCOUNTER — Encounter (HOSPITAL_COMMUNITY)
Admission: RE | Admit: 2012-04-20 | Discharge: 2012-04-20 | Disposition: A | Discharge status: Home / Self Care | Payer: PRIVATE HEALTH INSURANCE | Source: Ambulatory Visit | Attending: Cardiology | Admitting: Cardiology

## 2012-04-22 ENCOUNTER — Encounter (HOSPITAL_COMMUNITY)
Admission: RE | Admit: 2012-04-22 | Discharge: 2012-04-22 | Disposition: A | Discharge status: Home / Self Care | Payer: PRIVATE HEALTH INSURANCE | Source: Ambulatory Visit | Attending: Cardiology | Admitting: Cardiology

## 2012-04-22 ENCOUNTER — Encounter (HOSPITAL_COMMUNITY): Payer: PRIVATE HEALTH INSURANCE

## 2012-04-24 ENCOUNTER — Encounter (HOSPITAL_COMMUNITY)
Admission: RE | Admit: 2012-04-24 | Discharge: 2012-04-24 | Disposition: A | Discharge status: Home / Self Care | Payer: PRIVATE HEALTH INSURANCE | Source: Ambulatory Visit | Attending: Cardiology | Admitting: Cardiology

## 2012-04-24 ENCOUNTER — Encounter (HOSPITAL_COMMUNITY): Payer: PRIVATE HEALTH INSURANCE

## 2012-04-27 ENCOUNTER — Ambulatory Visit (HOSPITAL_COMMUNITY): Payer: PRIVATE HEALTH INSURANCE

## 2012-04-27 ENCOUNTER — Encounter (HOSPITAL_COMMUNITY)
Admission: RE | Admit: 2012-04-27 | Discharge: 2012-04-27 | Disposition: A | Discharge status: Home / Self Care | Payer: PRIVATE HEALTH INSURANCE | Source: Ambulatory Visit | Attending: Cardiology | Admitting: Cardiology

## 2012-04-27 NOTE — Progress Notes (Signed)
Continue to monitor pt resting and cool down HR.  Previously strips sent to Dr. Riley Kill with sinus brady 40's.  Pt asymptomatic with no complaints.  Pt did not have any other repeat episodes of sinsu brady.  Dr. Riley Kill notified.  No new orders received.  Pt graduates today from phase II cardiac rehab.  Pt plans to continue his home exercise with walking and the bike.  Medication list reconciled.

## 2012-04-29 ENCOUNTER — Ambulatory Visit (HOSPITAL_COMMUNITY): Payer: PRIVATE HEALTH INSURANCE

## 2012-04-29 ENCOUNTER — Encounter (HOSPITAL_COMMUNITY): Payer: PRIVATE HEALTH INSURANCE

## 2012-05-01 ENCOUNTER — Encounter (HOSPITAL_COMMUNITY): Payer: PRIVATE HEALTH INSURANCE

## 2012-05-01 ENCOUNTER — Ambulatory Visit (HOSPITAL_COMMUNITY): Payer: PRIVATE HEALTH INSURANCE

## 2012-05-04 ENCOUNTER — Ambulatory Visit (HOSPITAL_COMMUNITY): Payer: PRIVATE HEALTH INSURANCE

## 2012-05-04 ENCOUNTER — Encounter (HOSPITAL_COMMUNITY): Payer: PRIVATE HEALTH INSURANCE

## 2012-05-06 ENCOUNTER — Encounter (HOSPITAL_COMMUNITY): Payer: PRIVATE HEALTH INSURANCE

## 2012-05-06 ENCOUNTER — Ambulatory Visit (HOSPITAL_COMMUNITY): Payer: PRIVATE HEALTH INSURANCE

## 2012-05-08 ENCOUNTER — Ambulatory Visit (HOSPITAL_COMMUNITY): Payer: PRIVATE HEALTH INSURANCE

## 2012-05-08 ENCOUNTER — Encounter (HOSPITAL_COMMUNITY): Payer: PRIVATE HEALTH INSURANCE

## 2012-05-11 ENCOUNTER — Encounter (HOSPITAL_COMMUNITY): Payer: PRIVATE HEALTH INSURANCE

## 2012-05-11 ENCOUNTER — Ambulatory Visit (HOSPITAL_COMMUNITY): Payer: PRIVATE HEALTH INSURANCE

## 2012-05-12 ENCOUNTER — Other Ambulatory Visit (HOSPITAL_COMMUNITY): Payer: Self-pay | Admitting: Cardiology

## 2012-05-13 ENCOUNTER — Ambulatory Visit (HOSPITAL_COMMUNITY): Payer: PRIVATE HEALTH INSURANCE

## 2012-05-13 ENCOUNTER — Encounter (HOSPITAL_COMMUNITY): Payer: PRIVATE HEALTH INSURANCE

## 2012-05-15 ENCOUNTER — Ambulatory Visit (HOSPITAL_COMMUNITY): Payer: PRIVATE HEALTH INSURANCE

## 2012-05-15 ENCOUNTER — Encounter (HOSPITAL_COMMUNITY): Payer: PRIVATE HEALTH INSURANCE

## 2012-05-18 ENCOUNTER — Ambulatory Visit (HOSPITAL_COMMUNITY): Payer: PRIVATE HEALTH INSURANCE

## 2012-05-18 ENCOUNTER — Encounter (HOSPITAL_COMMUNITY): Payer: PRIVATE HEALTH INSURANCE

## 2012-05-20 ENCOUNTER — Ambulatory Visit (HOSPITAL_COMMUNITY): Payer: PRIVATE HEALTH INSURANCE

## 2012-05-20 ENCOUNTER — Encounter (HOSPITAL_COMMUNITY): Payer: PRIVATE HEALTH INSURANCE

## 2012-05-22 ENCOUNTER — Ambulatory Visit (HOSPITAL_COMMUNITY): Payer: PRIVATE HEALTH INSURANCE

## 2012-05-22 ENCOUNTER — Encounter (HOSPITAL_COMMUNITY): Payer: PRIVATE HEALTH INSURANCE

## 2012-05-27 ENCOUNTER — Ambulatory Visit (INDEPENDENT_AMBULATORY_CARE_PROVIDER_SITE_OTHER): Payer: PRIVATE HEALTH INSURANCE | Admitting: Family Medicine

## 2012-05-27 ENCOUNTER — Encounter: Payer: Self-pay | Admitting: Family Medicine

## 2012-05-27 VITALS — BP 138/76 | HR 76 | Resp 18 | Ht 70.0 in | Wt 176.0 lb

## 2012-05-27 DIAGNOSIS — I2510 Atherosclerotic heart disease of native coronary artery without angina pectoris: Secondary | ICD-10-CM

## 2012-05-27 DIAGNOSIS — Z0000 Encounter for general adult medical examination without abnormal findings: Secondary | ICD-10-CM

## 2012-05-27 NOTE — Progress Notes (Signed)
@[email or web address] @  Patient ID: John Berry MRN: [id number], DOB: [birth year] 61 y.o. Date of Encounter: 05/27/2012, 12:38 PM  Primary Physician: Lucilla Edin, MD  Chief Complaint: Physical (CPE)  HPI: 61 y.o. y/o male with history noted below here for CPE.  Doing well. No issues/complaints.  Review of Systems: Consitutional: No fever, chills, fatigue, night sweats, lymphadenopathy, or weight changes. Eyes: No visual changes, eye redness, or discharge. ENT/Mouth: Ears: No otalgia, tinnitus, hearing loss, discharge. Nose: No congestion, rhinorrhea, sinus pain, or epistaxis. Throat: No sore throat, post nasal drip, or teeth pain. Cardiovascular: No CP, palpitations, diaphoresis, DOE, edema, orthopnea, PND. Respiratory: No cough, hemoptysis, SOB, or wheezing. Gastrointestinal: No anorexia, dysphagia, reflux, pain, nausea, vomiting, hematemesis, diarrhea, constipation, BRBPR, or melena. Genitourinary: No dysuria, frequency, urgency, hematuria, incontinence, nocturia, decreased urinary stream, discharge, impotence, or testicular pain/masses. Musculoskeletal: No decreased ROM, myalgias, stiffness, joint swelling, or weakness. Skin: No rash, erythema, lesion changes, pain, warmth, jaundice, or pruritis. Neurological: No headache, dizziness, syncope, seizures, tremors, memory loss, coordination problems, or paresthesias. Psychological: No anxiety, depression, hallucinations, SI/HI. Endocrine: No fatigue, polydipsia, polyphagia, polyuria, or known diabetes. All other systems were reviewed and are otherwise negative.  Past Medical History  Diagnosis Date  . Diverticulosis     colon  . Hypertension   . Hyperlipidemia   . OSA (obstructive sleep apnea)   . Depression   . Trigeminal neuralgia 4/11    Microvascular decompression     Past Surgical History  Procedure Date  . Craniotomy 10/25/2009    for trigeminal neuralgia  . Spine surgery     lumbar x 2  . Achilles tendon repair     x 3   . Nasal sinus surgery   . Back surgery     Home Meds:  Prior to Admission medications   Medication Sig Start Date End Date Taking? Authorizing Provider  ALPRAZolam Prudy Feeler) 1 MG tablet Take one half tablet twice daily 02/11/12   Collene Gobble, MD  aspirin 81 MG tablet Take 81 mg by mouth daily.    Historical Provider, MD  clopidogrel (PLAVIX) 75 MG tablet TAKE ONE TABLET BY MOUTH EVERY DAY WITH BREAKFAST 05/12/12   Herby Abraham, MD  famotidine (PEPCID) 20 MG tablet Take 20 mg by mouth daily.    Historical Provider, MD  metoprolol tartrate (LOPRESSOR) 25 MG tablet Take 0.5 tablets (12.5 mg total) by mouth 2 (two) times daily. 12/17/11   Brooke O Edmisten, PA-C  nitroGLYCERIN (NITROSTAT) 0.4 MG SL tablet Place 1 tablet (0.4 mg total) under the tongue every 5 (five) minutes as needed for chest pain. 12/19/11 12/18/12  Herby Abraham, MD  pravastatin (PRAVACHOL) 40 MG tablet Take 40 mg by mouth daily.    Historical Provider, MD    Allergies: No Known Allergies  History   Social History  . Marital Status: Married    Spouse Name: Arline Asp    Number of Children: 3  . Years of Education: N/A   Occupational History  . 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 Smoking status: Never Smoker   . Smokeless tobacco: Never Used  . Alcohol Use: No  . Drug Use: Not on file  . Sexually Active: Not on file   Other Topics Concern  . Not on file   Social History Narrative   Lives in [place], [place] with wife.     Family History  Problem Relation Age of Onset  . Hypertension Mother   . Heart  disease Mother 60  . Heart disease Brother 49    Physical Exam: There were no vitals taken for this visit.  General: Well developed, well nourished, in no acute distress. HEENT: Normocephalic, atraumatic. Conjunctiva pink, sclera non-icteric. Pupils 2 mm constricting to 1 mm, round, regular, and equally reactive to light and accomodation. EOMI. Internal auditory canal clear. TMs with good cone of  light and without pathology. Nasal mucosa pink. Nares are without discharge. No sinus tenderness. Oral mucosa pink. Dentition good. Pharynx without exudate.   Neck: Supple. Trachea midline. No thyromegaly. Full ROM. No lymphadenopathy. Lungs: Clear to auscultation bilaterally without wheezes, rales, or rhonchi. Breathing is of normal effort and unlabored. Cardiovascular: RRR with S1 S2. No murmurs, rubs, or gallops appreciated. Distal pulses 2+ symmetrically. No carotid or abdominal bruits Abdomen: Soft, non-tender, non-distended with normoactive bowel sounds. No hepatosplenomegaly or masses. No rebound/guarding. No CVA tenderness. Without hernias.   Genitourinary:  circumcised male. No penile lesions. Testes descended bilaterally, and smooth without tenderness or masses.  Musculoskeletal: Full range of motion and 5/5 strength throughout. Without swelling, atrophy, tenderness, crepitus, or warmth. Extremities without clubbing, cyanosis, or edema. Calves supple. Skin: Warm and moist without erythema, ecchymosis, wounds, or rash. Neuro: A+Ox3. CN II-XII grossly intact. Moves all extremities spontaneously. Full sensation throughout. Normal gait. DTR 2+ throughout upper and lower extremities. Finger to nose intact. Psych:  Responds to questions appropriately with a normal affect.    Assessment/Plan:  61 y.o. y/o  male here for CPE -DOT completed  Signed, Elvina Sidle, MD 05/27/2012 12:38 PM

## 2012-06-08 ENCOUNTER — Ambulatory Visit: Payer: PRIVATE HEALTH INSURANCE | Admitting: Cardiology

## 2012-06-12 ENCOUNTER — Encounter: Payer: Self-pay | Admitting: Cardiology

## 2012-06-12 ENCOUNTER — Ambulatory Visit (INDEPENDENT_AMBULATORY_CARE_PROVIDER_SITE_OTHER): Payer: PRIVATE HEALTH INSURANCE | Admitting: Cardiology

## 2012-06-12 VITALS — BP 108/72 | HR 52

## 2012-06-12 DIAGNOSIS — E785 Hyperlipidemia, unspecified: Secondary | ICD-10-CM

## 2012-06-12 DIAGNOSIS — I2510 Atherosclerotic heart disease of native coronary artery without angina pectoris: Secondary | ICD-10-CM

## 2012-06-12 NOTE — Assessment & Plan Note (Signed)
The patient continues to do well from a cardiac standpoint. He's lost nearly 30 pounds, and is continuing to remain active. He denies any ongoing chest pain. I will continue to treat him medically at the present time. Dual antiplatelet therapy will complete be completed for one year. When he returns in March, we will have him do an exercise treadmill study as he was completed the rehabilitation program. Importantly, he will need exercise prescription, specifically given the fact that he has residual disease.

## 2012-06-12 NOTE — Progress Notes (Signed)
   HPI:  John Berry returns today in followup. He is doing extremely well. He's lost about 30 pounds. He denies any ongoing chest pain. He's had occasional right chest discomfort, but it is almost certainly muscular. There is no diaphoresis shortness of breath or other major associated cardiac symptoms.  Current Outpatient Prescriptions  Medication Sig Dispense Refill  . ALPRAZolam (XANAX) 1 MG tablet Take one half tablet twice daily  30 tablet  5  . aspirin 81 MG tablet Take 81 mg by mouth daily.      . clopidogrel (PLAVIX) 75 MG tablet TAKE ONE TABLET BY MOUTH EVERY DAY WITH BREAKFAST  30 tablet  3  . famotidine (PEPCID) 20 MG tablet Take 20 mg by mouth daily.      . metoprolol tartrate (LOPRESSOR) 25 MG tablet Take 0.5 tablets (12.5 mg total) by mouth 2 (two) times daily.  60 tablet  4  . nitroGLYCERIN (NITROSTAT) 0.4 MG SL tablet Place 1 tablet (0.4 mg total) under the tongue every 5 (five) minutes as needed for chest pain.  25 tablet  3  . pravastatin (PRAVACHOL) 40 MG tablet Take 40 mg by mouth daily.      . citalopram (CELEXA) 10 MG tablet         No Known Allergies  Past Medical History  Diagnosis Date  . Diverticulosis     colon  . Hypertension   . Hyperlipidemia   . OSA (obstructive sleep apnea)   . Depression   . Trigeminal neuralgia 4/11    Microvascular decompression    Past Surgical History  Procedure Date  . Craniotomy 10/25/2009    for trigeminal neuralgia  . Spine surgery     lumbar x 2  . Achilles tendon repair     x 3  . Nasal sinus surgery   . Back surgery     Family History  Problem Relation Age of Onset  . Hypertension Mother   . Heart disease Mother 9  . Heart disease Brother 28    History   Social History  . Marital Status: Married    Spouse Name: Arline Asp    Number of Children: 3  . Years of Education: N/A   Occupational History  . 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 Smoking status: Never Smoker   . Smokeless tobacco: Never  Used  . Alcohol Use: No  . Drug Use: Not on file  . Sexually Active: Not on file   Other Topics Concern  . Not on file   Social History Narrative   Lives in [place], [place] with wife.     ROS: Please see the HPI.  All other systems reviewed and negative.  PHYSICAL EXAM:  BP 108/72  Pulse 52  SpO2 99%  General: Well developed, well nourished, in no acute distress. Head:  Normocephalic and atraumatic. Neck: no JVD Lungs: Clear to auscultation and percussion. Heart: Normal S1 and S2.  No murmur, rubs or gallops.  Abdomen:  Normal bowel sounds; soft; non tender; no organomegaly Pulses: Pulses normal in all 4 extremities. Extremities: No clubbing or cyanosis. No edema. Neurologic: Alert and oriented x 3.  EKG:  SB.  Otherwise WNL.    ASSESSMENT AND PLAN:

## 2012-06-12 NOTE — Patient Instructions (Addendum)
Return in 3 months for follow up with Dr. Riley Kill Continue current medications. Refer to medication list provided to you today.

## 2012-06-21 NOTE — Assessment & Plan Note (Signed)
At [facility] when last checked.  TS

## 2012-07-26 ENCOUNTER — Ambulatory Visit: Payer: PRIVATE HEALTH INSURANCE

## 2012-07-26 ENCOUNTER — Ambulatory Visit (INDEPENDENT_AMBULATORY_CARE_PROVIDER_SITE_OTHER): Payer: PRIVATE HEALTH INSURANCE | Admitting: Internal Medicine

## 2012-07-26 VITALS — BP 119/74 | HR 67 | Temp 97.7°F | Resp 16 | Ht 71.5 in | Wt 200.0 lb

## 2012-07-26 DIAGNOSIS — S61311A Laceration without foreign body of left index finger with damage to nail, initial encounter: Secondary | ICD-10-CM

## 2012-07-26 DIAGNOSIS — S6990XA Unspecified injury of unspecified wrist, hand and finger(s), initial encounter: Secondary | ICD-10-CM

## 2012-07-26 DIAGNOSIS — S6980XA Other specified injuries of unspecified wrist, hand and finger(s), initial encounter: Secondary | ICD-10-CM

## 2012-07-26 DIAGNOSIS — S61209A Unspecified open wound of unspecified finger without damage to nail, initial encounter: Secondary | ICD-10-CM
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
## 2012-07-26 NOTE — Progress Notes (Signed)
Procedure:  Consent obtained.  MC block with 1% lido and marcaine for anesthesia.  Skin flap closed with 5-0 Ethilon #7 SI and 5-0 Vicryl #2 SI through nail bed. Pt tolerated well.  Drsg placed.  Wound care d/w pt.

## 2012-07-26 NOTE — Patient Instructions (Signed)
Wound Care  Wound care helps prevent pain and infection.    You may need a tetanus shot if:   You cannot remember when you had your last tetanus shot.   You have never had a tetanus shot.   The injury broke your skin.  If you need a tetanus shot and you choose not to have one, you may get tetanus. Sickness from tetanus can be serious.  HOME CARE     Only take medicine as told by your doctor.   Clean the wound daily with mild soap and water.   Change any bandages (dressings) as told by your doctor.   Put medicated cream and a bandage on the wound as told by your doctor.   Change the bandage if it gets wet, dirty, or starts to smell.   Take showers. Do not take baths, swim, or do anything that puts your wound under water.   Rest and raise (elevate) the wound until the pain and puffiness (swelling) are better.   Keep all doctor visits as told.  GET HELP RIGHT AWAY IF:     Yellowish-white fluid (pus) comes from the wound.   Medicine does not lessen your pain.   There is a red streak going away from the wound.   You have a fever.  MAKE SURE YOU:     Understand these instructions.   Will watch your condition.   Will get help right away if you are not doing well or get worse.  Document Released: 03/26/2008 Document Revised: 09/09/2011 Document Reviewed: 10/21/2010  [employer] Patient Information 2013 [employer].

## 2012-08-03 ENCOUNTER — Ambulatory Visit (INDEPENDENT_AMBULATORY_CARE_PROVIDER_SITE_OTHER): Payer: PRIVATE HEALTH INSURANCE | Admitting: Emergency Medicine

## 2012-08-03 VITALS — BP 116/70 | HR 60 | Temp 98.0°F | Resp 16 | Ht 70.5 in | Wt 203.6 lb

## 2012-08-03 DIAGNOSIS — Z4889 Encounter for other specified surgical aftercare: Secondary | ICD-10-CM
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
## 2012-08-04 ENCOUNTER — Other Ambulatory Visit: Payer: Self-pay | Admitting: Emergency Medicine

## 2012-08-11 ENCOUNTER — Other Ambulatory Visit: Payer: Self-pay | Admitting: Internal Medicine

## 2012-08-12 ENCOUNTER — Telehealth: Payer: Self-pay | Admitting: *Deleted

## 2012-08-12 NOTE — Telephone Encounter (Signed)
[facility] requesting refill on celexa 10mg .  Last fill on 05/13/13

## 2012-08-13 NOTE — Telephone Encounter (Signed)
Electronic chart reviewed.  Need paper chart for additional review. CPE with Dr. Milus Glazier 05/2012 celexa not on the med list. Cardiology visit 05/2012 celexa on the list as patient reported. Likely OK to refill, but need paper chart to review.

## 2012-08-14 NOTE — Telephone Encounter (Signed)
Pulled chart. Patient was on Wellbutrin SR 150 please see chart.

## 2012-08-15 NOTE — Telephone Encounter (Signed)
On review of paper chart, it looks like pt was most recently on Wellbutrin.  I do not see any documentation in Epic of him being changed from wellbutrin to celexa.  I see that cardiology refilled the celexa in August 2013 with 5 RF.  Can we clarify with pt that he is in fact taking Celexa and not Wellbutrin.  And we clarify who prescribed this?  That way we can may sure the chart is accurate.

## 2012-08-16 ENCOUNTER — Telehealth: Payer: Self-pay | Admitting: Family Medicine

## 2012-08-16 MED ORDER — CITALOPRAM HYDROBROMIDE 10 MG PO TABS: 10.0000 mg | ORAL_TABLET | Freq: Every day | ORAL | Status: DC

## 2012-08-16 NOTE — Telephone Encounter (Signed)
Left message to notify rx sent in.

## 2012-08-16 NOTE — Telephone Encounter (Signed)
Spoke wit patient and clarified he is taking Celexa 10 mg 1 po qd. First thought Dr. Graciela Husbands prescribed but then thinks it was Dr. Cleta Alberts.

## 2012-08-16 NOTE — Telephone Encounter (Signed)
Celexa 10mg  sent to pharamcy with RFs

## 2012-08-25 ENCOUNTER — Encounter: Payer: PRIVATE HEALTH INSURANCE | Admitting: Emergency Medicine

## 2012-09-03 ENCOUNTER — Encounter: Payer: Self-pay | Admitting: Cardiology

## 2012-09-03 ENCOUNTER — Ambulatory Visit (INDEPENDENT_AMBULATORY_CARE_PROVIDER_SITE_OTHER): Payer: PRIVATE HEALTH INSURANCE | Admitting: Cardiology

## 2012-09-03 VITALS — BP 120/78 | HR 51 | Ht 70.5 in | Wt 201.0 lb

## 2012-09-03 DIAGNOSIS — E785 Hyperlipidemia, unspecified: Secondary | ICD-10-CM

## 2012-09-03 DIAGNOSIS — I2510 Atherosclerotic heart disease of native coronary artery without angina pectoris: Secondary | ICD-10-CM

## 2012-09-03 NOTE — Patient Instructions (Addendum)
Your physician has requested that you have an exercise tolerance test. For further information please visit [email or web address] Please also follow instruction sheet, as given.  Your physician recommends that you continue on your current medications as directed. Please refer to the Current Medication list given to you today.  Your physician recommends that you schedule a follow-up appointment in: 3 MONTHS with Dr Jens Som (previous pt of Dr Riley Kill)

## 2012-09-03 NOTE — Assessment & Plan Note (Addendum)
Continues to remain stable.  No current chest pain.  We will arrange for an exercise treadmill test since he has finished rehab.  Also has residual disease.  Will arrange before I leave.  DAPT for one year.

## 2012-09-03 NOTE — Assessment & Plan Note (Signed)
Was at [facility].  Will need follow up in fall.

## 2012-09-03 NOTE — Progress Notes (Signed)
   HPI:  This nice gentleman is in for followup visit. He's recently got a job with [employer], he seems to be in good spirits. He denies any ongoing chest pain. He has remained active, and is followed up with his primary care Dr. for physical recently. No new symptoms at the present time  Current Outpatient Prescriptions  Medication Sig Dispense Refill  . aspirin 81 MG tablet Take 81 mg by mouth daily.      . citalopram (CELEXA) 10 MG tablet Take 1 tablet (10 mg total) by mouth daily.  30 tablet  5  . clopidogrel (PLAVIX) 75 MG tablet TAKE ONE TABLET BY MOUTH EVERY DAY WITH BREAKFAST  30 tablet  3  . metoprolol tartrate (LOPRESSOR) 25 MG tablet Take 0.5 tablets (12.5 mg total) by mouth 2 (two) times daily.  60 tablet  4  . nitroGLYCERIN (NITROSTAT) 0.4 MG SL tablet Place 1 tablet (0.4 mg total) under the tongue every 5 (five) minutes as needed for chest pain.  25 tablet  3  . pravastatin (PRAVACHOL) 40 MG tablet Take 40 mg by mouth daily.       No current facility-administered medications for this visit.    No Known Allergies  Past Medical History  Diagnosis Date  . Diverticulosis     colon  . Hypertension   . Hyperlipidemia   . OSA (obstructive sleep apnea)   . Depression   . Trigeminal neuralgia 4/11    Microvascular decompression    Past Surgical History  Procedure Laterality Date  . Craniotomy  10/25/2009    for trigeminal neuralgia  . Spine surgery      lumbar x 2  . Achilles tendon repair      x 3  . Nasal sinus surgery    . Back surgery      Family History  Problem Relation Age of Onset  . Hypertension Mother   . Heart disease Mother 47  . Heart disease Brother 21    History   Social History  . Marital Status: Married    Spouse Name: Arline Asp    Number of Children: 3  . Years of Education: N/A   Occupational History  . 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 Smoking status: Never Smoker   . Smokeless tobacco: Never Used  . Alcohol Use: No    . Drug Use: Not on file  . Sexually Active: Not on file   Other Topics Concern  . Not on file   Social History Narrative   Lives in [place], [place] with wife.     ROS: Please see the HPI.  All other systems reviewed and negative.  PHYSICAL EXAM:  BP 120/78  Pulse 51  Ht 5' 10.5" (1.791 m)  Wt 201 lb (91.173 kg)  BMI 28.42 kg/m2  SpO2 96%  General: Well developed, well nourished, in no acute distress. Head:  Normocephalic and atraumatic. Neck: no JVD Lungs: Clear to auscultation and percussion. Heart: Normal S1 and S2.  No murmur, rubs or gallops.  Pulses: Pulses normal in all 4 extremities. Extremities: No clubbing or cyanosis. No edema. Neurologic: Alert and oriented x 3.  EKG:  SB.  Otherwise normal.    ASSESSMENT AND PLAN:

## 2012-09-05 NOTE — Progress Notes (Signed)
Call and have him see me for his regular PE.

## 2012-09-15 ENCOUNTER — Other Ambulatory Visit: Payer: Self-pay | Admitting: *Deleted

## 2012-09-15 MED ORDER — CLOPIDOGREL BISULFATE 75 MG PO TABS: 75.0000 mg | ORAL_TABLET | Freq: Every day | ORAL | Status: DC

## 2012-09-16 ENCOUNTER — Telehealth: Payer: Self-pay | Admitting: Cardiology

## 2012-09-16 NOTE — Telephone Encounter (Signed)
New problem   Pt's wife had called in refill and was told that stated Dr need to call in for new prescription of Clotidogiel 75mg /[facility].

## 2012-09-16 NOTE — Telephone Encounter (Signed)
Arline Asp, pts wife, is advised that pt's RX was sent to [facility] on [address] to be filled today, she verbalized understanding.

## 2012-10-01 NOTE — Telephone Encounter (Signed)
erroneous encounter. See previous message.

## 2012-10-12 ENCOUNTER — Other Ambulatory Visit (HOSPITAL_COMMUNITY): Payer: Self-pay | Admitting: Cardiology

## 2012-10-12 ENCOUNTER — Ambulatory Visit (INDEPENDENT_AMBULATORY_CARE_PROVIDER_SITE_OTHER): Payer: BC Managed Care – PPO | Admitting: Cardiology

## 2012-10-12 DIAGNOSIS — E785 Hyperlipidemia, unspecified: Secondary | ICD-10-CM

## 2012-10-12 DIAGNOSIS — I2510 Atherosclerotic heart disease of native coronary artery without angina pectoris: Secondary | ICD-10-CM

## 2012-10-12 NOTE — Procedures (Signed)
Exercise Treadmill Test  Pre-Exercise Testing Evaluation Rhythm: normal sinus  Rate: 52     Test  Exercise Tolerance Test Ordering MD: Shawnie Pons, MD  Interpreting MD: Shawnie Pons, MD  Unique Test No: 1  Treadmill:  1  Indication for ETT: cad  Contraindication to ETT: No   Stress Modality: exercise - treadmill  Cardiac Imaging Performed: non   Protocol: standard Bruce - maximal  Max BP:  163/77  Max MPHR (bpm):  161 85% MPR (bpm):  137  MPHR obtained (bpm):  133 % MPHR obtained:  83  Reached 85% MPHR (min:sec):  NA Total Exercise Time (min-sec):  12:00  Workload in METS:  13.4 Borg Scale: 12  Reason ETT Terminated:  fatigue    ST Segment Analysis At Rest: normal ST segments - no evidence of significant ST depression With Exercise: non-specific ST changes  Other Information Arrhythmia:  No Angina during ETT:  absent (0) Quality of ETT:  non-diagnostic  ETT Interpretation:  normal - no evidence of ischemia by ST analysis  Comments: The patient exercised on the Bruce protocol and completed 12 mets. He was not totally exhausted and could have gone longer.  He had no angina.  There was nonspecific J point depression that was mild, but no diagnostic ST depression at 80ms after the J point.  No angina. No late horizontal ST changes.  Formally non diagnostic but with no ischemia of significance at an excellent work load.    Recommendations:   The patient has residual CAD after stenting of the RCA.  He is doing well with Class I symptoms, and a low risk stress test.  Will continue medical therapy at this point in time.

## 2012-11-30 ENCOUNTER — Other Ambulatory Visit: Payer: Self-pay | Admitting: Physician Assistant

## 2012-12-21 ENCOUNTER — Encounter: Payer: Self-pay | Admitting: Cardiology

## 2012-12-21 ENCOUNTER — Ambulatory Visit (INDEPENDENT_AMBULATORY_CARE_PROVIDER_SITE_OTHER): Payer: BC Managed Care – PPO | Admitting: Cardiology

## 2012-12-21 VITALS — BP 110/60 | HR 60 | Ht 72.0 in | Wt 203.6 lb

## 2012-12-21 DIAGNOSIS — R55 Syncope and collapse: Secondary | ICD-10-CM

## 2012-12-21 DIAGNOSIS — I2510 Atherosclerotic heart disease of native coronary artery without angina pectoris: Secondary | ICD-10-CM

## 2012-12-21 DIAGNOSIS — E785 Hyperlipidemia, unspecified: Secondary | ICD-10-CM

## 2012-12-21 NOTE — Assessment & Plan Note (Signed)
No further recurrences.

## 2012-12-21 NOTE — Progress Notes (Signed)
HPI: 62 year old male previously followed by Dr. Riley Kill for followup of coronary artery disease. Myoview in June of 2013 showed an ejection fraction of 51%. There was felt to be diaphragmatic attenuation and possible inferior ischemia. The patient underwent catheterization in June of 2013 that showed no disease in the left main. There was a 70% LAD, 80-90% septal, and a small diagonal with a 90% ostial lesion. There was no significant disease in the circumflex. There was an 80% mid right coronary artery and the PDA had a 70% mid lesion. The patient had PCI of the right coronary artery. He has been treated medically for his LAD disease. His ejection fraction is preserved. The patient did have an EP study because of unexplained syncope in June of 2013. There was no inducible ventricular tachycardia or supraventricular tachycardia. He was last seen in March of 2014. Since that time, the patient denies any dyspnea on exertion, orthopnea, PND, pedal edema, palpitations, syncope or chest pain.   Current Outpatient Prescriptions  Medication Sig Dispense Refill  . aspirin 81 MG tablet Take 81 mg by mouth daily.      . citalopram (CELEXA) 10 MG tablet Take 1 tablet (10 mg total) by mouth daily.  30 tablet  5  . clopidogrel (PLAVIX) 75 MG tablet Take 1 tablet (75 mg total) by mouth daily.  30 tablet  11  . metoprolol tartrate (LOPRESSOR) 25 MG tablet TAKE ONE-HALF TABLET BY MOUTH TWICE DAILY  90 tablet  5  . pravastatin (PRAVACHOL) 40 MG tablet Take 40 mg by mouth daily.      . nitroGLYCERIN (NITROSTAT) 0.4 MG SL tablet Place 1 tablet (0.4 mg total) under the tongue every 5 (five) minutes as needed for chest pain.  25 tablet  3   No current facility-administered medications for this visit.     Past Medical History  Diagnosis Date  . Diverticulosis     colon  . Hypertension   . Hyperlipidemia   . OSA (obstructive sleep apnea)   . Depression   . Trigeminal neuralgia 4/11    Microvascular  decompression  . CAD (coronary artery disease)     Past Surgical History  Procedure Laterality Date  . Craniotomy  10/25/2009    for trigeminal neuralgia  . Spine surgery      lumbar x 2  . Achilles tendon repair      x 3  . Nasal sinus surgery    . Back surgery      History   Social History  . Marital Status: Married    Spouse Name: Arline Asp    Number of Children: 3  . Years of Education: N/A   Occupational History  . 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 Smoking status: Never Smoker   . Smokeless tobacco: Never Used  . Alcohol Use: No  . Drug Use: Not on file  . Sexually Active: Not on file   Other Topics Concern  . Not on file   Social History Narrative   Lives in [place], [place] with wife.     ROS: no fevers or chills, productive cough, hemoptysis, dysphasia, odynophagia, melena, hematochezia, dysuria, hematuria, rash, seizure activity, orthopnea, PND, pedal edema, claudication. Remaining systems are negative.  Physical Exam: Well-developed well-nourished in no acute distress.  Skin is warm and dry.  HEENT is normal.  Neck is supple.  Chest is clear to auscultation with normal expansion.  Cardiovascular exam is regular rate and rhythm.  Abdominal exam  nontender or distended. No masses palpated. Extremities show no edema. neuro grossly intact  Electrocardiogram shows sinus bradycardia with no ST changes.

## 2012-12-21 NOTE — Assessment & Plan Note (Signed)
Patient will check to make sure Lipitor is affordable. If it is I will discontinue Pravachol and begin 80 mg daily of Lipitor. Check lipids and liver 6 weeks later.

## 2012-12-21 NOTE — Assessment & Plan Note (Signed)
Continue aspirin, Plavix and statin. I will discontinue Plavix when he returns in 6 months if he is stable. Continue Pravachol 40 mg daily. He will check on cost of Lipitor 80 mg daily and if it is affordable we will change to high-dose statin.

## 2012-12-21 NOTE — Patient Instructions (Addendum)
Your physician wants you to follow-up in: 6 MONTHS WITH DR Jens Som You will receive a reminder letter in the mail two months in advance. If you don't receive a letter, please call our office to schedule the follow-up appointment.   LIPITOR=ATORVASTATIN 80 MG

## 2013-01-11 ENCOUNTER — Telehealth: Payer: Self-pay | Admitting: *Deleted

## 2013-01-11 DIAGNOSIS — E7800 Pure hypercholesterolemia, unspecified: Secondary | ICD-10-CM

## 2013-01-11 NOTE — Telephone Encounter (Signed)
Left message for pt to call, per dr Jens Som last office note he wanted the pt to change to lipitor 80 mg daily with lipid and liver check 6 weeks after the change.

## 2013-01-11 NOTE — Telephone Encounter (Signed)
Wants to switch from prevastatin to lipitor.

## 2013-01-19 MED ORDER — ATORVASTATIN CALCIUM 80 MG PO TABS: 80.0000 mg | ORAL_TABLET | Freq: Every day | ORAL | Status: DC

## 2013-01-19 NOTE — Telephone Encounter (Signed)
Spoke with pt wife, new script for lipitor sent to pharm. Will have labs checked in 6 weeks.

## 2013-02-22 ENCOUNTER — Other Ambulatory Visit: Payer: Self-pay | Admitting: Physician Assistant

## 2013-02-22 NOTE — Telephone Encounter (Signed)
Needs OV, first notice

## 2013-03-03 ENCOUNTER — Other Ambulatory Visit: Payer: BC Managed Care – PPO

## 2013-03-24 ENCOUNTER — Other Ambulatory Visit: Payer: Self-pay | Admitting: Cardiology

## 2013-03-30 ENCOUNTER — Other Ambulatory Visit: Payer: Self-pay | Admitting: Physician Assistant

## 2013-03-31 NOTE — Telephone Encounter (Signed)
Pt has appt sch for 06/22/13 w/ Dr Cleta Alberts, but hasn't been seen for anything but acute injury since 05/2012. Can we RF until appt?

## 2013-04-02 ENCOUNTER — Ambulatory Visit: Payer: BC Managed Care – PPO

## 2013-04-02 ENCOUNTER — Ambulatory Visit (INDEPENDENT_AMBULATORY_CARE_PROVIDER_SITE_OTHER): Payer: BC Managed Care – PPO | Admitting: Emergency Medicine

## 2013-04-02 VITALS — BP 134/82 | HR 62 | Temp 98.0°F | Resp 18 | Ht 70.5 in | Wt 203.8 lb

## 2013-04-02 DIAGNOSIS — M25561 Pain in right knee: Secondary | ICD-10-CM

## 2013-04-02 DIAGNOSIS — M25569 Pain in unspecified knee: Secondary | ICD-10-CM

## 2013-04-02 LAB — POCT CBC
Granulocyte percent: 69.4 %G (ref 37–80)
HCT, POC: 45.4 % (ref 43.5–53.7)
Hemoglobin: 14.5 g/dL (ref 14.1–18.1)
Lymph, poc: 1.6 (ref 0.6–3.4)
MCH, POC: 31 pg (ref 27–31.2)
MCHC: 31.9 g/dL (ref 31.8–35.4)
MCV: 97.1 fL — AB (ref 80–97)
MID (cbc): 0.3 (ref 0–0.9)
MPV: 10.9 fL (ref 0–99.8)
POC Granulocyte: 4.4 (ref 2–6.9)
POC LYMPH PERCENT: 25.8 %L (ref 10–50)
POC MID %: 4.8 %M (ref 0–12)
Platelet Count, POC: 206 10*3/uL (ref 142–424)
RBC: 4.68 M/uL — AB (ref 4.69–6.13)
RDW, POC: 14.4 %
WBC: 6.3 10*3/uL (ref 4.6–10.2)

## 2013-04-02 LAB — BASIC METABOLIC PANEL
BUN: 16 mg/dL (ref 6–23)
CO2: 28 mEq/L (ref 19–32)
Calcium: 9 mg/dL (ref 8.4–10.5)
Chloride: 105 mEq/L (ref 96–112)
Creat: 0.88 mg/dL (ref 0.50–1.35)
Glucose, Bld: 122 mg/dL — ABNORMAL HIGH (ref 70–99)
Potassium: 4.2 mEq/L (ref 3.5–5.3)
Sodium: 140 mEq/L (ref 135–145)

## 2013-04-02 LAB — URIC ACID: Uric Acid, Serum: 4.8 mg/dL (ref 4.0–7.8)

## 2013-04-02 LAB — POCT SEDIMENTATION RATE: POCT SED RATE: 19 mm/hr (ref 0–22)

## 2013-04-02 MED ORDER — PREDNISONE 10 MG PO TABS: ORAL_TABLET | ORAL | Status: DC

## 2013-04-02 MED ORDER — HYDROCODONE-ACETAMINOPHEN 5-325 MG PO TABS: 1.0000 | ORAL_TABLET | Freq: Four times a day (QID) | ORAL | Status: DC | PRN

## 2013-04-02 NOTE — Progress Notes (Signed)
  Subjective:    Patient ID: John Berry, male    DOB: 1951-03-10, 62 y.o.   MRN: [id number]  HPI Patient here today for severe pain in right knee. Patient states it started on Mon., 03/28/13. Patient has taken Ibuprofen ,iced,knee, and rest. He states it was better on 04/01/13, but today it is swollen with sharp pain.    Review of Systems     Objective:   Physical Exam there is significant swelling over the right knee. There is a significant effusion in the knee. There is pain with flexion and extension. There is significant tenderness at the quadriceps insertion on the superior portion of the patella . There is no joint instability noted .  [facility] reading (PRIMARY) by  Dr. Cleta Alberts patient has significant loss good slaughter's disease. He also has calcification at the attachment of the quadriceps tendon and the patellar tendon.        Assessment & Plan:  We'll check films. I suspect this may be gout. There is also a good chance it could be more of a tendinitis of the quadriceps tendon because he is doing a lot of physical labor up-and-down ladders.

## 2013-04-02 NOTE — Patient Instructions (Addendum)
Tendinitis  Tendinitis is swelling and inflammation of the tendons. Tendons are band-like tissues that connect muscle to bone. Tendinitis commonly occurs in the:    Shoulders (rotator cuff).   Heels (Achilles tendon).   Elbows (triceps tendon).  CAUSES  Tendinitis is usually caused by overusing the tendon, muscles, and joints involved. When the tissue surrounding a tendon (synovium) becomes inflamed, it is called tenosynovitis. Tendinitis commonly develops in people whose jobs require repetitive motions.  SYMPTOMS   Pain.   Tenderness.   Mild swelling.  DIAGNOSIS  Tendinitis is usually diagnosed by physical exam. Your caregiver may also order X-rays or other imaging tests.  TREATMENT  Your caregiver may recommend certain medicines or exercises for your treatment.  HOME CARE INSTRUCTIONS    Use a sling or splint for as long as directed by your caregiver until the pain decreases.   Put ice on the injured area.   Put ice in a plastic bag.   Place a towel between your skin and the bag.   Leave the ice on for 15-20 minutes, 3-4 times a day.   Avoid using the limb while the tendon is painful. Perform gentle range of motion exercises only as directed by your caregiver. Stop exercises if pain or discomfort increase, unless directed otherwise by your caregiver.   Only take over-the-counter or prescription medicines for pain, discomfort, or fever as directed by your caregiver.  SEEK MEDICAL CARE IF:    Your pain and swelling increase.   You develop new, unexplained symptoms, especially increased numbness in the hands.  MAKE SURE YOU:    Understand these instructions.   Will watch your condition.   Will get help right away if you are not doing well or get worse.  Document Released: 06/14/2000 Document Revised: 09/09/2011 Document Reviewed: 09/03/2010  Exit[employer] Patient Information 2014 [employer].

## 2013-05-07 ENCOUNTER — Ambulatory Visit: Payer: BC Managed Care – PPO | Admitting: Internal Medicine

## 2013-05-07 VITALS — BP 114/72 | HR 61 | Temp 97.5°F | Resp 18 | Ht 70.5 in | Wt 204.0 lb

## 2013-05-07 DIAGNOSIS — Z0289 Encounter for other administrative examinations: Secondary | ICD-10-CM

## 2013-05-07 NOTE — Progress Notes (Signed)
  Subjective:    Patient ID: John Berry, male    DOB: May 12, 1951, 62 y.o.   MRN: [id number]  HPI    Review of Systems     Objective:   Physical Exam        Assessment & Plan:

## 2013-05-07 NOTE — Progress Notes (Signed)
  Subjective:    Patient ID: John Berry, male    DOB: [birth year], 62 y.o.   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  Pt had a stent placed in 2013 by Dr. Jens Som who is his heart doctor, he has an appt in December.  Pt is taking Lipitor and Plavix daily. Blood pressure had been normal 114/72. Has Stress test in April 2014, EKG and cardiology visit in June 2014, lost 35lbs exercising and eating healthy. He feels great and works hard work. All tests normal and cleared for work.   Review of Systems     Objective:   Physical Exam  Constitutional: He is oriented to person, place, and time. He appears well-developed and well-nourished.  HENT:  Head: Normocephalic.  Right Ear: External ear normal.  Left Ear: External ear normal.  Mouth/Throat: Oropharynx is clear and moist.  Eyes: Conjunctivae and EOM are normal. Pupils are equal, round, and reactive to light.  Neck: Normal range of motion.  Cardiovascular: Normal rate, regular rhythm, normal heart sounds and intact distal pulses.  Exam reveals no gallop and no friction rub.   No murmur heard. Pulmonary/Chest: Effort normal and breath sounds normal.  Abdominal: Soft. Bowel sounds are normal. There is no tenderness.  Genitourinary: Penis normal.  Musculoskeletal: Normal range of motion.  Neurological: He is alert and oriented to person, place, and time. He has normal reflexes. No cranial nerve deficit. Coordination normal.  Skin: Skin is warm.  Psychiatric: He has a normal mood and affect. His behavior is normal. Judgment and thought content normal.          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

## 2013-05-07 NOTE — Progress Notes (Signed)
UA-sp gr-1.015, glucose-neg, protein-neg, blood-neg

## 2013-05-07 NOTE — Patient Instructions (Signed)
Immunization Schedule, Adult  Influenza vaccine.  All adults should be immunized every year.  All adults, including pregnant women and people with hives-only allergy to eggs can receive the inactivated influenza (IIV) vaccine.  Adults aged 60 49 years can receive the recombinant influenza (RIV) vaccine. The RIV vaccine does not contain any egg protein.  Adults aged 65 years or older can receive the standard-dose IIV or the high-dose IIV.  Tetanus, diphtheria, and acellular pertussis (Td, Tdap) vaccine.  Pregnant women should receive 1 dose of Tdap vaccine during each pregnancy. The dose should be obtained regardless of the length of time since the last dose. Immunization is preferred during the 27th to 36th week of gestation.  An adult who has not previously received Tdap or who does not know his or her vaccine status should receive 1 dose of Tdap. This initial dose should be followed by tetanus and diphtheria toxoids (Td) booster doses every 10 years.  Adults with an unknown or incomplete history of completing a 3-dose immunization series with Td-containing vaccines should begin or complete a primary immunization series including a Tdap dose.  Adults should receive a Td booster every 10 years.  Varicella vaccine.  An adult without evidence of immunity to varicella should receive 2 doses or a second dose if he or she has previously received 1 dose.  Pregnant females who do not have evidence of immunity should receive the first dose after pregnancy. This first dose should be obtained before leaving the health care facility. The second dose should be obtained 4 8 weeks after the first dose.  Human papillomavirus (HPV) vaccine.  Females aged 13 26 years who have not received the vaccine previously should obtain the 3-dose series.  The vaccine is not recommended for use in pregnant females. However, pregnancy testing is not needed before receiving a dose. If a male is found to be  pregnant after receiving a dose, no treatment is needed. In that case, the remaining doses should be delayed until after the pregnancy.  Males aged 13 21 years who have not received the vaccine previously should receive the 3-dose series. Males aged 22 26 years may be immunized.  Immunization is recommended through the age of 26 years for any male who has sex with males and did not get any or all doses earlier.  Immunization is recommended for any person with an immunocompromised condition through the age of 26 years if he or she did not get any or all doses earlier.  During the 3-dose series, the second dose should be obtained 4 8 weeks after the first dose. The third dose should be obtained 24 weeks after the first dose and 16 weeks after the second dose.  Zoster vaccine.  One dose is recommended for adults aged 60 years or older unless certain conditions are present.  Measles, mumps, and rubella (MMR) vaccine.  Adults born before 1957 generally are considered immune to measles and mumps.  Adults born in 1957 or later should have 1 or more doses of MMR vaccine unless there is a contraindication to the vaccine or there is laboratory evidence of immunity to each of the three diseases.  A routine second dose of MMR vaccine should be obtained at least 28 days after the first dose for students attending postsecondary schools, health care workers, or international travelers.  People who received inactivated measles vaccine or an unknown type of measles vaccine during 1963 1967 should receive 2 doses of MMR vaccine.  People who received   inactivated mumps vaccine or an unknown type of mumps vaccine before 1979 and are at high risk for mumps infection should consider immunization with 2 doses of MMR vaccine.  For females of childbearing age, rubella immunity should be determined. If there is no evidence of immunity, females who are not pregnant should be vaccinated. If there is no evidence of  immunity, females who are pregnant should delay immunization until after pregnancy.  Unvaccinated health care workers born before 1957 who lack laboratory evidence of measles, mumps, or rubella immunity or laboratory confirmation of disease should consider measles and mumps immunization with 2 doses of MMR vaccine or rubella immunization with 1 dose of MMR vaccine.  Pneumococcal 13-valent conjugate (PCV13) vaccine.  When indicated, a person who is uncertain of his or her immunization history and has no record of immunization should receive the PCV13 vaccine.  An adult aged 19 years or older who has certain medical conditions and has not been previously immunized should receive 1 dose of PCV13 vaccine. This PCV13 should be followed with a dose of pneumococcal polysaccharide (PPSV23) vaccine. The PPSV23 vaccine dose should be obtained at least 8 weeks after the dose of PCV13 vaccine.  An adult aged 19 years or older who has certain medical conditions and previously received 1 or more doses of PPSV23 vaccine should receive 1 dose of PCV13. The PCV13 vaccine dose should be obtained 1 or more years after the last PPSV23 vaccine dose.  Pneumococcal polysaccharide (PPSV23) vaccine.  When PCV13 is also indicated, PCV13 should be obtained first.  All adults aged 65 years and older should be immunized.  An adult younger than age 65 years who has certain medical conditions should be immunized.  Any person who resides in a nursing home or long-term care facility should be immunized.  An adult smoker should be immunized.  People with an immunocompromised condition and certain other conditions should receive both PCV13 and PPSV23 vaccines.  People with human immunodeficiency virus (HIV) infection should be immunized as soon as possible after diagnosis.  Immunization during chemotherapy or radiation therapy should be avoided.  Routine use of PPSV23 vaccine is not recommended for American Indians,  [place] Natives, or people younger than 65 years unless there are medical conditions that require PPSV23 vaccine.  When indicated, people who have unknown immunization and have no record of immunization should receive PPSV23 vaccine.  One-time revaccination 5 years after the first dose of PPSV23 is recommended for people aged 19 64 years who have chronic kidney failure, nephrotic syndrome, asplenia, or immunocompromised conditions.  People who received 1 2 doses of PPSV23 before age 65 years should receive another dose of PPSV23 vaccine at age 65 years or later if at least 5 years have passed since the previous dose.  Doses of PPSV23 are not needed for people immunized with PPSV23 at or after age 65 years.  Meningococcal vaccine.  Adults with asplenia or persistent complement component deficiencies should receive 2 doses of quadrivalent meningococcal conjugate (MenACWY-D) vaccine. The doses should be obtained at least 2 months apart.  Microbiologists working with certain meningococcal bacteria, military recruits, people at risk during an outbreak, and people who travel to or live in countries with a high rate of meningitis should be immunized.  A first-year college student up through age 21 years who is living in a residence hall should receive a dose if he or she did not receive a dose on or after his or her 16th birthday.  Adults who have   certain high-risk conditions should receive one or more doses of vaccine.  Hepatitis A vaccine.  Adults who wish to be protected from this disease, have certain high-risk conditions, work with hepatitis A-infected animals, work in hepatitis A research labs, or travel to or work in countries with a high rate of hepatitis A should be immunized.  Adults who were previously unvaccinated and who anticipate close contact with an international adoptee during the first 60 days after arrival in the [place] from a country with a high rate of hepatitis A should  be immunized.  Hepatitis B vaccine.  Adults who wish to be protected from this disease, have certain high-risk conditions, may be exposed to blood or other infectious body fluids, are household contacts or sex partners of hepatitis B positive people, are clients or workers in certain care facilities, or travel to or work in countries with a high rate of hepatitis B should be immunized.  Haemophilus influenzae type b (Hib) vaccine.  A previously unvaccinated person with asplenia or sickle cell disease or having a scheduled splenectomy should receive 1 dose of Hib vaccine.  Regardless of previous immunization, a recipient of a hematopoietic stem cell transplant should receive a 3-dose series 6 12 months after his or her successful transplant.  Hib vaccine is not recommended for adults with HIV infection. Document Released: 09/07/2003 Document Revised: 10/12/2012 Document Reviewed: 08/04/2012 [employer] Patient Information 2014 [employer].  

## 2013-06-22 ENCOUNTER — Encounter: Payer: Self-pay | Admitting: Emergency Medicine

## 2013-06-22 ENCOUNTER — Ambulatory Visit (INDEPENDENT_AMBULATORY_CARE_PROVIDER_SITE_OTHER): Payer: BC Managed Care – PPO | Admitting: Emergency Medicine

## 2013-06-22 VITALS — BP 131/84 | HR 48 | Temp 97.1°F | Resp 16 | Ht 71.0 in | Wt 208.0 lb

## 2013-06-22 DIAGNOSIS — R4589 Other symptoms and signs involving emotional state: Secondary | ICD-10-CM

## 2013-06-22 DIAGNOSIS — I519 Heart disease, unspecified: Secondary | ICD-10-CM

## 2013-06-22 DIAGNOSIS — Z23 Encounter for immunization: Secondary | ICD-10-CM

## 2013-06-22 DIAGNOSIS — I2510 Atherosclerotic heart disease of native coronary artery without angina pectoris: Secondary | ICD-10-CM

## 2013-06-22 LAB — POCT URINALYSIS DIPSTICK
Bilirubin, UA: NEGATIVE
Blood, UA: NEGATIVE
Glucose, UA: NEGATIVE
Ketones, UA: NEGATIVE
Leukocytes, UA: NEGATIVE
Nitrite, UA: NEGATIVE
Protein, UA: NEGATIVE
Spec Grav, UA: 1.025
Urobilinogen, UA: 0.2
pH, UA: 5.5

## 2013-06-22 LAB — COMPLETE METABOLIC PANEL WITH GFR
ALT: 13 U/L (ref 0–53)
AST: 18 U/L (ref 0–37)
Albumin: 3.8 g/dL (ref 3.5–5.2)
Alkaline Phosphatase: 36 U/L — ABNORMAL LOW (ref 39–117)
BUN: 17 mg/dL (ref 6–23)
CO2: 25 mEq/L (ref 19–32)
Calcium: 8.9 mg/dL (ref 8.4–10.5)
Chloride: 109 mEq/L (ref 96–112)
Creat: 0.8 mg/dL (ref 0.50–1.35)
GFR, Est African American: >89 mL/min
GFR, Est Non African American: >89 mL/min
Glucose, Bld: 95 mg/dL (ref 70–99)
Potassium: 4.3 mEq/L (ref 3.5–5.3)
Sodium: 142 mEq/L (ref 135–145)
Total Bilirubin: 0.6 mg/dL (ref 0.3–1.2)
Total Protein: 6.1 g/dL (ref 6.0–8.3)

## 2013-06-22 LAB — CBC WITH DIFFERENTIAL/PLATELET
Basophils Absolute: 0 10*3/uL (ref 0.0–0.1)
Basophils Relative: 0 % (ref 0–1)
Eosinophils Absolute: 0.1 10*3/uL (ref 0.0–0.7)
Eosinophils Relative: 3 % (ref 0–5)
HCT: 42.4 % (ref 39.0–52.0)
Hemoglobin: 15 g/dL (ref 13.0–17.0)
Lymphocytes Relative: 42 % (ref 12–46)
Lymphs Abs: 2 10*3/uL (ref 0.7–4.0)
MCH: 30.7 pg (ref 26.0–34.0)
MCHC: 35.4 g/dL (ref 30.0–36.0)
MCV: 86.7 fL (ref 78.0–100.0)
Monocytes Absolute: 0.4 10*3/uL (ref 0.1–1.0)
Monocytes Relative: 8 % (ref 3–12)
Neutro Abs: 2.2 10*3/uL (ref 1.7–7.7)
Neutrophils Relative %: 47 % (ref 43–77)
Platelets: 214 10*3/uL (ref 150–400)
RBC: 4.89 MIL/uL (ref 4.22–5.81)
RDW: 14.3 % (ref 11.5–15.5)
WBC: 4.8 10*3/uL (ref 4.0–10.5)

## 2013-06-22 LAB — LIPID PANEL
Cholesterol: 131 mg/dL (ref 0–200)
HDL: 46 mg/dL (ref >39)
LDL Cholesterol: 69 mg/dL (ref 0–99)
Total CHOL/HDL Ratio: 2.8 Ratio
Triglycerides: 78 mg/dL (ref <150)
VLDL: 16 mg/dL (ref 0–40)

## 2013-06-22 LAB — TSH: TSH: 1.64 u[IU]/mL (ref 0.350–4.500)

## 2013-06-22 LAB — IFOBT (OCCULT BLOOD): IFOBT: NEGATIVE

## 2013-06-22 MED ORDER — ZOSTER VACCINE LIVE 19400 UNT/0.65ML ~~LOC~~ SOLR: 0.6500 mL | Freq: Once | SUBCUTANEOUS | Status: DC

## 2013-06-22 NOTE — Progress Notes (Signed)
   Subjective:    Patient ID: John Berry, male    DOB: [birth year], 62 y.o.   MRN: [id number]  HPI    Review of Systems  Musculoskeletal: Positive for arthralgias.       Objective:   Physical Exam HEENT exam is unremarkable neck is supple chest clear heart regular rate no murmurs abdomen soft nontender liver spleen are not enlarged. Extremities without cyanosis clubbing or edema. Prostate is normal smooth not enlarged .  Results for orders placed in visit on 06/22/13  IFOBT (OCCULT BLOOD)      Result Value Range   IFOBT Negative    POCT URINALYSIS DIPSTICK      Result Value Range   Color, UA yellow     Clarity, UA clear     Glucose, UA neg     Bilirubin, UA neg     Ketones, UA neg     Spec Grav, UA 1.025     Blood, UA neg     pH, UA 5.5     Protein, UA neg     Urobilinogen, UA 0.2     Nitrite, UA neg     Leukocytes, UA Negative          Assessment & Plan:  He is up-to-date on his immunizations. He was given a prescription to get his ostomy bag. He has RA had a flu shot for this year. He is due to followup with his cardiologist next week

## 2013-07-05 ENCOUNTER — Other Ambulatory Visit: Payer: Self-pay | Admitting: Physician Assistant

## 2013-07-06 NOTE — Telephone Encounter (Signed)
Pt was seen in December for follow up. No mention of refills. Ok to refill 6 months?

## 2013-07-06 NOTE — Telephone Encounter (Signed)
Okay to refill medication for one year. He just had his physical the end of December

## 2013-08-08 ENCOUNTER — Encounter: Payer: Self-pay | Admitting: Internal Medicine

## 2013-08-16 ENCOUNTER — Encounter: Payer: Self-pay | Admitting: Internal Medicine

## 2013-09-30 ENCOUNTER — Other Ambulatory Visit: Payer: Self-pay | Admitting: Cardiology

## 2013-10-07 ENCOUNTER — Encounter: Payer: Self-pay | Admitting: Internal Medicine

## 2013-11-03 ENCOUNTER — Other Ambulatory Visit: Payer: Self-pay | Admitting: *Deleted

## 2013-11-03 MED ORDER — CLOPIDOGREL BISULFATE 75 MG PO TABS: ORAL_TABLET | ORAL | Status: DC

## 2013-11-04 ENCOUNTER — Other Ambulatory Visit: Payer: Self-pay | Admitting: Cardiology

## 2013-11-12 ENCOUNTER — Encounter: Payer: Self-pay | Admitting: *Deleted

## 2013-11-12 ENCOUNTER — Telehealth: Payer: Self-pay | Admitting: *Deleted

## 2013-11-12 NOTE — Telephone Encounter (Signed)
DC plavix Lelon Perla

## 2013-11-12 NOTE — Telephone Encounter (Signed)
11/12/2013   RE: John Berry DOB: 09/19/1950 MRN: [id number]   Dear Dr Stanford Breed,    We have scheduled the above patient for an endoscopic procedure. Our records show that he is on anticoagulation therapy.   Please advise as to how long the patient may come off his therapy of Plavix prior to the procedure, which is scheduled for 11-29-2013.  Please fax back/ or route the completed form to Evette Georges., CMA.   Sincerely,    Carlyle Dolly

## 2013-11-15 NOTE — Telephone Encounter (Signed)
How long do we DC Plavix? Norm is 5 days, is that okay?

## 2013-11-15 NOTE — Telephone Encounter (Signed)
Plavix can be DCed for good [employer]

## 2013-11-15 NOTE — Telephone Encounter (Signed)
Left message for pt to call.

## 2013-11-15 NOTE — Telephone Encounter (Signed)
Could you please have your staff notify the patient that their Plavix can be stopped/DCed, Im not in authority to tell patient to stop Plavix completely.

## 2013-11-16 ENCOUNTER — Telehealth: Payer: Self-pay | Admitting: Cardiology

## 2013-11-16 NOTE — Telephone Encounter (Signed)
New problem   Pt was told to call back today to discuss his medication before his colonoscopy.

## 2013-11-16 NOTE — Telephone Encounter (Signed)
Patient called regarding his Plavix and his upcoming Colonoscopy on June 1st. Dr. Stanford Breed advises that patient may discontinue Plavix for good. Informed patient and he verbalized understanding and agreement. Stated he will see [place] in July at his next appointment.

## 2013-11-16 NOTE — Telephone Encounter (Signed)
Patient called and stated Dr. Stanford Breed office's called him and told him to stop his Plavix.

## 2013-11-18 ENCOUNTER — Ambulatory Visit (INDEPENDENT_AMBULATORY_CARE_PROVIDER_SITE_OTHER): Payer: BC Managed Care – PPO | Admitting: Gastroenterology

## 2013-11-18 ENCOUNTER — Encounter: Payer: Self-pay | Admitting: Internal Medicine

## 2013-11-18 ENCOUNTER — Encounter: Payer: Self-pay | Admitting: Gastroenterology

## 2013-11-18 VITALS — BP 112/76 | HR 64 | Ht 71.0 in | Wt 214.4 lb

## 2013-11-18 DIAGNOSIS — Z86010 Personal history of colonic polyps: Secondary | ICD-10-CM

## 2013-11-18 MED ORDER — NA SULFATE-K SULFATE-MG SULF 17.5-3.13-1.6 GM/177ML PO SOLN: ORAL | Status: DC

## 2013-11-18 NOTE — Patient Instructions (Signed)

## 2013-11-18 NOTE — Progress Notes (Signed)
11/18/2013 John Berry [id number] 12-02-1950   HISTORY OF PRESENT ILLNESS:  Patient is a pleasant 63 year old male who presents to our office today in order to schedule a surveillance colonoscopy.  His last colonoscopy was in 08/2010 by Dr. Carlean Purl at which time he was found to have 5 polyps that were removed (all tubular adenomas), moderate diverticulosis, and internal hemorrhoids.  He denies any complaints.  Other medical issues are stable.  He was on Plavix, but that was recently discontinue altogether by his cardiologist.   Past Medical History  Diagnosis Date  . Diverticulosis   . Hypertension   . Hyperlipidemia   . OSA (obstructive sleep apnea)   . Depression   . Trigeminal neuralgia 4/11    Microvascular decompression  . CAD (coronary artery disease)   . Personal history of colonic polyps - adenomas 08/13/2010    TUBULAR ADENOMAS (X4)   Past Surgical History  Procedure Laterality Date  . Craniotomy  10/25/2009    for trigeminal neuralgia  . Spine surgery      lumbar x 2  . Achilles tendon repair      x 3  . Nasal sinus surgery    . Back surgery    . Colonoscopy  08/13/2010    reports that he has never smoked. He has never used smokeless tobacco. He reports that he does not drink alcohol or use illicit drugs. family history includes Heart disease (age of onset: 67) in his brother and mother; Hypertension in his mother. No Known Allergies    Outpatient Encounter Prescriptions as of 11/18/2013  Medication Sig  . aspirin 81 MG tablet Take 81 mg by mouth daily.  Marland Kitchen atorvastatin (LIPITOR) 80 MG tablet Take 1 tablet (80 mg total) by mouth daily.  . metoprolol tartrate (LOPRESSOR) 25 MG tablet TAKE ONE-HALF TABLET BY MOUTH TWICE DAILY  . NITROSTAT 0.4 MG SL tablet DISSOLVE ONE TABLET UNDER THE TONGUE EVERY 5 MINUTES AS NEEDED FOR CHEST PAIN  . zoster vaccine live, PF, (ZOSTAVAX) 56979 UNT/0.65ML injection Inject 19,400 Units into the skin once.  . Na Sulfate-K Sulfate-Mg  Sulf (SUPREP BOWEL PREP) SOLN USE PER PREP INSTRUCTIONS  . [DISCONTINUED] citalopram (CELEXA) 10 MG tablet TAKE ONE TABLET BY MOUTH ONCE DAILY. NEEDS TO BE SEEN  . [DISCONTINUED] clopidogrel (PLAVIX) 75 MG tablet TAKE ONE TABLET BY MOUTH ONCE DAILY.     REVIEW OF SYSTEMS  : All other systems reviewed and negative except where noted in the History of Present Illness.   PHYSICAL EXAM: BP 112/76  Pulse 64  Ht 5\' 11"  (1.803 m)  Wt 214 lb 6.4 oz (97.251 kg)  BMI 29.92 kg/m2 General: Well developed white male in no acute distress Head: Normocephalic and atraumatic Eyes:  Sclerae anicteric, conjunctiva pink. Ears: Normal auditory acuity. Lungs: Clear throughout to auscultation Heart: Regular rate and rhythm Abdomen: Soft, non-distended.  Normal bowel sounds.  Non-tender. Rectal:  Deferred.  Will be done at the time of colonoscopy. Musculoskeletal: Symmetrical with no gross deformities  Skin: No lesions on visible extremities Extremities: No edema  Neurological: Alert oriented x 4, grossly non-focal Psychological:  Alert and cooperative. Normal mood and affect  ASSESSMENT AND PLAN: -Personal history of colon polyps:  Will schedule colonoscopy with Dr. Carlean Purl.  The risks, benefits, and alternatives were discussed with the patient and she consents to proceed.

## 2013-11-29 ENCOUNTER — Encounter: Payer: Self-pay | Admitting: Internal Medicine

## 2013-11-29 ENCOUNTER — Ambulatory Visit: Payer: BC Managed Care – PPO | Admitting: Internal Medicine

## 2013-11-29 VITALS — BP 111/68 | HR 46 | Temp 96.5°F | Resp 20 | Ht 71.0 in | Wt 214.0 lb

## 2013-11-29 DIAGNOSIS — Z86010 Personal history of colonic polyps: Secondary | ICD-10-CM

## 2013-11-29 DIAGNOSIS — D126 Benign neoplasm of colon, unspecified: Secondary | ICD-10-CM

## 2013-11-29 MED ORDER — SODIUM CHLORIDE 0.9 % IV SOLN: 500.0000 mL | INTRAVENOUS | Status: DC

## 2013-11-29 NOTE — Op Note (Signed)
[place]  [address] [name], [id number]   COLONOSCOPY PROCEDURE REPORT  PATIENT: John Berry, John Berry  MR#: [id number] BIRTHDATE: [birth year] , 62  yrs. old GENDER: Male ENDOSCOPIST: Gatha Mayer, MD, [facility] REFERRED BY: PROCEDURE DATE:  11/29/2013 PROCEDURE:   Colonoscopy with biopsy and snare polypectomy First Screening Colonoscopy - Avg.  risk and is 50 yrs.  old or older - No.  Prior Negative Screening - Now for repeat screening. N/A  History of Adenoma - Now for follow-up colonoscopy & has been > or = to 3 yrs.  Yes hx of adenoma.  Has been 3 or more years since last colonoscopy. ASA CLASS:   Class II INDICATIONS:Patient's personal history of adenomatous colon polyps.  MEDICATIONS: Propofol (Diprivan) 340 mg IV, MAC sedation, administered by CRNA, and These medications were titrated to patient response per physician's verbal order  DESCRIPTION OF PROCEDURE:   After the risks benefits and alternatives of the procedure were thoroughly explained, informed consent was obtained.  A digital rectal exam revealed no abnormalities of the rectum.   The     endoscope was introduced through the anus and advanced to the cecum, which was identified by both the appendix and ileocecal valve. No adverse events experienced.   The quality of the prep was excellent using Suprep The instrument was then slowly withdrawn as the colon was fully examined.  COLON FINDINGS: Five sessile polyps measuring 2-7 mm in size were found in the ascending colon, transverse colon, descending colon, and sigmoid colon.  A polypectomy was performed with cold forceps and with a cold snare.  The resection was complete and the polyp tissue was completely retrieved.   Moderate diverticulosis was noted The finding was in the left colon.   The colon mucosa was otherwise normal.  Retroflexed views revealed no abnormalities. The time to cecum=1 minutes 37 seconds.  Withdrawal time=14 minutes  01 seconds.  The scope was withdrawn and the procedure completed. COMPLICATIONS: There were no complications.  ENDOSCOPIC IMPRESSION: 1.   Five sessile polyps measuring 2-7 mm in size were found in the ascending colon, transverse colon, descending colon, and sigmoid colon; polypectomy was performed with cold forceps and with a cold snare 2.   Moderate diverticulosis was noted in the left colon 3.   The colon mucosa was otherwise normal  RECOMMENDATIONS: Timing of repeat colonoscopy will be determined by pathology findings.   eSigned:  Gatha Mayer, MD, [facility] 11/29/2013 9:50 AM  cc: The Patient

## 2013-11-29 NOTE — Patient Instructions (Addendum)
I found and removed 4 polyps - they look benign. You also have a condition called diverticulosis - common and not usually a problem. Please read the handout provided.  I will let you know pathology results and when to have another routine colonoscopy by mail.  I appreciate the opportunity to care for you. Gatha Mayer, MD, FACG   OK TO START PLAVIX/ASPIRIN FROM DR GESSNER'S STANDPOINT FOLLOW CARDIOLOGIST INSTRUCTION   YOU HAD AN ENDOSCOPIC PROCEDURE TODAY AT [facility]: Refer to the procedure report that was given to you for any specific questions about what was found during the examination.  If the procedure report does not answer your questions, please call your gastroenterologist to clarify.  If you requested that your care partner not be given the details of your procedure findings, then the procedure report has been included in a sealed envelope for you to review at your convenience later.  YOU SHOULD EXPECT: Some feelings of bloating in the abdomen. Passage of more gas than usual.  Walking can help get rid of the air that was put into your GI tract during the procedure and reduce the bloating. If you had a lower endoscopy (such as a colonoscopy or flexible sigmoidoscopy) you may notice spotting of blood in your stool or on the toilet paper. If you underwent a bowel prep for your procedure, then you may not have a normal bowel movement for a few days.  DIET: Your first meal following the procedure should be a light meal and then it is ok to progress to your normal diet.  A half-sandwich or bowl of soup is an example of a good first meal.  Heavy or fried foods are harder to digest and may make you feel nauseous or bloated.  Likewise meals heavy in dairy and vegetables can cause extra gas to form and this can also increase the bloating.  Drink plenty of fluids but you should avoid alcoholic beverages for 24 hours.  ACTIVITY: Your care partner should take you home directly  after the procedure.  You should plan to take it easy, moving slowly for the rest of the day.  You can resume normal activity the day after the procedure however you should NOT DRIVE or use heavy machinery for 24 hours (because of the sedation medicines used during the test).    SYMPTOMS TO REPORT IMMEDIATELY: A gastroenterologist can be reached at any hour.  During normal business hours, 8:30 AM to 5:00 PM Monday through Friday, call [phone].  After hours and on weekends, please call the GI answering service at [phone] who will take a message and have the physician on call contact you.   Following lower endoscopy (colonoscopy or flexible sigmoidoscopy):  Excessive amounts of blood in the stool  Significant tenderness or worsening of abdominal pains  Swelling of the abdomen that is new, acute  Fever of 100F or higher   FOLLOW UP: If any biopsies were taken you will be contacted by phone or by letter within the next 1-3 weeks.  Call your gastroenterologist if you have not heard about the biopsies in 3 weeks.  Our staff will call the home number listed on your records the next business day following your procedure to check on you and address any questions or concerns that you may have at that time regarding the information given to you following your procedure. This is a courtesy call and so if there is no answer at the home number and  we have not heard from you through the emergency physician on call, we will assume that you have returned to your regular daily activities without incident.  SIGNATURES/CONFIDENTIALITY: You and/or your care partner have signed paperwork which will be entered into your electronic medical record.  These signatures attest to the fact that that the information above on your After Visit Summary has been reviewed and is understood.  Full responsibility of the confidentiality of this discharge information lies with you and/or your care-partner.   INFORMATION  ON POLYPS AND DIVERTICULOSIS GIVEN TO YOU TODAY

## 2013-11-29 NOTE — Progress Notes (Signed)
Alphonsa Gin, RN started IV in rt wrist successfully.  She could connect IV tubing to the IV port.  IV removed and restarted in right forearm successfully.  The problem was the IV tubing had a notch of plastic on the side of the tubing end.  She changed out the tubing and the other tubing work great.  Alphonsa Gin, RN appoligized to the pt.  Pt understood and very cooperative. Maw

## 2013-11-29 NOTE — Progress Notes (Signed)
Called to room to assist during endoscopic procedure.  Patient ID and intended procedure confirmed with present staff. Received instructions for my participation in the procedure from the performing physician.  

## 2013-11-30 ENCOUNTER — Telehealth: Payer: Self-pay | Admitting: *Deleted

## 2013-11-30 NOTE — Telephone Encounter (Signed)
No answer, left message to call if questions or concerns. 

## 2013-12-06 ENCOUNTER — Encounter: Payer: Self-pay | Admitting: Internal Medicine

## 2014-01-06 ENCOUNTER — Ambulatory Visit (INDEPENDENT_AMBULATORY_CARE_PROVIDER_SITE_OTHER): Payer: BC Managed Care – PPO | Admitting: Cardiology

## 2014-01-06 ENCOUNTER — Encounter: Payer: Self-pay | Admitting: Cardiology

## 2014-01-06 VITALS — BP 120/80 | HR 62 | Ht 71.0 in | Wt 210.0 lb

## 2014-01-06 DIAGNOSIS — I2510 Atherosclerotic heart disease of native coronary artery without angina pectoris: Secondary | ICD-10-CM

## 2014-01-06 DIAGNOSIS — I2584 Coronary atherosclerosis due to calcified coronary lesion: Secondary | ICD-10-CM

## 2014-01-06 DIAGNOSIS — E785 Hyperlipidemia, unspecified: Secondary | ICD-10-CM

## 2014-01-06 MED ORDER — METOPROLOL TARTRATE 25 MG PO TABS
12.5000 mg | ORAL_TABLET | Freq: Two times a day (BID) | ORAL | Status: DC
Start: 2014-01-06 — End: 2015-02-20

## 2014-01-06 NOTE — Assessment & Plan Note (Signed)
Continue aspirin, beta blocker and statin.

## 2014-01-06 NOTE — Patient Instructions (Signed)
Your physician wants you to follow-up in: ONE YEAR WITH DR CRENSHAW You will receive a reminder letter in the mail two months in advance. If you don't receive a letter, please call our office to schedule the follow-up appointment.  

## 2014-01-06 NOTE — Progress Notes (Signed)
HPI: FU coronary artery disease. Myoview in June of 2013 showed an ejection fraction of 51%. There was felt to be diaphragmatic attenuation and possible inferior ischemia. The patient underwent catheterization in June of 2013 that showed no disease in the left main. There was a 70% LAD, 80-90% septal, and a small diagonal with a 90% ostial lesion. There was no significant disease in the circumflex. There was an 80% mid right coronary artery and the PDA had a 70% mid lesion. The patient had PCI of the right coronary artery. He has been treated medically for his LAD disease. His ejection fraction is preserved. The patient did have an EP study because of unexplained syncope in June of 2013. There was no inducible ventricular tachycardia or supraventricular tachycardia. He was last seen in June of 2014. Since that time, the patient denies any dyspnea on exertion, orthopnea, PND, pedal edema, palpitations, syncope or chest pain.   Current Outpatient Prescriptions  Medication Sig Dispense Refill  . aspirin 81 MG tablet Take 81 mg by mouth daily.      Marland Kitchen atorvastatin (LIPITOR) 80 MG tablet Take 1 tablet (80 mg total) by mouth daily.  90 tablet  3  . metoprolol tartrate (LOPRESSOR) 25 MG tablet TAKE ONE-HALF TABLET BY MOUTH TWICE DAILY  90 tablet  5  . NITROSTAT 0.4 MG SL tablet DISSOLVE ONE TABLET UNDER THE TONGUE EVERY 5 MINUTES AS NEEDED FOR CHEST PAIN  25 tablet  1   No current facility-administered medications for this visit.     Past Medical History  Diagnosis Date  . Diverticulosis   . Hypertension   . Hyperlipidemia   . OSA (obstructive sleep apnea)   . Depression   . Trigeminal neuralgia 4/11    Microvascular decompression  . CAD (coronary artery disease)   . Personal history of colonic polyps - adenomas 08/13/2010    TUBULAR ADENOMAS (X4)  . History of heart artery stent     Past Surgical History  Procedure Laterality Date  . Craniotomy  10/25/2009    for trigeminal neuralgia   . Spine surgery      lumbar x 2  . Achilles tendon repair      x 3  . Nasal sinus surgery    . Back surgery    . Colonoscopy  08/13/2010  . Uvla platoplasty      History   Social History  . Marital Status: Married    Spouse Name: Jenny Reichmann    Number of Children: 3  . Years of Education: N/A   Occupational History  . 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 Smoking status: Never Smoker   . Smokeless tobacco: Never Used  . Alcohol Use: No  . Drug Use: No  . Sexual Activity: Yes   Other Topics Concern  . Not on file   Social History Narrative   Lives in [place], [place] with wife.     ROS: no fevers or chills, productive cough, hemoptysis, dysphasia, odynophagia, melena, hematochezia, dysuria, hematuria, rash, seizure activity, orthopnea, PND, pedal edema, claudication. Remaining systems are negative.  Physical Exam: Well-developed well-nourished in no acute distress.  Skin is warm and dry.  HEENT is normal.  Neck is supple.  Chest is clear to auscultation with normal expansion.  Cardiovascular exam is regular rate and rhythm.  Abdominal exam nontender or distended. No masses palpated. Extremities show no edema. neuro grossly intact  ECG Sinus rhythm at a rate of 62.  No ST changes.

## 2014-01-06 NOTE — Assessment & Plan Note (Signed)
Continue statin. 

## 2014-03-11 ENCOUNTER — Other Ambulatory Visit: Payer: Self-pay

## 2014-03-11 DIAGNOSIS — E7800 Pure hypercholesterolemia, unspecified: Secondary | ICD-10-CM

## 2014-03-11 MED ORDER — ATORVASTATIN CALCIUM 80 MG PO TABS
80.0000 mg | ORAL_TABLET | Freq: Every day | ORAL | Status: DC
Start: 2014-03-11 — End: 2015-03-21

## 2014-05-12 ENCOUNTER — Ambulatory Visit: Payer: BC Managed Care – PPO | Admitting: *Deleted

## 2014-05-12 ENCOUNTER — Ambulatory Visit (INDEPENDENT_AMBULATORY_CARE_PROVIDER_SITE_OTHER): Payer: Self-pay | Admitting: Emergency Medicine

## 2014-05-12 VITALS — BP 126/80 | HR 60 | Temp 98.2°F | Resp 17 | Ht 70.5 in | Wt 213.0 lb

## 2014-05-12 DIAGNOSIS — Z23 Encounter for immunization: Secondary | ICD-10-CM

## 2014-05-12 DIAGNOSIS — Z021 Encounter for pre-employment examination: Secondary | ICD-10-CM

## 2014-05-12 NOTE — Progress Notes (Signed)
[facility] [address] [place] [address] [phone]  Date:  05/12/2014   Name:  John Berry   DOB:  August 05, 1950   MRN:  [id number]  PCP:  Jenny Reichmann, MD    Chief Complaint: Employment Physical   History of Present Illness:  John Berry is a 63 y.o. very pleasant male patient who presents with the following:  DOT  Patient Active Problem List   Diagnosis Date Noted  . Coronary artery disease 12/14/2011  . SVT (supraventricular tachycardia) 12/14/2011  . Syncope 12/14/2011  . Hyperlipidemia 12/14/2011  . Personal history of colonic polyps - adenomas 08/13/2010    Past Medical History  Diagnosis Date  . Diverticulosis   . Hypertension   . Hyperlipidemia   . OSA (obstructive sleep apnea)   . Depression   . Trigeminal neuralgia 4/11    Microvascular decompression  . CAD (coronary artery disease)   . Personal history of colonic polyps - adenomas 08/13/2010    TUBULAR ADENOMAS (X4)  . History of heart artery stent     Past Surgical History  Procedure Laterality Date  . Craniotomy  10/25/2009    for trigeminal neuralgia  . Spine surgery      lumbar x 2  . Achilles tendon repair      x 3  . Nasal sinus surgery    . Back surgery    . Colonoscopy  08/13/2010  . Uvla platoplasty      History  Substance Use Topics  . Smoking status: Never Smoker   . Smokeless tobacco: Never Used  . Alcohol Use: No    Family History  Problem Relation Age of Onset  . Hypertension Mother   . Heart disease Mother 93  . Heart disease Brother 74  . Pancreatic cancer Father   . Colon cancer Neg Hx   . Esophageal cancer Neg Hx   . Prostate cancer Neg Hx   . Rectal cancer Neg Hx   . Stomach cancer Neg Hx     No Known Allergies  Medication list has been reviewed and updated.  Current Outpatient Prescriptions on File Prior to Visit  Medication Sig Dispense Refill  . aspirin 81 MG tablet Take 81 mg by mouth daily.    Marland Kitchen atorvastatin  (LIPITOR) 80 MG tablet Take 1 tablet (80 mg total) by mouth daily. 90 tablet 3  . metoprolol tartrate (LOPRESSOR) 25 MG tablet Take 0.5 tablets (12.5 mg total) by mouth 2 (two) times daily. 90 tablet 5  . NITROSTAT 0.4 MG SL tablet DISSOLVE ONE TABLET UNDER THE TONGUE EVERY 5 MINUTES AS NEEDED FOR CHEST PAIN 25 tablet 1   No current facility-administered medications on file prior to visit.    Review of Systems:  As per HPI, otherwise negative.    Physical Examination: Filed Vitals:   05/12/14 0931  BP: 126/80  Pulse: 60  Temp: 98.2 F (36.8 C)  Resp: 17   Filed Vitals:   05/12/14 0931  Height: 5' 10.5" (1.791 m)  Weight: 213 lb (96.616 kg)   Body mass index is 30.12 kg/(m^2). Ideal Body Weight: Weight in (lb) to have BMI = 25: 176.4  GEN: WDWN, NAD, Non-toxic, A & O x 3 HEENT: Atraumatic, Normocephalic. Neck supple. No masses, No LAD. Ears and Nose: No external deformity. CV: RRR, No M/G/R. No JVD. No thrill. No extra heart sounds. PULM: CTA B, no wheezes, crackles, rhonchi. No retractions. No resp. distress. No accessory  muscle use. ABD: S, NT, ND, +BS. No rebound. No HSM. EXTR: No c/c/e NEURO Normal gait.  PSYCH: Normally interactive. Conversant. Not depressed or anxious appearing.  Calm demeanor.    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

## 2014-06-09 ENCOUNTER — Encounter (HOSPITAL_COMMUNITY): Payer: Self-pay | Admitting: Cardiology

## 2014-07-11 ENCOUNTER — Other Ambulatory Visit: Payer: Self-pay | Admitting: Emergency Medicine

## 2014-07-14 ENCOUNTER — Encounter (HOSPITAL_COMMUNITY): Payer: Self-pay | Admitting: Cardiovascular Disease

## 2014-08-16 ENCOUNTER — Other Ambulatory Visit: Payer: Self-pay | Admitting: Emergency Medicine

## 2014-08-24 ENCOUNTER — Telehealth: Payer: Self-pay | Admitting: Cardiology

## 2014-08-24 DIAGNOSIS — I2510 Atherosclerotic heart disease of native coronary artery without angina pectoris: Secondary | ICD-10-CM

## 2014-08-24 DIAGNOSIS — I2584 Coronary atherosclerosis due to calcified coronary lesion: Principal | ICD-10-CM

## 2014-08-24 NOTE — Telephone Encounter (Signed)
Spoke with pt, GXT order placed and sent to admin for scheduling.

## 2014-08-24 NOTE — Telephone Encounter (Signed)
New Message  Pt has to have stress test to renew [employer] license per company MD. Pt requested to speak w/ Rn to see if he needs to have OV before or just sched STresst test. Please call back and discuss.

## 2014-08-24 NOTE — Telephone Encounter (Signed)
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 Please call  Thanks

## 2014-08-24 NOTE — Telephone Encounter (Signed)
Left message for pt to call.

## 2014-08-25 ENCOUNTER — Encounter: Payer: 59 | Admitting: Emergency Medicine

## 2014-08-25 ENCOUNTER — Telehealth: Payer: Self-pay | Admitting: Cardiology

## 2014-08-25 NOTE — Telephone Encounter (Signed)
She needs the diagnosis code for the stress test ordered by Fredia Beets.

## 2014-08-25 NOTE — Telephone Encounter (Signed)
Called Butch Penny back, information had already been provided by our office.

## 2014-08-30 ENCOUNTER — Ambulatory Visit (INDEPENDENT_AMBULATORY_CARE_PROVIDER_SITE_OTHER): Payer: 59 | Admitting: Emergency Medicine

## 2014-08-30 ENCOUNTER — Encounter: Payer: Self-pay | Admitting: Emergency Medicine

## 2014-08-30 VITALS — BP 130/84 | HR 50 | Temp 97.7°F | Resp 16 | Ht 70.5 in | Wt 218.0 lb

## 2014-08-30 DIAGNOSIS — L989 Disorder of the skin and subcutaneous tissue, unspecified: Secondary | ICD-10-CM

## 2014-08-30 MED ORDER — TYPHOID VACCINE PO CPDR: 1.0000 | DELAYED_RELEASE_CAPSULE | ORAL | Status: DC

## 2014-08-30 MED ORDER — DOXYCYCLINE HYCLATE 100 MG PO TABS: ORAL_TABLET | ORAL | Status: DC

## 2014-08-30 NOTE — Progress Notes (Signed)
   Subjective:    Patient ID: John Berry, male    DOB: [birth year], 64 y.o.   MRN: [id number] This chart was scribed for Arlyss Queen, MD by Zola Button, Medical Scribe. This patient was seen in room 22 and the patient's care was started at 10:08 AM.   HPI HPI Comments: John Berry is a 64 y.o. male with a hx of CAD who [facility] for immunizations for a trip to [place]. He will be going to [place] the [place] on July 9th and will be there for a week. He has had the hepatitis A vaccine and the hepatitis B vaccine already. He has had malaria prophylaxis before; he thinks it may have been doxycycline. 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 Patient states he has been doing well otherwise. He has been seeing Dr. Leilani Merl for dermatology, but he states that he needs a referral now to continue seeing him.  Patient states he loves his new job.    Review of Systems     Objective:   Physical Exam CONSTITUTIONAL: Well developed/well nourished HEAD: Normocephalic/atraumatic EYES: EOM/PERRL ENMT: Mucous membranes moist NECK: supple no meningeal signs SPINE: entire spine nontender CV: S1/S2 noted, no murmurs/rubs/gallops noted LUNGS: Lungs are clear to auscultation bilaterally, no apparent distress ABDOMEN: soft, nontender, no rebound or guarding GU: no cva tenderness NEURO: Pt is awake/alert, moves all extremitiesx4 EXTREMITIES: pulses normal, full ROM SKIN: warm, color normal PSYCH: no abnormalities of mood noted        Assessment & Plan:    He is due for an update on his typhoid vaccine. He was given doxycycline for malaria prophylaxis. He has had previous hepatitis A and hepatitis B vaccinations. He is cleared to travel.I personally performed the services described in this documentation, which was scribed in my presence. The recorded information has been reviewed and is accurate.

## 2014-09-13 ENCOUNTER — Telehealth (HOSPITAL_COMMUNITY): Payer: Self-pay

## 2014-09-13 NOTE — Telephone Encounter (Signed)
Encounter complete. 

## 2014-09-15 ENCOUNTER — Ambulatory Visit (HOSPITAL_COMMUNITY)
Admission: RE | Admit: 2014-09-15 | Discharge: 2014-09-15 | Disposition: A | Discharge status: Home / Self Care | Payer: 59 | Source: Ambulatory Visit | Attending: Cardiology | Admitting: Cardiology

## 2014-09-15 DIAGNOSIS — I2584 Coronary atherosclerosis due to calcified coronary lesion: Secondary | ICD-10-CM

## 2014-09-15 DIAGNOSIS — I2510 Atherosclerotic heart disease of native coronary artery without angina pectoris: Secondary | ICD-10-CM | POA: Insufficient documentation

## 2014-09-15 NOTE — Procedures (Signed)
Exercise Treadmill Test  Pre-Exercise Testing Evaluation Rhythm: normal sinus  Rate: 84    Test  Exercise Tolerance Test Ordering MD: Kirk Ruths, MD    Unique Test No: 1  Treadmill:  1  Indication for ETT: CAD, CDL License Physical  Contraindication to ETT: No   Stress Modality: exercise - treadmill  Cardiac Imaging Performed: non   Protocol: standard Bruce - maximal  Max BP:  163/103  Max MPHR (bpm):  159 85% MPR (bpm):  135  MPHR obtained (bpm):  157 % MPHR obtained:  98  Reached 85% MPHR (min:sec):  9:20 Total Exercise Time (min-sec):  12  Workload in METS:  13.4 Borg Scale: 15  Reason ETT Terminated:  fatigue    ST Segment Analysis At Rest: normal ST segments - no evidence of significant ST depression With Exercise: non-specific ST changes, mild up-sloping ST segments  Other Information Arrhythmia:  No  Angina during ETT:  absent (0) Quality of ETT:  diagnostic  ETT Interpretation:  normal - no evidence of ischemia by ST analysis  Comments: Excellent exercise tolerance for age with mild hypertensive response to exercise. Negative for ischemic ECG changes or arrhythmias  Duke TM Score: 12  Recommendations: Defer to Dr. Stanford Breed.   Leonie Man, M.D., M.S. Interventional Cardiologist   Pager # [phone]

## 2014-09-22 ENCOUNTER — Telehealth: Payer: Self-pay

## 2014-09-22 NOTE — Telephone Encounter (Signed)
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  He wants to know if we received his report from Dr. Malachi Pro ([place])  Call back  [phone]

## 2014-09-22 NOTE — Telephone Encounter (Signed)
What report is that?

## 2014-09-22 NOTE — Telephone Encounter (Signed)
Dr. Ouida Sills, you saw him in December. Please advise.

## 2014-09-26 NOTE — Telephone Encounter (Signed)
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 Please fax information to [facility] also. Stress test below.  Exercise tolerance test  Status: EditedResult-FINAL Visible to patient:  Not Released Nextappt: None Dx:  Coronary artery disease due to calcif...       Notes Recorded by Cristopher Estimable, RN on 09/16/2014 at 2:37 PM pt aware of results  Results forwarded to dr Annye Asa per patient request Notes Recorded by Lelon Perla, MD on 09/16/2014 at 7:03 AM [place], MD   09/15/2014 6:53 PM Exercise Treadmill Test  Pre-Exercise Testing Evaluation Rhythm: normal sinus Rate: 84   Test  Exercise Tolerance Test Ordering MD: Kirk Ruths, MD   Unique Test No: 1 Treadmill: 1  Indication for ETT: CAD, CDL License Physical Contraindication  to ETT: No  Stress Modality: exercise - treadmill Cardiac Imaging Performed: non  Protocol: standard Bruce - maximal Max BP: 163/103  Max MPHR (bpm): 159 85% MPR (bpm): 135  MPHR obtained (bpm): 157 % MPHR obtained: 98  Reached 85% MPHR (min:sec): 9:20 Total Exercise Time (min-sec):  12  Workload in METS: 13.4 Borg Scale: 15  Reason ETT Terminated: fatigue   ST Segment Analysis At Rest: normal ST segments - no evidence of significant ST  depression With Exercise: non-specific ST changes, mild up-sloping ST  segments  Other Information Arrhythmia: No  Angina during ETT: absent (0) Quality of ETT: diagnostic  ETT Interpretation: normal - no evidence of ischemia by ST  analysis  Comments: Excellent exercise tolerance for age with mild hypertensive  response to exercise. Negative for ischemic ECG changes or arrhythmias  Duke TM Score: 12  Recommendations: Defer to Dr. Stanford Breed.   Leonie Man, M.D., M.S. Interventional Cardiologist   Pager # [phone]

## 2014-09-27 NOTE — Telephone Encounter (Signed)
Doesn't sound familiar.  Please have patient come in for a new card.  We don't fax things to DOT to best of my knowledge.

## 2014-09-27 NOTE — Telephone Encounter (Signed)
Left message for pt to call back. DOT card in the nurses box.

## 2014-09-29 NOTE — Telephone Encounter (Signed)
Left message for pt to call back  °

## 2014-10-04 ENCOUNTER — Other Ambulatory Visit: Payer: Self-pay | Admitting: Physician Assistant

## 2014-10-13 NOTE — Telephone Encounter (Signed)
Patient called to leave fax number to fax his dot card to [phone]

## 2014-10-18 NOTE — Progress Notes (Signed)
This encounter was created in error - please disregard.

## 2014-10-18 NOTE — Telephone Encounter (Signed)
Dr. Ouida Sills please fill out another DOT card, please see me when available.

## 2014-11-21 ENCOUNTER — Telehealth: Payer: Self-pay | Admitting: Cardiology

## 2014-11-21 NOTE — Telephone Encounter (Signed)
Mrs. Gagen is calling because her John Berry is stating that he is having some heartburn , but is not sure if is heart burn because nothing is relieving it . Please call

## 2014-11-21 NOTE — Progress Notes (Signed)
HPI: FU coronary artery disease. Myoview in June of 2013 showed an ejection fraction of 51%. There was felt to be diaphragmatic attenuation and possible inferior ischemia. The patient underwent catheterization in June of 2013 that showed no disease in the left main. There was a 70% LAD, 80-90% septal, and a small diagonal with a 90% ostial lesion. There was no significant disease in the circumflex. There was an 80% mid right coronary artery and the PDA had a 70% mid lesion. The patient had PCI of the right coronary artery. He has been treated medically for his LAD disease. His ejection fraction is preserved. The patient did have an EP study because of unexplained syncope in June of 2013. There was no inducible ventricular tachycardia or supraventricular tachycardia. Exercise tolerance test in March 2016 negative. Since last seen, in March the patient had pain in the right chest area after falling and hitting his back. Recently he has noticed a burning sensation in his chest after eating certain foods. It is relieved with belching. He denies exertional chest pain, dyspnea on exertion, orthopnea, PND, pedal edema, palpitations or syncope.  Current Outpatient Prescriptions  Medication Sig Dispense Refill  . aspirin 81 MG tablet Take 81 mg by mouth daily.    Marland Kitchen atorvastatin (LIPITOR) 80 MG tablet Take 1 tablet (80 mg total) by mouth daily. 90 tablet 3  . citalopram (CELEXA) 10 MG tablet Take 1 tablet (10 mg total) by mouth daily. NO MORE REFILLS WITHOUT MED REFILL VISIT - 2ND NOTICE 15 tablet 0  . doxycycline (VIBRA-TABS) 100 MG tablet Take 1 one day prior to trip  During  your trip and for one month on return 60 tablet 0  . metoprolol tartrate (LOPRESSOR) 25 MG tablet Take 0.5 tablets (12.5 mg total) by mouth 2 (two) times daily. 90 tablet 5  . NITROSTAT 0.4 MG SL tablet DISSOLVE ONE TABLET UNDER THE TONGUE EVERY 5 MINUTES AS NEEDED FOR CHEST PAIN 25 tablet 1  . typhoid (VIVOTIF) DR capsule Take 1  capsule by mouth every other day. 4 capsule 0   No current facility-administered medications for this visit.     Past Medical History  Diagnosis Date  . Diverticulosis   . Hypertension   . Hyperlipidemia   . OSA (obstructive sleep apnea)   . Depression   . Trigeminal neuralgia 4/11    Microvascular decompression  . CAD (coronary artery disease)   . Personal history of colonic polyps - adenomas 08/13/2010    TUBULAR ADENOMAS (X4)  . History of heart artery stent     Past Surgical History  Procedure Laterality Date  . Craniotomy  10/25/2009    for trigeminal neuralgia  . Spine surgery      lumbar x 2  . Achilles tendon repair      x 3  . Nasal sinus surgery    . Back surgery    . Colonoscopy  08/13/2010  . Uvla platoplasty    . Left heart catheterization with coronary angiogram N/A 12/13/2011    Procedure: LEFT HEART CATHETERIZATION WITH CORONARY ANGIOGRAM;  Surgeon: Hillary Bow, MD;  Location: [facility] C[facility] LAB;  Service: Cardiovascular;  Laterality: N/A;  . Electrophysiology study N/A 12/16/2011    Procedure: ELECTROPHYSIOLOGY STUDY;  Surgeon: Evans Lance, MD;  Location[facility] CATH LAB;  Service: Cardiovascular;  Laterality: N/A;  . Left heart catheterization with coronary angiogram N/A 12/16/2011    Procedure: LEFT HEART CATHETERIZATION WITH CORONARY ANGIOGRAM;  Surgeon: Arnette Norris  Deboraha Sprang, MD;  Location: [place] CATH LAB;  Service: Cardiovascular;  Laterality: N/A;    History   Social History  . Marital Status: Married    Spouse Name: Jenny Reichmann  . Number of Children: 3  . Years of Education: N/A   Occupational History  . 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 Smoking status: Never Smoker   . Smokeless tobacco: Never Used  . Alcohol Use: No  . Drug Use: No  . Sexual Activity: Yes   Other Topics Concern  . Not on file   Social History Narrative   Lives in [place], [place] with wife.     ROS: no fevers or chills, productive cough, hemoptysis, dysphasia,  odynophagia, melena, hematochezia, dysuria, hematuria, rash, seizure activity, orthopnea, PND, pedal edema, claudication. Remaining systems are negative.  Physical Exam: Well-developed well-nourished in no acute distress.  Skin is warm and dry.  HEENT is normal.  Neck is supple.  Chest is clear to auscultation with normal expansion.  Cardiovascular exam is regular rate and rhythm.  Abdominal exam nontender or distended. No masses palpated. Extremities show no edema. neuro grossly intact     Electrocardiogram shows sinus rhythm with no ST changes.

## 2014-11-21 NOTE — Telephone Encounter (Signed)
Patient's wife had called and left message. Pt had been having heartburn for last few days. Apparently no other symptoms w/ this. Notes pt has appt w/ Dr. Stanford Breed tomorrow at 3:30pm.  I attempted to call back, got VM. I left extensive message, requesting that pt keep appt but to defer to ED for unusual/worsening/new symptoms, i.e., dyspnea, faintness, chest pain, etc.  Also instructed that they can return my phone call to my desk during office hours.

## 2014-11-21 NOTE — Telephone Encounter (Signed)
Called patient back at listed number - goes to VM. Wife's phone also goes straight to VM.  Left message on patient's number instructing to return our call.

## 2014-11-22 ENCOUNTER — Ambulatory Visit (INDEPENDENT_AMBULATORY_CARE_PROVIDER_SITE_OTHER): Payer: 59 | Admitting: Cardiology

## 2014-11-22 ENCOUNTER — Encounter: Payer: Self-pay | Admitting: Cardiology

## 2014-11-22 VITALS — BP 108/62 | HR 60 | Ht 70.5 in | Wt 220.2 lb

## 2014-11-22 DIAGNOSIS — I2510 Atherosclerotic heart disease of native coronary artery without angina pectoris: Secondary | ICD-10-CM

## 2014-11-22 DIAGNOSIS — E785 Hyperlipidemia, unspecified: Secondary | ICD-10-CM

## 2014-11-22 DIAGNOSIS — R072 Precordial pain: Secondary | ICD-10-CM | POA: Diagnosis not present

## 2014-11-22 DIAGNOSIS — I2583 Coronary atherosclerosis due to lipid rich plaque: Principal | ICD-10-CM

## 2014-11-22 NOTE — Assessment & Plan Note (Signed)
Patient has had recent chest pain that sounds likely GI related. It occurs after eating certain foods and is relieved with belching. Electrocardiogram shows no ST changes. Recent treadmill negative. I have asked him to try Pepcid or Zantac over-the-counter. I will not pursue further ischemia evaluation at this point.

## 2014-11-22 NOTE — Telephone Encounter (Signed)
Dr. Stanford Breed addressed this issue in office visit today.

## 2014-11-22 NOTE — Assessment & Plan Note (Signed)
Continue statin. 

## 2014-11-22 NOTE — Assessment & Plan Note (Signed)
Continue aspirin and statin. 

## 2014-11-22 NOTE — Patient Instructions (Signed)
Take Zantac or Pepcid over the counter as need for GI problems.  Your physician recommends that you schedule a follow-up appointment in: 6 months with Dr. Stanford Breed.

## 2014-11-24 ENCOUNTER — Telehealth: Payer: Self-pay

## 2014-11-24 DIAGNOSIS — F329 Major depressive disorder, single episode, unspecified: Secondary | ICD-10-CM

## 2014-11-24 DIAGNOSIS — F32A Depression, unspecified: Secondary | ICD-10-CM

## 2014-11-24 MED ORDER — CITALOPRAM HYDROBROMIDE 10 MG PO TABS: 10.0000 mg | ORAL_TABLET | Freq: Every day | ORAL | Status: DC

## 2014-11-24 NOTE — Telephone Encounter (Signed)
Left detailed message on wife's machine of message below.

## 2014-11-24 NOTE — Telephone Encounter (Signed)
Pt would like to know why he cant have his citalopram (CELEXA) 10 MG tablet [[id number]] without on OV, he states this was discussed in his last DOS on 08/30/14

## 2014-11-24 NOTE — Telephone Encounter (Signed)
----   Message -----    From: Thurmond Butts    Sent: 11/24/2014   8:41 AM      To: [facility] Clinical Message Pool Subject: Non-Urgent Medical Question                    Dear Dr. Everlene Farrier,  Carnie has been out of his anti-depressant for about 6 weeks now. He is a Architectural technologist!!! No a rattlesnake ready to strike!  He was in to see you about a month ago and doesn't see the need to come back and spend another $20 co-pay. Plus he says that you are going to help him wean off of it this summer. He has asked me to see if you will refill it for a few more months. (I am begging you myself to please refill it.)  And he does not know his info to log in to his my chart account.  Koichi Platte [birth year]  [facility] [address]  Thank you,  Nikolus Marczak [phone]

## 2014-11-24 NOTE — Telephone Encounter (Signed)
The reason is because he has not had an office visit for depression in over a year with Dr. Everlene Farrier. He has had several notices over the past few months that he needed an OV for further refills. I will refill for 15 pills but this is his final notice. He needs to come in to see Dr. Everlene Farrier for follow up of his depression and more refills.

## 2014-11-24 NOTE — Telephone Encounter (Signed)
Dr. Everlene Farrier do you recall? He is out of town, can someone advise?

## 2014-11-25 NOTE — Addendum Note (Signed)
Addended by: Janett Labella A on: 11/25/2014 07:09 AM   Modules accepted: Orders

## 2014-12-17 ENCOUNTER — Ambulatory Visit (INDEPENDENT_AMBULATORY_CARE_PROVIDER_SITE_OTHER): Payer: 59 | Admitting: Family Medicine

## 2014-12-17 VITALS — BP 118/80 | HR 55 | Temp 98.0°F | Resp 18 | Ht 71.6 in | Wt 216.4 lb

## 2014-12-17 DIAGNOSIS — F4329 Adjustment disorder with other symptoms: Secondary | ICD-10-CM | POA: Diagnosis not present

## 2014-12-17 DIAGNOSIS — F32A Depression, unspecified: Secondary | ICD-10-CM

## 2014-12-17 DIAGNOSIS — F329 Major depressive disorder, single episode, unspecified: Secondary | ICD-10-CM | POA: Diagnosis not present

## 2014-12-17 MED ORDER — CITALOPRAM HYDROBROMIDE 10 MG PO TABS
10.0000 mg | ORAL_TABLET | Freq: Every day | ORAL | Status: DC
Start: 2014-12-17 — End: 2014-12-21

## 2014-12-17 MED ORDER — CLONAZEPAM 1 MG PO TABS: ORAL_TABLET | ORAL | Status: DC

## 2014-12-17 NOTE — Progress Notes (Signed)
Chief Complaint:  Chief Complaint  Patient presents with  . Medication Refill    citalopram    HPI: John Berry is a 64 y.o. male who is here for celexa refills. He states he is doing well. He has been out of meds for 4 weeks.  He tried to get refills but was obligated to come in for a recheck. He thinks he does not need the medicine and wanted to be off of it but had discussed this with Dr Everlene Farrier and since he was going to a youth camp and out of the country for a mission trip it was mutually agreed that it would ot be a good idea. He has had no withdrawal  SE sxs from stopping meds without taper. Per his wife he is more irritable.  No SI/HI. He is a Artist.    Past Medical History  Diagnosis Date  . Diverticulosis   . Hypertension   . Hyperlipidemia   . OSA (obstructive sleep apnea)   . Depression   . Trigeminal neuralgia 4/11    Microvascular decompression  . CAD (coronary artery disease)   . Personal history of colonic polyps - adenomas 08/13/2010    TUBULAR ADENOMAS (X4)  . History of heart artery stent    Past Surgical History  Procedure Laterality Date  . Craniotomy  10/25/2009    for trigeminal neuralgia  . Spine surgery      lumbar x 2  . Achilles tendon repair      x 3  . Nasal sinus surgery    . Back surgery    . Colonoscopy  08/13/2010  . Uvla platoplasty    . Left heart catheterization with coronary angiogram N/A 12/13/2011    Procedure: LEFT HEART CATHETERIZATION WITH CORONARY ANGIOGRAM;  Surgeon: Hillary Bow, MD;  Location: [facility] CATH LAB;  Service: Cardiovascular;  Laterality: N/A;  . Electrophysiology study N/A 12/16/2011    Procedure: ELECTROPHYSIOLOGY STUDY;  Surgeon: Evans Lance, MD;  Location: [facility] CATH LAB;  Service: Cardiovascular;  Laterality: N/A;  . Left heart catheterization with coronary angiogram N/A 12/16/2011    Procedure: LEFT HEART CATHETERIZATION WITH CORONARY ANGIOGRAM;  Surgeon: Thayer Headings, MD;  Location: [facility] CATH  LAB;  Service: Cardiovascular;  Laterality: N/A;   History   Social History  . Marital Status: Married    Spouse Name: Jenny Reichmann  . Number of Children: 3  . Years of Education: N/A   Occupational History  . 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 Smoking status: Never Smoker   . Smokeless tobacco: Never Used  . Alcohol Use: No  . Drug Use: No  . Sexual Activity: Yes   Other Topics Concern  . None   Social History Narrative   Lives in [place], [place] with wife.    Family History  Problem Relation Age of Onset  . Hypertension Mother   . Heart disease Mother 47  . Heart disease Brother 64  . Pancreatic cancer Father   . Colon cancer Neg Hx   . Esophageal cancer Neg Hx   . Prostate cancer Neg Hx   . Rectal cancer Neg Hx   . Stomach cancer Neg Hx    No Known Allergies Prior to Admission medications   Medication Sig Start Date End Date Taking? Authorizing Provider  aspirin 81 MG tablet Take 81 mg by mouth daily.   Yes Historical Provider, MD  atorvastatin (LIPITOR) 80 MG tablet  Take 1 tablet (80 mg total) by mouth daily. 03/11/14  Yes Lelon Perla, MD  citalopram (CELEXA) 10 MG tablet Take 1 tablet (10 mg total) by mouth daily. NO MORE REFILLS WITHOUT MED REFILL VISIT - 3RD AND FINAL NOTICE. 11/24/14  Yes Bennett Scrape V, PA-C  metoprolol tartrate (LOPRESSOR) 25 MG tablet Take 0.5 tablets (12.5 mg total) by mouth 2 (two) times daily. 01/06/14  Yes Lelon Perla, MD  NITROSTAT 0.4 MG SL tablet DISSOLVE ONE TABLET UNDER THE TONGUE EVERY 5 MINUTES AS NEEDED FOR CHEST PAIN 11/04/13  Yes Lelon Perla, MD  typhoid (VIVOTIF) DR capsule Take 1 capsule by mouth every other day. 08/30/14  Yes Darlyne Russian, MD  doxycycline (VIBRA-TABS) 100 MG tablet Take 1 one day prior to trip  During  your trip and for one month on return Patient not taking: Reported on 12/17/2014 08/30/14   Darlyne Russian, MD     ROS: The patient denies fevers, chills, night sweats, unintentional weight loss,  chest pain, palpitations, wheezing, dyspnea on exertion, nausea, vomiting, abdominal pain, dysuria, hematuria, melena, numbness, weakness, or tingling.   All other systems have been reviewed and were otherwise negative with the exception of those mentioned in the HPI and as above.    PHYSICAL EXAM: Filed Vitals:   12/17/14 0853  BP: 118/80  Pulse: 55  Temp: 98 F (36.7 C)  Resp: 18   Filed Vitals:   12/17/14 0853  Height: 5' 11.6" (1.819 m)  Weight: 216 lb 6.4 oz (98.158 kg)   Body mass index is 29.67 kg/(m^2).   General: Alert, no acute distress HEENT:  Normocephalic, atraumatic, oropharynx patent. EOMI, PERRLA Cardiovascular:  Regular rate and rhythm, no rubs murmurs or gallops.  Radial pulse intact. No pedal edema.  Respiratory: Clear to auscultation bilaterally.  No wheezes, rales, or rhonchi.  No cyanosis, no use of accessory musculature GI: No organomegaly, abdomen is soft and non-tender, positive bowel sounds.  No masses. Skin: No rashes. Neurologic: Facial musculature symmetric. Psychiatric: Patient is appropriate throughout our interaction. Lymphatic: No cervical lymphadenopathy Musculoskeletal: Gait intact.   LABS: Results for orders placed or performed in visit on 06/22/13  COMPLETE METABOLIC PANEL WITH GFR  Result Value Ref Range   Sodium 142 135 - 145 mEq/L   Potassium 4.3 3.5 - 5.3 mEq/L   Chloride 109 96 - 112 mEq/L   CO2 25 19 - 32 mEq/L   Glucose, Bld 95 70 - 99 mg/dL   BUN 17 6 - 23 mg/dL   Creat 0.80 0.50 - 1.35 mg/dL   Total Bilirubin 0.6 0.3 - 1.2 mg/dL   Alkaline Phosphatase 36 (L) 39 - 117 U/L   AST 18 0 - 37 U/L   ALT 13 0 - 53 U/L   Total Protein 6.1 6.0 - 8.3 g/dL   Albumin 3.8 3.5 - 5.2 g/dL   Calcium 8.9 8.4 - 10.5 mg/dL   GFR, Est African American >89 mL/min   GFR, Est Non African American >89 mL/min  CBC with Differential  Result Value Ref Range   WBC 4.8 4.0 - 10.5 K/uL   RBC 4.89 4.22 - 5.81 MIL/uL   Hemoglobin 15.0 13.0 - 17.0  g/dL   HCT 42.4 39.0 - 52.0 %   MCV 86.7 78.0 - 100.0 fL   MCH 30.7 26.0 - 34.0 pg   MCHC 35.4 30.0 - 36.0 g/dL   RDW 14.3 11.5 - 15.5 %   Platelets 214 150 -  400 K/uL   Neutrophils Relative % 47 43 - 77 %   Neutro Abs 2.2 1.7 - 7.7 K/uL   Lymphocytes Relative 42 12 - 46 %   Lymphs Abs 2.0 0.7 - 4.0 K/uL   Monocytes Relative 8 3 - 12 %   Monocytes Absolute 0.4 0.1 - 1.0 K/uL   Eosinophils Relative 3 0 - 5 %   Eosinophils Absolute 0.1 0.0 - 0.7 K/uL   Basophils Relative 0 0 - 1 %   Basophils Absolute 0.0 0.0 - 0.1 K/uL   Smear Review Criteria for review not met   TSH  Result Value Ref Range   TSH 1.640 0.350 - 4.500 uIU/mL  Lipid panel  Result Value Ref Range   Cholesterol 131 0 - 200 mg/dL   Triglycerides 78 <150 mg/dL   HDL 46 >39 mg/dL   Total CHOL/HDL Ratio 2.8 Ratio   VLDL 16 0 - 40 mg/dL   LDL Cholesterol 69 0 - 99 mg/dL  IFOBT POC (occult bld, rslt in office)  Result Value Ref Range   IFOBT Negative   POCT urinalysis dipstick  Result Value Ref Range   Color, UA yellow    Clarity, UA clear    Glucose, UA neg    Bilirubin, UA neg    Ketones, UA neg    Spec Grav, UA 1.025    Blood, UA neg    pH, UA 5.5    Protein, UA neg    Urobilinogen, UA 0.2    Nitrite, UA neg    Leukocytes, UA Negative      EKG/XRAY:   Primary read interpreted by Dr. Marin Comment at [facility].   ASSESSMENT/PLAN: Encounter Diagnoses  Name Primary?  . Depression Yes  . Stress and adjustment reaction    64 year old male with a PMH of CAD s/p stent , depression/anxiety , OSA who presents for a refill on his celexa, his depression/anxiety is not well controlled per wife but is normal per the patient since he has been without medds for the last 4 weeks. Will refill  meds for 1 year. Also  Prescribed a short course of klonazepam for increase irritability while celexa returns back to maintenance serum level.  Fu in 6 months. Cardiology is monitoring him for CAD, htn, hyperlipidemia.   Gross sideeffects,  risk and benefits, and alternatives of medications d/w patient. Patient is aware that all medications have potential sideeffects and we are unable to predict every sideeffect or drug-drug interaction that may occur.  LE, [place], DO 12/21/2014 7:10 AM

## 2014-12-21 MED ORDER — CITALOPRAM HYDROBROMIDE 10 MG PO TABS: 10.0000 mg | ORAL_TABLET | Freq: Every day | ORAL | Status: DC

## 2015-02-20 ENCOUNTER — Other Ambulatory Visit: Payer: Self-pay | Admitting: Cardiology

## 2015-03-21 ENCOUNTER — Other Ambulatory Visit: Payer: Self-pay | Admitting: Cardiology

## 2015-04-04 ENCOUNTER — Encounter: Payer: Self-pay | Admitting: Emergency Medicine

## 2015-04-26 ENCOUNTER — Encounter: Payer: Self-pay | Admitting: Internal Medicine

## 2015-05-31 ENCOUNTER — Other Ambulatory Visit: Payer: Self-pay

## 2015-05-31 MED ORDER — ATORVASTATIN CALCIUM 80 MG PO TABS: 80.0000 mg | ORAL_TABLET | Freq: Every day | ORAL | Status: DC

## 2015-06-09 ENCOUNTER — Encounter: Payer: Self-pay | Admitting: Emergency Medicine

## 2015-06-09 ENCOUNTER — Ambulatory Visit (INDEPENDENT_AMBULATORY_CARE_PROVIDER_SITE_OTHER): Payer: 59 | Admitting: Emergency Medicine

## 2015-06-09 VITALS — BP 120/86 | HR 55 | Temp 97.7°F | Resp 16 | Ht 70.5 in | Wt 223.8 lb

## 2015-06-09 DIAGNOSIS — F4329 Adjustment disorder with other symptoms: Secondary | ICD-10-CM

## 2015-06-09 DIAGNOSIS — I519 Heart disease, unspecified: Secondary | ICD-10-CM

## 2015-06-09 DIAGNOSIS — Z1159 Encounter for screening for other viral diseases: Secondary | ICD-10-CM

## 2015-06-09 DIAGNOSIS — Z23 Encounter for immunization: Secondary | ICD-10-CM | POA: Diagnosis not present

## 2015-06-09 DIAGNOSIS — Z125 Encounter for screening for malignant neoplasm of prostate: Secondary | ICD-10-CM | POA: Diagnosis not present

## 2015-06-09 DIAGNOSIS — Z0000 Encounter for general adult medical examination without abnormal findings: Secondary | ICD-10-CM

## 2015-06-09 LAB — POCT URINALYSIS DIP (MANUAL ENTRY)
Bilirubin, UA: NEGATIVE
Blood, UA: NEGATIVE
Glucose, UA: NEGATIVE
Ketones, POC UA: NEGATIVE
Leukocytes, UA: NEGATIVE
Nitrite, UA: NEGATIVE
Protein Ur, POC: NEGATIVE
Spec Grav, UA: 1.02
Urobilinogen, UA: 0.2
pH, UA: 7

## 2015-06-09 LAB — CBC WITH DIFFERENTIAL/PLATELET
Basophils Absolute: 0.1 10*3/uL (ref 0.0–0.1)
Basophils Relative: 1 % (ref 0–1)
Eosinophils Absolute: 0.2 10*3/uL (ref 0.0–0.7)
Eosinophils Relative: 3 % (ref 0–5)
HCT: 45 % (ref 39.0–52.0)
Hemoglobin: 15.3 g/dL (ref 13.0–17.0)
Lymphocytes Relative: 38 % (ref 12–46)
Lymphs Abs: 2 10*3/uL (ref 0.7–4.0)
MCH: 30.8 pg (ref 26.0–34.0)
MCHC: 34 g/dL (ref 30.0–36.0)
MCV: 90.7 fL (ref 78.0–100.0)
MPV: 11.3 fL (ref 8.6–12.4)
Monocytes Absolute: 0.5 10*3/uL (ref 0.1–1.0)
Monocytes Relative: 9 % (ref 3–12)
Neutro Abs: 2.5 10*3/uL (ref 1.7–7.7)
Neutrophils Relative %: 49 % (ref 43–77)
Platelets: 225 10*3/uL (ref 150–400)
RBC: 4.96 MIL/uL (ref 4.22–5.81)
RDW: 14.2 % (ref 11.5–15.5)
WBC: 5.2 10*3/uL (ref 4.0–10.5)

## 2015-06-09 LAB — COMPLETE METABOLIC PANEL WITH GFR
ALT: 20 U/L (ref 9–46)
AST: 19 U/L (ref 10–35)
Albumin: 3.9 g/dL (ref 3.6–5.1)
Alkaline Phosphatase: 38 U/L — ABNORMAL LOW (ref 40–115)
BUN: 16 mg/dL (ref 7–25)
CO2: 28 mmol/L (ref 20–31)
Calcium: 9.1 mg/dL (ref 8.6–10.3)
Chloride: 104 mmol/L (ref 98–110)
Creat: 0.99 mg/dL (ref 0.70–1.25)
GFR, Est African American: >89 mL/min (ref >=60)
GFR, Est Non African American: 81 mL/min (ref >=60)
Glucose, Bld: 87 mg/dL (ref 65–99)
Potassium: 4.4 mmol/L (ref 3.5–5.3)
Sodium: 141 mmol/L (ref 135–146)
Total Bilirubin: 0.8 mg/dL (ref 0.2–1.2)
Total Protein: 6.7 g/dL (ref 6.1–8.1)

## 2015-06-09 LAB — LIPID PANEL
Cholesterol: 128 mg/dL (ref 125–200)
HDL: 49 mg/dL (ref >=40)
LDL Cholesterol: 64 mg/dL (ref <130)
Total CHOL/HDL Ratio: 2.6 Ratio (ref <=5.0)
Triglycerides: 76 mg/dL (ref <150)
VLDL: 15 mg/dL (ref <30)

## 2015-06-09 LAB — TSH: TSH: 1.279 u[IU]/mL (ref 0.350–4.500)

## 2015-06-09 LAB — HEPATITIS C ANTIBODY: HCV Ab: NEGATIVE

## 2015-06-09 NOTE — Progress Notes (Signed)
By signing my name below, I, Raven Small, attest that this documentation has been prepared under the direction and in the presence of Arlyss Queen, MD.  Electronically Signed: Thea Alken, ED Scribe. 06/09/2015. 9:51 AM.  Chief Complaint:  Chief Complaint  Patient presents with  . Annual Exam    HPI: John Berry is a 64 y.o. male with hx of HTN, CAD, HLD, OSA, and depression who reports to [facility] today for a physical.  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 Ouida Sills.   Health Maintenance  He is UTD with colonoscopy, 2015  Surgical hx Back surgery: 2014  Craniotomy for trigeminal neuralgia, 2011 at [facility].  Other physicians Cardiologist: Dr. Stanford Breed. Pt recently had a stress test which he states was normal.  Dermatologist: Dr. Leilani Merl   Depression Depression screen [facility] 2/9 06/09/2015 12/17/2014 08/30/2014 01/20/2012  Decreased Interest 0 0 0 1  Down, Depressed, Hopeless 0 0 0 1  PHQ - 2 Score 0 0 0 2   Pt was seen by Dr. Marin Comment, 12/17/2014 for a medication refill of celexa, after being out of medication for 4 weeks. Pt reports relationship stress with his wife has become more of a stressor. States him and his wife have been helping take care of grandchildren, about 85-90%. Pt states work is good and is doing more what he enjoys doing.  Immunizations Immunization History  Administered Date(s) Administered  . Influenza Split 02/28/2009, 03/24/2010, 03/30/2011  . Influenza,inj,Quad PF,36+ Mos 05/12/2014  . Influenza-Unspecified 04/18/2015  . Tdap 07/01/2009, 06/09/2015  . Zoster 07/01/2014  Tdap- pt is unsure of last Tdap. He told CMA he possibly received it in 2011, which she documented above, but will receive it today.  Shingles- pt has had shingles vaccine   Past Medical History  Diagnosis Date  . Diverticulosis   . Hypertension   . Hyperlipidemia   . OSA (obstructive sleep apnea)   . Depression   . Trigeminal neuralgia 4/11    Microvascular decompression  . CAD  (coronary artery disease)   . Personal history of colonic polyps - adenomas 08/13/2010    TUBULAR ADENOMAS (X4)  . History of heart artery stent    Past Surgical History  Procedure Laterality Date  . Craniotomy  10/25/2009    for trigeminal neuralgia  . Spine surgery      lumbar x 2  . Achilles tendon repair      x 3  . Nasal sinus surgery    . Back surgery    . Colonoscopy  08/13/2010  . Uvla platoplasty    . Left heart catheterization with coronary angiogram N/A 12/13/2011    Procedure: LEFT HEART CATHETERIZATION WITH CORONARY ANGIOGRAM;  Surgeon: Hillary Bow, MD;  Location: [facility] CATH LAB;  Service: Cardiovascular;  Laterality: N/A;  . Electrophysiology study N/A 12/16/2011    Procedure: ELECTROPHYSIOLOGY STUDY;  Surgeon: Evans Lance, MD;  Location: [facility] CATH LAB;  Service: Cardiovascular;  Laterality: N/A;  . Left heart catheterization with coronary angiogram N/A 12/16/2011    Procedure: LEFT HEART CATHETERIZATION WITH CORONARY ANGIOGRAM;  Surgeon: Thayer Headings, MD;  Location: [facility] CATH LAB;  Service: Cardiovascular;  Laterality: N/A;   Social History   Social History  . Marital Status: Married    Spouse Name: John Berry  . Number of Children: 3  . Years of Education: N/A   Occupational History  . 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 Smoking status: Never Smoker   .  Smokeless tobacco: Never Used  . Alcohol Use: No  . Drug Use: No  . Sexual Activity: Yes   Other Topics Concern  . Not on file   Social History Narrative   Lives in [place], [place] with wife.    Family History  Problem Relation Age of Onset  . Hypertension Mother   . Heart disease Mother 53  . Heart disease Brother 70  . Pancreatic cancer Father   . Colon cancer Neg Hx   . Esophageal cancer Neg Hx   . Prostate cancer Neg Hx   . Rectal cancer Neg Hx   . Stomach cancer Neg Hx    No Known Allergies Prior to Admission medications   Medication Sig Start Date End Date Taking? Authorizing  Provider  aspirin 81 MG tablet Take 81 mg by mouth daily.    Historical Provider, MD  atorvastatin (LIPITOR) 80 MG tablet Take 1 tablet (80 mg total) by mouth daily. 05/31/15   Lelon Perla, MD  citalopram (CELEXA) 10 MG tablet Take 1 tablet (10 mg total) by mouth daily. Patient has enough for 3 mons, these are refills to keep on file 12/21/14   Thao P Le, DO  clonazePAM (KLONOPIN) 1 MG tablet Take 1/2-1 tab po BID prn . May cause drowsiness 12/17/14   Thao P Le, DO  doxycycline (VIBRA-TABS) 100 MG tablet Take 1 one day prior to trip  During  your trip and for one month on return Patient not taking: Reported on 12/17/2014 08/30/14   Darlyne Russian, MD  metoprolol tartrate (LOPRESSOR) 25 MG tablet TAKE ONE-HALF TABLET BY MOUTH TWICE DAILY 02/20/15   Lelon Perla, MD  NITROSTAT 0.4 MG SL tablet DISSOLVE ONE TABLET UNDER THE TONGUE EVERY 5 MINUTES AS NEEDED FOR CHEST PAIN 11/04/13   Lelon Perla, MD  typhoid (VIVOTIF) DR capsule Take 1 capsule by mouth every other day. 08/30/14   Darlyne Russian, MD     ROS: The patient denies fevers, chills, night sweats, unintentional weight loss, chest pain, palpitations, wheezing, dyspnea on exertion, nausea, vomiting, abdominal pain, dysuria, hematuria, melena, weakness, or tingling. 13 point ROS reviewed. Positive back pain and numbness.   All other systems have been reviewed and were otherwise negative with the exception of those mentioned in the HPI and as above.    PHYSICAL EXAM: Filed Vitals:   06/09/15 0932 06/09/15 0944  BP: 130/90 120/86  Pulse: 55   Temp: 97.7 F (36.5 C)   Resp: 16    Body mass index is 31.65 kg/(m^2).  Wt Readings from Last 3 Encounters:  06/09/15 223 lb 12.8 oz (101.515 kg)  12/17/14 216 lb 6.4 oz (98.158 kg)  11/22/14 220 lb 3.2 oz (99.882 kg)    Visual Acuity Screening   Right eye Left eye Both eyes  Without correction: 20/20 20/40 20/20   With correction:      General: Alert, no acute distress HEENT:   Normocephalic, atraumatic, oropharynx patent. Eye: Juliette Mangle [facility] Cardiovascular:  Regular rate and rhythm, no rubs murmurs or gallops.  No Carotid bruits, radial pulse intact. No pedal edema.  Respiratory: Clear to auscultation bilaterally.  No wheezes, rales, or rhonchi.  No cyanosis, no use of accessory musculature Abdominal: No organomegaly, abdomen is soft and non-tender, positive bowel sounds.  No masses. Musculoskeletal: Gait intact. No edema, tenderness Skin: No rashes. Neurologic: Facial musculature symmetric. Psychiatric: Patient acts appropriately throughout our interaction. Lymphatic: No cervical or submandibular lymphadenopathy GU:No hernias, prostate normal without nodules.  LABS: Results for orders placed or performed in visit on 06/09/15  POCT urinalysis dipstick  Result Value Ref Range   Color, UA yellow yellow   Clarity, UA clear clear   Glucose, UA negative negative   Bilirubin, UA negative negative   Ketones, POC UA negative negative   Spec Grav, UA 1.020    Blood, UA negative negative   pH, UA 7.0    Protein Ur, POC negative negative   Urobilinogen, UA 0.2    Nitrite, UA Negative Negative   Leukocytes, UA Negative Negative   EKG/XRAY:   Primary read interpreted by Dr. Everlene Farrier at [facility].   ASSESSMENT/PLAN: 1. Annual physical exam  - CBC with Differential/Platelet - COMPLETE METABOLIC PANEL WITH GFR - Hepatitis C antibody - Lipid panel - TSH - PSA - POCT urinalysis dipstick - POC Hemoccult Bld/Stl (3-Cd Home Screen); Future  2. Stress and adjustment reaction Patient doing well. He has settled into his new job. He has stress related to caring for 3 grandchildren but overall is doing well.  3. Heart disease, unspecified He will continue regular follow-ups with his cardiologist. - Lipid panel   4. Screening for prostate cancer PSA is prostate exam was normal.  5. Need for hepatitis C screening test  - Hepatitis C antibody  6. Need for Tdap  vaccination  - Tdap vaccine greater than or equal to 7yo IM 7. LBP Patient has been bothered with intermittent low back pain with radiation down the right leg. He has had previous back surgery. He will let me know if this is worsening or persistent pain we will proceed with an MRI. I personally performed the services described in this documentation, which was scribed in my presence. The recorded information has been reviewed and is accurate.I personally performed the services described in this documentation, which was scribed in my presence. The recorded information has been reviewed and is accurate.  Arlyss Queen, MD  [facility]  06/09/2015 11:58 AM   Gross sideeffects, risk and benefits, and alternatives of medications d/w patient. Patient is aware that all medications have potential sideeffects and we are unable to predict every sideeffect or drug-drug interaction that may occur.  Arlyss Queen MD 06/09/2015 9:22 AM

## 2015-06-09 NOTE — Progress Notes (Deleted)
   Subjective:    Patient ID: John Berry, male    DOB: [birth year], 64 y.o.   MRN: [id number]  HPI    Review of Systems  Constitutional: Negative.   HENT: Negative.   Eyes: Negative.   Respiratory: Negative.   Cardiovascular: Negative.   Gastrointestinal: Negative.   Endocrine: Negative.   Genitourinary: Negative.   Musculoskeletal: Positive for back pain.  Skin: Negative.   Allergic/Immunologic: Negative.   Neurological: Positive for numbness.  Hematological: Negative.   Psychiatric/Behavioral: Negative.        Objective:   Physical Exam        Assessment & Plan:

## 2015-06-10 LAB — PSA: PSA: 0.69 ng/mL (ref <=4.00)

## 2015-06-16 LAB — POC HEMOCCULT BLD/STL (HOME/3-CARD/SCREEN)
Card #2 Fecal Occult Blod, POC: NEGATIVE
Card #3 Fecal Occult Blood, POC: NEGATIVE
Fecal Occult Blood, POC: NEGATIVE

## 2015-06-16 NOTE — Addendum Note (Signed)
Addended by: Elie Confer on: 06/16/2015 09:12 AM   Modules accepted: Orders

## 2015-07-06 ENCOUNTER — Telehealth: Payer: Self-pay

## 2015-07-06 MED ORDER — ATORVASTATIN CALCIUM 80 MG PO TABS: 80.0000 mg | ORAL_TABLET | Freq: Every day | ORAL | Status: DC

## 2015-07-06 MED ORDER — METOPROLOL TARTRATE 25 MG PO TABS: 12.5000 mg | ORAL_TABLET | Freq: Two times a day (BID) | ORAL | Status: DC

## 2015-07-06 NOTE — Telephone Encounter (Signed)
Done

## 2015-07-06 NOTE — Telephone Encounter (Signed)
Patient's wife is calling to get a medication refill for atorvastatin and metoprolol. Pharmacy is now [facility] [address] in [place].

## 2015-07-10 ENCOUNTER — Other Ambulatory Visit: Payer: Self-pay

## 2015-07-10 MED ORDER — ATORVASTATIN CALCIUM 80 MG PO TABS: 80.0000 mg | ORAL_TABLET | Freq: Every day | ORAL | Status: DC

## 2015-08-30 ENCOUNTER — Ambulatory Visit (INDEPENDENT_AMBULATORY_CARE_PROVIDER_SITE_OTHER): Payer: Self-pay | Admitting: Physician Assistant

## 2015-08-30 VITALS — BP 122/84 | HR 63 | Temp 97.9°F | Resp 14 | Ht 70.25 in | Wt 223.0 lb

## 2015-08-30 DIAGNOSIS — Z0289 Encounter for other administrative examinations: Secondary | ICD-10-CM

## 2015-08-30 DIAGNOSIS — Z021 Encounter for pre-employment examination: Secondary | ICD-10-CM

## 2015-08-30 NOTE — Progress Notes (Signed)
Patient ID: John Berry, male   DOB: 1950-08-30, 65 y.o.   MRN: [id number]  Commercial Driver Medical Examination   ALMALIK TRITTO is a 65 y.o. male who presents today for a commercial driver fitness determination physical exam. The patient reports a history of CAD with preserved ejection fraction of 51% last measured by [facility] in 2013.  Most recently had a stress test in March of 2016 which showed excellent response to exercise.     The following portions of the patient's history were reviewed and updated as appropriate: allergies, current medications, past family history, past medical history, past social history, past surgical history and problem list. Review of Systems Pertinent items are noted in HPI.   Objective:    Vision/hearing:  Visual Acuity Screening   Right eye Left eye Both eyes  Without correction: 20/15 20/15 -1 20/15 -1  With correction:     Comments: Titmus: left eye 70% right eye 80% Colors: 6/6  Hearing Screening Comments: Whisper test: 9 ft  Applicant can recognize and distinguish among traffic control signals and devices showing standard red, green, and amber colors.  Corrective lenses required: No  Monocular Vision?: No  Hearing aid requirement: No  Physical Exam  Constitutional: He is oriented to person, place, and time. He appears well-developed and well-nourished.  HENT:  Head: Normocephalic and atraumatic.  Right Ear: Hearing, tympanic membrane, external ear and ear canal normal.  Left Ear: Hearing, tympanic membrane, external ear and ear canal normal.  Nose: Nose normal.  Mouth/Throat: Uvula is midline, oropharynx is clear and moist and mucous membranes are normal.  Eyes: Conjunctivae, EOM and lids are normal. Pupils are equal, round, and reactive to light. Right eye exhibits no discharge. Left eye exhibits no discharge. No scleral icterus.  Neck: Trachea normal and normal range of motion. Neck supple. Carotid bruit is not present.   Cardiovascular: Normal rate, regular rhythm, normal heart sounds, intact distal pulses and normal pulses.   No murmur heard. Pulmonary/Chest: Effort normal and breath sounds normal. No respiratory distress. He has no wheezes. He has no rhonchi. He has no rales.  Abdominal: Soft. Normal appearance and bowel sounds are normal. He exhibits no abdominal bruit. There is no tenderness.  Musculoskeletal: Normal range of motion. He exhibits no edema or tenderness.  Lymphadenopathy:       Head (right side): No submental, no submandibular, no tonsillar, no preauricular, no posterior auricular and no occipital adenopathy present.       Head (left side): No submental, no submandibular, no tonsillar, no preauricular, no posterior auricular and no occipital adenopathy present.    He has no cervical adenopathy.  Neurological: He is alert and oriented to person, place, and time. He has normal strength and normal reflexes. No cranial nerve deficit or sensory deficit. Coordination and gait normal.  Skin: Skin is warm, dry and intact. No lesion and no rash noted.  Psychiatric: He has a normal mood and affect. His speech is normal and behavior is normal. Judgment and thought content normal.    BP 122/84 mmHg  Pulse 63  Temp(Src) 97.9 F (36.6 C) (Oral)  Resp 14  Ht 5' 10.25" (1.784 m)  Wt 223 lb (101.152 kg)  BMI 31.78 kg/m2  SpO2 99%  Labs: Comments: Urinalysis: Spg: 1.025, Blood: NEG. Glucose: NEG, Protein: NEG  Assessment:    Healthy male exam. Meets standards, but periodic monitoring required due to documented history of CAD. Will write for a 12 month card and and he  will need a repeat stress in March of 2018.    Plan:    Medical examiners certificate completed and printed.  Return in 1 year.

## 2015-11-27 ENCOUNTER — Other Ambulatory Visit: Payer: Self-pay | Admitting: Family Medicine

## 2015-11-30 ENCOUNTER — Other Ambulatory Visit: Payer: Self-pay | Admitting: Emergency Medicine

## 2016-01-01 ENCOUNTER — Telehealth: Payer: Self-pay

## 2016-01-01 NOTE — Telephone Encounter (Signed)
The patient is leaving Saturday for [place] and needs Malaria pills.  He is a patient of Dr Everlene Farrier.  He was given this medication last year for the same purpose.  CB# for wife, Caren Griffins: [phone]

## 2016-01-02 ENCOUNTER — Other Ambulatory Visit: Payer: Self-pay | Admitting: Emergency Medicine

## 2016-01-02 DIAGNOSIS — Z7184 Encounter for health counseling related to travel: Secondary | ICD-10-CM

## 2016-01-02 MED ORDER — DOXYCYCLINE HYCLATE 100 MG PO TABS: ORAL_TABLET | ORAL | Status: DC

## 2016-01-02 NOTE — Telephone Encounter (Signed)
Prescription has been sent for doxycycline

## 2016-01-03 ENCOUNTER — Other Ambulatory Visit: Payer: Self-pay | Admitting: Emergency Medicine

## 2016-01-03 ENCOUNTER — Telehealth: Payer: Self-pay | Admitting: Emergency Medicine

## 2016-01-03 MED ORDER — CITALOPRAM HYDROBROMIDE 10 MG PO TABS: 10.0000 mg | ORAL_TABLET | Freq: Every day | ORAL | Status: DC

## 2016-01-03 NOTE — Telephone Encounter (Signed)
Left message letting him know Rx at the pharmacy.

## 2016-01-03 NOTE — Telephone Encounter (Signed)
Patient states he is going out of the country on Saturday. He need Dr. Everlene Farrier to prescribe him Malaria pills before he leaves. [phone] or [phone] Jenny Reichmann) Wife.

## 2016-01-03 NOTE — Telephone Encounter (Signed)
Rs sent.

## 2016-01-11 ENCOUNTER — Other Ambulatory Visit: Payer: Self-pay | Admitting: Emergency Medicine

## 2016-01-30 ENCOUNTER — Other Ambulatory Visit: Payer: Self-pay

## 2016-01-30 MED ORDER — METOPROLOL TARTRATE 25 MG PO TABS: 12.5000 mg | ORAL_TABLET | Freq: Two times a day (BID) | ORAL | 0 refills | Status: DC

## 2016-02-03 ENCOUNTER — Ambulatory Visit (INDEPENDENT_AMBULATORY_CARE_PROVIDER_SITE_OTHER): Payer: BLUE CROSS/BLUE SHIELD | Admitting: Urgent Care

## 2016-02-03 VITALS — BP 130/72 | HR 52 | Temp 97.8°F | Resp 12 | Ht 71.0 in | Wt 214.0 lb

## 2016-02-03 DIAGNOSIS — F329 Major depressive disorder, single episode, unspecified: Secondary | ICD-10-CM | POA: Diagnosis not present

## 2016-02-03 DIAGNOSIS — F32A Depression, unspecified: Secondary | ICD-10-CM

## 2016-02-03 MED ORDER — CITALOPRAM HYDROBROMIDE 10 MG PO TABS: 10.0000 mg | ORAL_TABLET | Freq: Every day | ORAL | 5 refills | Status: DC

## 2016-02-03 NOTE — Patient Instructions (Addendum)
Citalopram tablets What is this medicine? CITALOPRAM (sye TAL oh pram) is a medicine for depression. This medicine may be used for other purposes; ask your health care provider or pharmacist if you have questions. What should I tell my health care provider before I take this medicine? They need to know if you have any of these conditions: -bipolar disorder or a family history of bipolar disorder -diabetes -glaucoma -heart disease -history of irregular heartbeat -kidney or liver disease -low levels of magnesium or potassium in the blood -receiving electroconvulsive therapy -seizures (convulsions) -suicidal thoughts or a previous suicide attempt -an unusual or allergic reaction to citalopram, escitalopram, other medicines, foods, dyes, or preservatives -pregnant or trying to become pregnant -breast-feeding How should I use this medicine? Take this medicine by mouth with a glass of water. Follow the directions on the prescription label. You can take it with or without food. Take your medicine at regular intervals. Do not take your medicine more often than directed. Do not stop taking this medicine suddenly except upon the advice of your doctor. Stopping this medicine too quickly may cause serious side effects or your condition may worsen. A special MedGuide will be given to you by the pharmacist with each prescription and refill. Be sure to read this information carefully each time. Talk to your pediatrician regarding the use of this medicine in children. Special care may be needed. Patients over 63 years old may have a stronger reaction and need a smaller dose. Overdosage: If you think you have taken too much of this medicine contact a poison control center or emergency room at once. NOTE: This medicine is only for you. Do not share this medicine with others. What if I miss a dose? If you miss a dose, take it as soon as you can. If it is almost time for your next dose, take only that dose.  Do not take double or extra doses. What may interact with this medicine? Do not take this medicine with any of the following medications: -certain medicines for fungal infections like fluconazole, itraconazole, ketoconazole, posaconazole, voriconazole -cisapride -dofetilide -dronedarone -escitalopram -linezolid -MAOIs like Carbex, Eldepryl, Marplan, Nardil, and Parnate -methylene blue (injected into a vein) -pimozide -thioridazine -ziprasidone This medicine may also interact with the following medications: -alcohol -aspirin and aspirin-like medicines -carbamazepine -certain medicines for depression, anxiety, or psychotic disturbances -certain medicines for infections like chloroquine, clarithromycin, erythromycin, furazolidone, isoniazid, pentamidine -certain medicines for migraine headaches like almotriptan, eletriptan, frovatriptan, naratriptan, rizatriptan, sumatriptan, zolmitriptan -certain medicines for sleep -certain medicines that treat or prevent blood clots like dalteparin, enoxaparin, warfarin -cimetidine -diuretics -fentanyl -lithium -methadone -metoprolol -NSAIDs, medicines for pain and inflammation, like ibuprofen or naproxen -omeprazole -other medicines that prolong the QT interval (cause an abnormal heart rhythm) -procarbazine -rasagiline -supplements like St. John's wort, kava kava, valerian -tramadol -tryptophan This list may not describe all possible interactions. Give your health care provider a list of all the medicines, herbs, non-prescription drugs, or dietary supplements you use. Also tell them if you smoke, drink alcohol, or use illegal drugs. Some items may interact with your medicine. What should I watch for while using this medicine? Tell your doctor if your symptoms do not get better or if they get worse. Visit your doctor or health care professional for regular checks on your progress. Because it may take several weeks to see the full effects of  this medicine, it is important to continue your treatment as prescribed by your doctor. Patients and their families should watch out   for new or worsening thoughts of suicide or depression. Also watch out for sudden changes in feelings such as feeling anxious, agitated, panicky, irritable, hostile, aggressive, impulsive, severely restless, overly excited and hyperactive, or not being able to sleep. If this happens, especially at the beginning of treatment or after a change in dose, call your health care professional. Dennis Bast may get drowsy or dizzy. Do not drive, use machinery, or do anything that needs mental alertness until you know how this medicine affects you. Do not stand or sit up quickly, especially if you are an older patient. This reduces the risk of dizzy or fainting spells. Alcohol may interfere with the effect of this medicine. Avoid alcoholic drinks. Your mouth may get dry. Chewing sugarless gum or sucking hard candy, and drinking plenty of water will help. Contact your doctor if the problem does not go away or is severe. What side effects may I notice from receiving this medicine? Side effects that you should report to your doctor or health care professional as soon as possible: -allergic reactions like skin rash, itching or hives, swelling of the face, lips, or tongue -chest pain -confusion -dizziness -fast, irregular heartbeat -fast talking and excited feelings or actions that are out of control -feeling faint or lightheaded, falls -hallucination, loss of contact with reality -seizures -shortness of breath -suicidal thoughts or other mood changes -unusual bleeding or bruising Side effects that usually do not require medical attention (report to your doctor or health care professional if they continue or are bothersome): -blurred vision -change in appetite -change in sex drive or performance -headache -increased sweating -nausea -trouble sleeping This list may not describe all  possible side effects. Call your doctor for medical advice about side effects. You may report side effects to FDA at 1-800-FDA-1088. Where should I keep my medicine? Keep out of reach of children. Store at room temperature between 15 and 30 degrees C (59 and 86 degrees F). Throw away any unused medicine after the expiration date. NOTE: This sheet is a summary. It may not cover all possible information. If you have questions about this medicine, talk to your doctor, pharmacist, or health care provider.    2016, Elsevier/Gold Standard. (2013-01-08 13:19:48)     IF you received an x-ray today, you will receive an invoice from [facility]. Please contact [facility] at [phone] with questions or concerns regarding your invoice.   IF you received labwork today, you will receive an invoice from [employer]. Please contact [facility] at [phone] with questions or concerns regarding your invoice.   Our billing staff will not be able to assist you with questions regarding bills from these companies.  You will be contacted with the lab results as soon as they are available. The fastest way to get your results is to activate your My Chart account. Instructions are located on the last page of this paperwork. If you have not heard from [place] regarding the results in 2 weeks, please contact this office.

## 2016-02-03 NOTE — Progress Notes (Signed)
    MRN: [id number] DOB: 1951/04/20  Subjective:   John Berry is a 65 y.o. male presenting for follow up on depression.   Patient has been on citalopram since 2013. Had major life changes including MI, losing his job during that time. He has since had difficulty finding work, has had very short term jobs. Has wife, 3 adult daughters, good support network. Denies SI, HI, decreased appetite, decreased energy, insomnia, slowed speech, slowed movement. 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  John Berry has a current medication list which includes the following prescription(s): alprazolam, aspirin, atorvastatin, citalopram, doxycycline, metoprolol tartrate, and nitrostat. Also has No Known Allergies.  John Berry  has a past medical history of CAD (coronary artery disease); Depression; Diverticulosis; History of heart artery stent; Hyperlipidemia; Hypertension; OSA (obstructive sleep apnea); Personal history of colonic polyps - adenomas (08/13/2010); and Trigeminal neuralgia (4/11). Also  has a past surgical history that includes Craniotomy (10/25/2009); Spine surgery; Achilles tendon repair; Nasal sinus surgery; Back surgery; Colonoscopy (08/13/2010); uvla platoplasty; left heart catheterization with coronary angiogram (N/A, 12/13/2011); electrophysiology study (N/A, 12/16/2011); and left heart catheterization with coronary angiogram (N/A, 12/16/2011).  Objective:   Vitals: BP 130/72   Pulse (!) 52   Temp 97.8 F (36.6 C) (Oral)   Resp 12   Ht 5\' 11"  (1.803 m)   Wt 214 lb (97.1 kg)   SpO2 97%   BMI 29.85 kg/m   Physical Exam  Constitutional: He is oriented to person, place, and time. He appears well-developed and well-nourished.  Cardiovascular: Normal rate.   Pulmonary/Chest: Effort normal.  Neurological: He is alert and oriented to person, place, and time.  Psychiatric:  Judgment and thought content normal. His mood appears not anxious. His affect is not labile. His speech is not rapid and/or pressured and not delayed. He is not agitated and not slowed. He does not exhibit a depressed mood.   Assessment and Plan :   1. Depression - Stable, offered an increase to 20mg  given that his job search is still not steady. He opted to continue with 10mg  and will let me know in the next 1-2 months if he wants an increase.  Jaynee Eagles, PA-C [facility] [phone] 02/03/2016 9:27 AM

## 2016-03-05 ENCOUNTER — Other Ambulatory Visit: Payer: Self-pay | Admitting: Emergency Medicine

## 2016-04-07 ENCOUNTER — Other Ambulatory Visit: Payer: Self-pay | Admitting: Emergency Medicine

## 2016-04-13 ENCOUNTER — Other Ambulatory Visit: Payer: Self-pay | Admitting: Emergency Medicine

## 2016-07-02 ENCOUNTER — Other Ambulatory Visit: Payer: Self-pay | Admitting: Emergency Medicine

## 2016-07-09 ENCOUNTER — Encounter: Payer: Self-pay | Admitting: Cardiology

## 2016-07-09 ENCOUNTER — Ambulatory Visit (INDEPENDENT_AMBULATORY_CARE_PROVIDER_SITE_OTHER): Payer: BLUE CROSS/BLUE SHIELD | Admitting: Cardiology

## 2016-07-09 VITALS — BP 146/88 | HR 59 | Ht 71.0 in | Wt 226.0 lb

## 2016-07-09 DIAGNOSIS — I2510 Atherosclerotic heart disease of native coronary artery without angina pectoris: Secondary | ICD-10-CM | POA: Diagnosis not present

## 2016-07-09 DIAGNOSIS — E7800 Pure hypercholesterolemia, unspecified: Secondary | ICD-10-CM | POA: Diagnosis not present

## 2016-07-09 MED ORDER — METOPROLOL TARTRATE 25 MG PO TABS: 12.5000 mg | ORAL_TABLET | Freq: Two times a day (BID) | ORAL | 11 refills | Status: DC

## 2016-07-09 MED ORDER — ROSUVASTATIN CALCIUM 20 MG PO TABS: 20.0000 mg | ORAL_TABLET | Freq: Every day | ORAL | 11 refills | Status: DC

## 2016-07-09 NOTE — Progress Notes (Signed)
HPI: FU coronary artery disease. Myoview in June of 2013 showed an ejection fraction of 51%. There was felt to be diaphragmatic attenuation and possible inferior ischemia. The patient underwent catheterization in June of 2013 that showed no disease in the left main. There was a 70% LAD, 80-90% septal, and a small diagonal with a 90% ostial lesion. There was no significant disease in the circumflex. There was an 80% mid right coronary artery and the PDA had a 70% mid lesion. The patient had PCI of the right coronary artery. He has been treated medically for his LAD disease. His ejection fraction is preserved. The patient did have an EP study because of unexplained syncope in June of 2013. There was no inducible ventricular tachycardia or supraventricular tachycardia. Exercise tolerance test in March 2016 negative. Since last seen, the patient denies any dyspnea on exertion, orthopnea, PND, pedal edema, palpitations, syncope or chest pain. Has some myalgias with Lipitor and he discontinued this.    Current Outpatient Prescriptions  Medication Sig Dispense Refill  . aspirin 81 MG tablet Take 81 mg by mouth daily.    . citalopram (CELEXA) 10 MG tablet Take 1 tablet (10 mg total) by mouth daily. 30 tablet 5  . metoprolol tartrate (LOPRESSOR) 25 MG tablet TAKE 0.5 TABLETS(12.5MG  TOTAL) BY MOUTH TWO TIMES DAILY 30 tablet 0  . NITROSTAT 0.4 MG SL tablet DISSOLVE ONE TABLET UNDER THE TONGUE EVERY 5 MINUTES AS NEEDED FOR CHEST PAIN 25 tablet 1  . atorvastatin (LIPITOR) 80 MG tablet TAKE 1 TABLET BY MOUTH EVERY DAY (Patient not taking: Reported on 07/09/2016) 90 tablet 0   No current facility-administered medications for this visit.      Past Medical History:  Diagnosis Date  . CAD (coronary artery disease)   . Depression   . Diverticulosis   . History of heart artery stent   . Hyperlipidemia   . Hypertension   . OSA (obstructive sleep apnea)   . Personal history of colonic polyps - adenomas  08/13/2010   TUBULAR ADENOMAS (X4)  . Trigeminal neuralgia 4/11   Microvascular decompression    Past Surgical History:  Procedure Laterality Date  . ACHILLES TENDON REPAIR     x 3  . BACK SURGERY    . COLONOSCOPY  08/13/2010  . CRANIOTOMY  10/25/2009   for trigeminal neuralgia  . ELECTROPHYSIOLOGY STUDY N/A 12/16/2011   Procedure: ELECTROPHYSIOLOGY STUDY;  Surgeon: Evans Lance, MD;  Location: [facility] C[facility] LAB;  Service: Cardiovascular;  Laterality: N/A;  . LEFT HEART CATHETERIZATION WITH CORONARY ANGIOGRAM N/A 12/13/2011   Procedure: LEFT HEART CATHETERIZATION WITH CORONARY ANGIOGRAM;  Surgeon: Hillary Bow, MD;  Location: [facility] CATH LAB;  Service: Cardiovascular;  Laterality: N/A;  . LEFT HEART CATHETERIZATION WITH CORONARY ANGIOGRAM N/A 12/16/2011   Procedure: LEFT HEART CATHETERIZATION WITH CORONARY ANGIOGRAM;  Surgeon: Thayer Headings, MD;  Location: [facility] CATH LAB;  Service: Cardiovascular;  Laterality: N/A;  . NASAL SINUS SURGERY    . SPINE SURGERY     lumbar x 2  . uvla platoplasty      Social History   Social History  . Marital status: Married    Spouse name: Jenny Reichmann  . Number of children: 3  . Years of education: N/A   Occupational History  . [employer]   Social History Main Topics  . Smoking status: Never Smoker  . Smokeless tobacco: Never Used  . Alcohol use No  . Drug use: No  .  Sexual activity: Yes   Other Topics Concern  . Not on file   Social History Narrative   Lives in [place], [place] with wife.    Exercise: No    Family History  Problem Relation Age of Onset  . Hypertension Mother   . Heart disease Mother 27  . Hyperlipidemia Mother   . Pancreatic cancer Father   . Heart disease Brother 8  . Colon cancer Neg Hx   . Esophageal cancer Neg Hx   . Prostate cancer Neg Hx   . Rectal cancer Neg Hx   . Stomach cancer Neg Hx     ROS: no fevers or chills, productive cough, hemoptysis, dysphasia, odynophagia, melena, hematochezia,  dysuria, hematuria, rash, seizure activity, orthopnea, PND, pedal edema, claudication. Remaining systems are negative.  Physical Exam: Well-developed well-nourished in no acute distress.  Skin is warm and dry.  HEENT is normal.  Neck is supple.  Chest is clear to auscultation with normal expansion.  Cardiovascular exam is regular rate and rhythm.  Abdominal exam nontender or distended. No masses palpated. Extremities show no edema. neuro grossly intact  ECG sinus rhythm at a rate of 59. RV conduction delay.  A/P  1 coronary artery disease-continue aspirin. Resume statin.  2 hyperlipidemia-patient had myalgias with Lipitor 80 mg daily. We will try Crestor 20 mg daily. Check lipids and liver in 4 weeks.   Kirk Ruths, MD

## 2016-07-09 NOTE — Patient Instructions (Signed)
Medication Instructions:   START ROSUVASTATIN 20 MG ONCE DAILY  Labwork:  Your physician recommends that you return for lab work in:4 WEEKS= DO NOT EAT PRIOR TO LAB WORK  Follow-Up:  Your physician wants you to follow-up in: John Berry will receive a reminder letter in the mail two months in advance. If you don't receive a letter, please call our office to schedule the follow-up appointment.    If you need a refill on your cardiac medications before your next appointment, please call your pharmacy.

## 2016-07-12 NOTE — Addendum Note (Signed)
Addended by: Cristopher Estimable on: 07/12/2016 07:54 AM   Modules accepted: Orders

## 2016-07-15 ENCOUNTER — Other Ambulatory Visit: Payer: Self-pay | Admitting: Cardiology

## 2016-07-15 NOTE — Telephone Encounter (Signed)
LM for patient that med was Rx'ed to pharmacy. See below  rosuvastatin (CRESTOR) 20 MG tablet 30 tablet 11 07/09/2016 10/07/2016   Sig - Route: Take 1 tablet (20 mg total) by mouth daily. - Oral   E-Prescribing Status: Receipt confirmed by pharmacy (07/09/2016 11:42 AM EST)   Associated Diagnoses   Pure hypercholesterolemia - [place], [place] - [place] [address]

## 2016-07-15 NOTE — Telephone Encounter (Signed)
New Message   *STAT* If patient is at the pharmacy, call can be transferred to refill team.   1. Which medications need to be refilled? (please list name of each medication and dose if known) Crestor 20mg    2. Which pharmacy/location (including street and city if local pharmacy) is medication to be sent to? [facility] [place]   Per pt states the medication was not sent to the pharmacy. Please call back to discuss

## 2016-07-29 ENCOUNTER — Other Ambulatory Visit: Payer: Self-pay | Admitting: Urgent Care

## 2016-07-29 DIAGNOSIS — F329 Major depressive disorder, single episode, unspecified: Secondary | ICD-10-CM

## 2016-07-29 DIAGNOSIS — F32A Depression, unspecified: Secondary | ICD-10-CM

## 2016-07-29 NOTE — Telephone Encounter (Signed)
Meds ordered this encounter  Medications  . citalopram (CELEXA) 10 MG tablet    Sig: TAKE 1 TABLET(10 MG) BY MOUTH DAILY    Dispense:  30 tablet    Refill:  0    Need office visit for additional refills.    Spoke with patient. Advised that refill sent and needs OV with Mr. Bess Harvest for additional refills.

## 2016-08-06 ENCOUNTER — Other Ambulatory Visit: Payer: Self-pay | Admitting: Physician Assistant

## 2016-08-06 DIAGNOSIS — F32A Depression, unspecified: Secondary | ICD-10-CM

## 2016-08-06 DIAGNOSIS — F329 Major depressive disorder, single episode, unspecified: Secondary | ICD-10-CM

## 2016-09-06 ENCOUNTER — Other Ambulatory Visit: Payer: Self-pay | Admitting: Physician Assistant

## 2016-09-06 DIAGNOSIS — F32A Depression, unspecified: Secondary | ICD-10-CM

## 2016-09-06 DIAGNOSIS — F329 Major depressive disorder, single episode, unspecified: Secondary | ICD-10-CM

## 2016-09-09 ENCOUNTER — Other Ambulatory Visit: Payer: Self-pay | Admitting: Physician Assistant

## 2016-09-09 DIAGNOSIS — F32A Depression, unspecified: Secondary | ICD-10-CM

## 2016-09-09 DIAGNOSIS — F329 Major depressive disorder, single episode, unspecified: Secondary | ICD-10-CM

## 2016-09-10 LAB — LIPID PANEL
Cholesterol: 108 mg/dL (ref <200)
HDL: 36 mg/dL — ABNORMAL LOW (ref >40)
LDL Cholesterol: 55 mg/dL (ref <100)
Total CHOL/HDL Ratio: 3 Ratio (ref <5.0)
Triglycerides: 86 mg/dL (ref <150)
VLDL: 17 mg/dL (ref <30)

## 2016-09-10 LAB — HEPATIC FUNCTION PANEL
ALT: 22 U/L (ref 9–46)
AST: 19 U/L (ref 10–35)
Albumin: 3.8 g/dL (ref 3.6–5.1)
Alkaline Phosphatase: 33 U/L — ABNORMAL LOW (ref 40–115)
Bilirubin, Direct: 0.2 mg/dL (ref <=0.2)
Indirect Bilirubin: 0.4 mg/dL (ref 0.2–1.2)
Total Bilirubin: 0.6 mg/dL (ref 0.2–1.2)
Total Protein: 6.4 g/dL (ref 6.1–8.1)

## 2016-09-11 ENCOUNTER — Encounter: Payer: Self-pay | Admitting: *Deleted

## 2016-11-14 ENCOUNTER — Telehealth: Payer: Self-pay

## 2016-11-14 NOTE — Telephone Encounter (Signed)
The 5 year follow up visit for the [place] was completed. There were no adverse events to report and the subject's medications were reviewed.  This is the final follow up for this study.

## 2016-11-21 DIAGNOSIS — M79641 Pain in right hand: Secondary | ICD-10-CM | POA: Insufficient documentation

## 2017-01-02 DIAGNOSIS — G5603 Carpal tunnel syndrome, bilateral upper limbs: Secondary | ICD-10-CM | POA: Insufficient documentation

## 2017-01-16 ENCOUNTER — Encounter: Payer: Self-pay | Admitting: Internal Medicine

## 2017-02-11 ENCOUNTER — Telehealth: Payer: Self-pay | Admitting: Cardiology

## 2017-02-11 ENCOUNTER — Other Ambulatory Visit: Payer: Self-pay

## 2017-02-11 ENCOUNTER — Encounter: Payer: Self-pay | Admitting: Internal Medicine

## 2017-02-11 MED ORDER — NITROGLYCERIN 0.4 MG SL SUBL: 0.4000 mg | SUBLINGUAL_TABLET | SUBLINGUAL | 1 refills | Status: DC | PRN

## 2017-02-11 NOTE — Telephone Encounter (Signed)
New message     *STAT* If patient is at the pharmacy, call can be transferred to refill team.   1. Which medications need to be refilled? (please list name of each medication and dose if known) NITROSTAT 0.4 MG SL tablet  2. Which pharmacy/location (including street and city if local pharmacy) is medication to be sent to? [facility]  3. Do they need a 30 day or 90 day supply? 30 day supply

## 2017-03-24 ENCOUNTER — Ambulatory Visit (AMBULATORY_SURGERY_CENTER): Payer: Self-pay

## 2017-03-24 VITALS — Ht 71.0 in | Wt 229.2 lb

## 2017-03-24 DIAGNOSIS — Z860100 Personal history of colon polyps, unspecified: Secondary | ICD-10-CM

## 2017-03-24 MED ORDER — SUPREP BOWEL PREP KIT 17.5-3.13-1.6 GM/177ML PO SOLN
1.0000 | Freq: Once | ORAL | 0 refills | Status: AC
Start: 2017-03-24 — End: 2017-03-24

## 2017-03-24 NOTE — Progress Notes (Signed)
No allergies to eggs or soy No diet meds No home oxygen No past problems with anesthesia  Declined emmi 

## 2017-03-28 ENCOUNTER — Encounter: Payer: Self-pay | Admitting: Internal Medicine

## 2017-04-10 ENCOUNTER — Encounter: Payer: Self-pay | Admitting: Internal Medicine

## 2017-04-10 ENCOUNTER — Ambulatory Visit (AMBULATORY_SURGERY_CENTER): Payer: BLUE CROSS/BLUE SHIELD | Admitting: Internal Medicine

## 2017-04-10 VITALS — BP 120/75 | HR 50 | Temp 97.7°F | Resp 16 | Ht 71.0 in | Wt 229.0 lb

## 2017-04-10 DIAGNOSIS — D125 Benign neoplasm of sigmoid colon: Secondary | ICD-10-CM

## 2017-04-10 DIAGNOSIS — Z86010 Personal history of colonic polyps: Secondary | ICD-10-CM

## 2017-04-10 DIAGNOSIS — D122 Benign neoplasm of ascending colon: Secondary | ICD-10-CM | POA: Diagnosis not present

## 2017-04-10 DIAGNOSIS — D123 Benign neoplasm of transverse colon: Secondary | ICD-10-CM | POA: Diagnosis not present

## 2017-04-10 MED ORDER — SODIUM CHLORIDE 0.9 % IV SOLN: 500.0000 mL | INTRAVENOUS | Status: DC

## 2017-04-10 NOTE — Progress Notes (Signed)
Called to room to assist during endoscopic procedure.  Patient ID and intended procedure confirmed with present staff. Received instructions for my participation in the procedure from the performing physician.  

## 2017-04-10 NOTE — Progress Notes (Signed)
A and O x3. Report to RN. Tolerated MAC anesthesia well.

## 2017-04-10 NOTE — Patient Instructions (Addendum)
I found and removed 4 polyps - all look benign. You also still have diverticulosis - thickened muscle rings and pouches in the colon wall. Please read the handout about this condition.   I will let you know pathology results and when to have another routine colonoscopy by mail and/or My Chart.  I appreciate the opportunity to care for you. Gatha Mayer, MD, [facility]   Handouts given:polyps and diverticulosis.  YOU HAD AN ENDOSCOPIC PROCEDURE TODAY AT [facility]:   Refer to the procedure report that was given to you for any specific questions about what was found during the examination.  If the procedure report does not answer your questions, please call your gastroenterologist to clarify.  If you requested that your care partner not be given the details of your procedure findings, then the procedure report has been included in a sealed envelope for you to review at your convenience later.  YOU SHOULD EXPECT: Some feelings of bloating in the abdomen. Passage of more gas than usual.  Walking can help get rid of the air that was put into your GI tract during the procedure and reduce the bloating. If you had a lower endoscopy (such as a colonoscopy or flexible sigmoidoscopy) you may notice spotting of blood in your stool or on the toilet paper. If you underwent a bowel prep for your procedure, you may not have a normal bowel movement for a few days.  Please Note:  You might notice some irritation and congestion in your nose or some drainage.  This is from the oxygen used during your procedure.  There is no need for concern and it should clear up in a day or so.  SYMPTOMS TO REPORT IMMEDIATELY:   Following lower endoscopy (colonoscopy or flexible sigmoidoscopy):  Excessive amounts of blood in the stool  Significant tenderness or worsening of abdominal pains  Swelling of the abdomen that is new, acute  Fever of 100F or higher   For urgent or emergent issues, a  gastroenterologist can be reached at any hour by calling [phone].   DIET:  We do recommend a small meal at first, but then you may proceed to your regular diet.  Drink plenty of fluids but you should avoid alcoholic beverages for 24 hours.  ACTIVITY:  You should plan to take it easy for the rest of today and you should NOT DRIVE or use heavy machinery until tomorrow (because of the sedation medicines used during the test).    FOLLOW UP: Our staff will call the number listed on your records the next business day following your procedure to check on you and address any questions or concerns that you may have regarding the information given to you following your procedure. If we do not reach you, we will leave a message.  However, if you are feeling well and you are not experiencing any problems, there is no need to return our call.  We will assume that you have returned to your regular daily activities without incident.  If any biopsies were taken you will be contacted by phone or by letter within the next 1-3 weeks.  Please call us at [phone] if you have not heard about the biopsies in 3 weeks.    SIGNATURES/CONFIDENTIALITY: You and/or your care partner have signed paperwork which will be entered into your electronic medical record.  These signatures attest to the fact that that the information above on your After Visit Summary has been reviewed  and is understood.  Full responsibility of the confidentiality of this discharge information lies with you and/or your care-partner. 

## 2017-04-10 NOTE — Progress Notes (Signed)
Pt's states no medical or surgical changes since previsit or office visit. 

## 2017-04-10 NOTE — Op Note (Signed)
[place] Patient Name: John Berry Procedure Date: 04/10/2017 8:28 AM MRN: [id number] Endoscopist: Gatha Mayer , MD Age: 66 Referring MD:  Date of Birth: October 16, 1950 Gender: Male Account #: [id number] Procedure:                Colonoscopy Indications:              Surveillance: Personal history of adenomatous                            polyps on last colonoscopy 3 years ago Medicines:                Propofol per Anesthesia, Monitored Anesthesia Care Procedure:                Pre-Anesthesia Assessment:                           - Prior to the procedure, a History and Physical                            was performed, and patient medications and                            allergies were reviewed. The patient's tolerance of                            previous anesthesia was also reviewed. The risks                            and benefits of the procedure and the sedation                            options and risks were discussed with the patient.                            All questions were answered, and informed consent                            was obtained. Prior Anticoagulants: The patient has                            taken no previous anticoagulant or antiplatelet                            agents. ASA Grade Assessment: II - A patient with                            mild systemic disease. After reviewing the risks                            and benefits, the patient was deemed in                            satisfactory condition to undergo the procedure.  After obtaining informed consent, the colonoscope                            was passed under direct vision. Throughout the                            procedure, the patient's blood pressure, pulse, and                            oxygen saturations were monitored continuously. The                            Colonoscope was introduced through the anus and   advanced to the the cecum, identified by                            appendiceal orifice and ileocecal valve. The                            colonoscopy was performed without difficulty. The                            patient tolerated the procedure well. The quality                            of the bowel preparation was good. The bowel                            preparation used was Miralax. The ileocecal valve,                            appendiceal orifice, and rectum were photographed. Scope In: 8:40:51 AM Scope Out: 8:53:12 AM Scope Withdrawal Time: 0 hours 11 minutes 14 seconds  Total Procedure Duration: 0 hours 12 minutes 21 seconds  Findings:                 The perianal and digital rectal examinations were                            normal. Pertinent negatives include normal prostate                            (size, shape, and consistency).                           Four sessile polyps were found in the sigmoid                            colon, transverse colon and ascending colon. The                            polyps were 2 to 8 mm in size. These polyps were  removed with a cold snare. Resection and retrieval                            were complete. Verification of patient                            identification for the specimen was done. Estimated                            blood loss was minimal.                           Multiple diverticula were found in the left colon.                           The exam was otherwise without abnormality on                            direct and retroflexion views. Complications:            No immediate complications. Estimated Blood Loss:     Estimated blood loss was minimal. Impression:               - Four 2 to 8 mm polyps in the sigmoid colon, in                            the transverse colon and in the ascending colon,                            removed with a cold snare. Resected and retrieved.                            - Diverticulosis in the left colon.                           - The examination was otherwise normal on direct                            and retroflexion views.                           - Personal history of colonic polyps. Adenomas,                            last exam 2015 Recommendation:           - Patient has a contact number available for                            emergencies. The signs and symptoms of potential                            delayed complications were discussed with the  patient. Return to normal activities tomorrow.                            Written discharge instructions were provided to the                            patient.                           - Resume previous diet.                           - Continue present medications.                           - Repeat colonoscopy is recommended for                            surveillance. The colonoscopy date will be                            determined after pathology results from today's                            exam become available for review. Gatha Mayer, MD 04/10/2017 8:59:29 AM This report has been signed electronically.

## 2017-04-11 ENCOUNTER — Telehealth: Payer: Self-pay | Admitting: *Deleted

## 2017-04-11 NOTE — Telephone Encounter (Signed)
No answer. Name identifier. Message left to call after hours number if any problems or concerns.

## 2017-04-14 ENCOUNTER — Telehealth: Payer: Self-pay | Admitting: *Deleted

## 2017-04-14 NOTE — Telephone Encounter (Signed)
No answer. Name identifier. Message left to call if questions or concerns. 

## 2017-04-17 ENCOUNTER — Encounter: Payer: Self-pay | Admitting: Internal Medicine

## 2017-04-17 DIAGNOSIS — Z86010 Personal history of colonic polyps: Secondary | ICD-10-CM

## 2017-04-17 NOTE — Progress Notes (Signed)
2-3 adenomas max 8 mm Recall 5 yrs 2023

## 2017-04-23 ENCOUNTER — Ambulatory Visit: Payer: Self-pay | Admitting: Orthopedic Surgery

## 2017-04-23 NOTE — H&P (Signed)
John Berry is an 66 y.o. male.   Chief Complaint: back and leg pain HPI: The patient is a 66 year old male who presents today for follow up of their back. The patient is being followed for their low back symptoms. They are now 1 year(s) out from a flare up. Symptoms reported today include: pain, numbness and leg pain. Current treatment includes: activity modification, NSAIDs and pain medications. The following medication has been used for pain control: antiinflammatory medication and gabapentin (1 tab TID). The patient presents today following MRI.  Note:Patient reports of intermittent bilateral leg pain. No change in bowel or bladder function he has had his MRI.  Past Medical History:  Diagnosis Date  . CAD (coronary artery disease)   . Depression   . Diverticulosis   . History of heart artery stent   . Hyperlipidemia   . Hypertension   . OSA (obstructive sleep apnea)   . Personal history of colonic polyps - adenomas 08/13/2010   TUBULAR ADENOMAS (X4)  . Trigeminal neuralgia 4/11   Microvascular decompression    Past Surgical History:  Procedure Laterality Date  . ACHILLES TENDON REPAIR     x 3  . BACK SURGERY  2005  . COLONOSCOPY  08/13/2010  . CRANIOTOMY  10/25/2009   for trigeminal neuralgia  . ELECTROPHYSIOLOGY STUDY N/A 12/16/2011   Procedure: ELECTROPHYSIOLOGY STUDY;  Surgeon: Evans Lance, MD;  Location: [facility] CATH LAB;  Service: Cardiovascular;  Laterality: N/A;  . LEFT HEART CATHETERIZATION WITH CORONARY ANGIOGRAM N/A 12/13/2011   PTCA Procedure:  Surgeon: Hillary Bow, MD;  Location: [facility] CATH LAB;  Service: Cardiovascular;  Laterality: N/A;  . LEFT HEART CATHETERIZATION WITH CORONARY ANGIOGRAM N/A 12/16/2011   Procedure: LEFT HEART CATHETERIZATION WITH CORONARY ANGIOGRAM;  Surgeon: Thayer Headings, MD;  Location[facility] CATH LAB;  Service: Cardiovascular;  Laterality: N/A;  . NASAL SINUS SURGERY    . SPINE SURGERY     lumbar x 2  . uvla platoplasty      Family  History  Problem Relation Age of Onset  . Hypertension Mother   . Heart disease Mother 44  . Hyperlipidemia Mother   . Pancreatic cancer Father   . Heart disease Brother 65  . Colon cancer Neg Hx   . Esophageal cancer Neg Hx   . Prostate cancer Neg Hx   . Rectal cancer Neg Hx   . Stomach cancer Neg Hx    Social History:  reports that he has never smoked. He has never used smokeless tobacco. He reports that he does not drink alcohol or use drugs.  Allergies: No Known Allergies   (Not in a hospital admission)  No results found for this or any previous visit (from the past 48 hour(s)). No results found.  Review of Systems  Constitutional: Negative.   HENT: Negative.   Eyes: Negative.   Respiratory: Negative.   Cardiovascular: Negative.   Gastrointestinal: Negative.   Genitourinary: Negative.   Musculoskeletal: Positive for back pain.  Skin: Negative.   Neurological: Positive for sensory change and focal weakness.  Psychiatric/Behavioral: Negative.     There were no vitals taken for this visit. Physical Exam  Constitutional: He is oriented to person, place, and time. He appears well-developed.  HENT:  Head: Normocephalic.  Eyes: Pupils are equal, round, and reactive to light.  Neck: Normal range of motion.  Cardiovascular: Normal rate.   Respiratory: Effort normal.  GI: Soft.  Musculoskeletal:  Moderate distress. Straight leg raise low  back and buttock pain bilaterally trace EHL weakness bilaterally. Pain with flexion and extension the lumbar spine but limited. He is normoreflexic. Sensory exam is intact. No Babinski or clonus noticeability hips knees and ankles. No flank pain with percussion. Cervical spinal Hoffmann sign motor is 5 5 in the upper extremities.  Neurological: He is alert and oriented to person, place, and time.    MRI lumbar: Demonstrates severe multifactorial stenosis at L4-5 due to facet arthrosis. Postsurgical changes at L5-S1. Severe stenosis at  L2-3 moderate at L3-4.  A large renal cyst on the left at net 9 cm. There is a minimal offset of 4 on 5.  X-rays demonstrate no significant instability with flexion-extension.  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 History of a decompression L5-S1 on the right. Incidental large renal cyst.  Plan to refer him to urology for evaluation of his renal cyst. Please send a copy of this note and the MRI report to the urology group here in town. Terms of his lumbar spine. He does have fairly severe stenosis it would require a decompression to 345 and possibly L3-4. He has a minimal listhesis of 4 5. Feel we could most likely decompress that with without effusion however I will contemplate that further may be a candidate for a Coflex.  Refer him to urology on call him with the further discussion. In the interim due to this severity of the radicular pain EHL weakness I feel he should not be driving therefore give him a note to be out of work. Any changes in the interim he is to call.  A long discussion via telephone conversation with John Berry. I indicated that I spoke with Dr. Rolena Infante and we reviewed his studies. He does have severe stenosis at 4 L4-5 and L2-3. Slight offset but no instability in flexion extension. Has a fairly tall disc at 4-5 and he is mainly right leg pain and inability to walk up to 50-100 yards without severe pain. No change in bowel or bladder function. Do feel that a decompression and so to Dr. Rolena Infante at 4 5 with removal of the neural arch in order to address the stenosis. Lateral mass fusion with autologous and allograft bone graft would be appropriate. We had discussed the Coflex but the degree of decompression would preclude that. In addition we can decompress to 3 as well. Hopefully we can leave the neural arch of 2 and 3.  We discussed the risks and benefits including bleeding infection  persistent symptoms CSF leakage epidural fibrosis need for fusion in the future. No changes in symptoms worsening in symptoms. He is 6 weeks of disability short-term but not long-term. He does not have to lift heavy at work but has to travel long distances. We did discuss possible return to work in 6 weeks. We will have him scheduled accordingly.  Plan microlumbar decompression L2-3, L3-4, L4-5 with lateral mass fusion L4-5 with autograft and allograft bone graft  Cecilie Kicks., PA-C for Dr. Tonita Cong 04/23/2017, 10:51 AM

## 2017-04-28 NOTE — Patient Instructions (Signed)
John Berry  04/28/2017   Your procedure is scheduled on: 04/30/17   Report to [facility] Main  Entrance Take [place]  elevators to 3rd floor to  [place] at    [place] AM.    Call this number if you have problems the morning of surgery [phone]    Remember: ONLY 1 PERSON MAY GO WITH YOU TO SHORT STAY TO GET  READY MORNING OF YOUR SURGERY.  Do not eat food or drink liquids :After Midnight.     Take these medicines the morning of surgery with A SIP OF WATER: metoprolol ( Lopressor), Gabapentin                                 You may not have any metal on your body including hair pins and              piercings  Do not wear jewelry, lotions, powders or perfumes, deodorant                         Men may shave face and neck.   Do not bring valuables to the hospital. [place].  Contacts, dentures or bridgework may not be worn into surgery.  Leave suitcase in the car. After surgery it may be brought to your room.                       Please read over the following fact sheets you were given: _____________________________________________________________________             [facility] - Preparing for Surgery Before surgery, you can play an important role.  Because skin is not sterile, your skin needs to be as free of germs as possible.  You can reduce the number of germs on your skin by washing with CHG (chlorahexidine gluconate) soap before surgery.  CHG is an antiseptic cleaner which kills germs and bonds with the skin to continue killing germs even after washing. Please DO NOT use if you have an allergy to CHG or antibacterial soaps.  If your skin becomes reddened/irritated stop using the CHG and inform your nurse when you arrive at Short Stay. Do not shave (including legs and underarms) for at least 48 hours prior to the first CHG shower.  You may shave your face/neck. Please follow  these instructions carefully:  1.  Shower with CHG Soap the night before surgery and the  morning of Surgery.  2.  If you choose to wash your hair, wash your hair first as usual with your  normal  shampoo.  3.  After you shampoo, rinse your hair and body thoroughly to remove the  shampoo.                           4.  Use CHG as you would any other liquid soap.  You can apply chg directly  to the skin and wash                       Gently with a scrungie or clean washcloth.  5.  Apply the CHG Soap to  your body ONLY FROM THE NECK DOWN.   Do not use on face/ open                           Wound or open sores. Avoid contact with eyes, ears mouth and genitals (private parts).                       Wash face,  Genitals (private parts) with your normal soap.             6.  Wash thoroughly, paying special attention to the area where your surgery  will be performed.  7.  Thoroughly rinse your body with warm water from the neck down.  8.  DO NOT shower/wash with your normal soap after using and rinsing off  the CHG Soap.                9.  Pat yourself dry with a clean towel.            10.  Wear clean pajamas.            11.  Place clean sheets on your bed the night of your first shower and do not  sleep with pets. Day of Surgery : Do not apply any lotions/deodorants the morning of surgery.  Please wear clean clothes to the hospital/surgery center.  FAILURE TO FOLLOW THESE INSTRUCTIONS MAY RESULT IN THE CANCELLATION OF YOUR SURGERY PATIENT SIGNATURE_________________________________  NURSE SIGNATURE__________________________________  ________________________________________________________________________  WHAT IS A BLOOD TRANSFUSION? Blood Transfusion Information  A transfusion is the replacement of blood or some of its parts. Blood is made up of multiple cells which provide different functions.  Red blood cells carry oxygen and are used for blood loss replacement.  White blood cells fight  against infection.  Platelets control bleeding.  Plasma helps clot blood.  Other blood products are available for specialized needs, such as hemophilia or other clotting disorders. BEFORE THE TRANSFUSION  Who gives blood for transfusions?   Healthy volunteers who are fully evaluated to make sure their blood is safe. This is blood bank blood. Transfusion therapy is the safest it has ever been in the practice of medicine. Before blood is taken from a donor, a complete history is taken to make sure that person has no history of diseases nor engages in risky social behavior (examples are intravenous drug use or sexual activity with multiple partners). The donor's travel history is screened to minimize risk of transmitting infections, such as malaria. The donated blood is tested for signs of infectious diseases, such as HIV and hepatitis. The blood is then tested to be sure it is compatible with you in order to minimize the chance of a transfusion reaction. If you or a relative donates blood, this is often done in anticipation of surgery and is not appropriate for emergency situations. It takes many days to process the donated blood. RISKS AND COMPLICATIONS Although transfusion therapy is very safe and saves many lives, the main dangers of transfusion include:   Getting an infectious disease.  Developing a transfusion reaction. This is an allergic reaction to something in the blood you were given. Every precaution is taken to prevent this. The decision to have a blood transfusion has been considered carefully by your caregiver before blood is given. Blood is not given unless the benefits outweigh the risks. AFTER THE TRANSFUSION  Right after receiving a blood transfusion, you will usually  feel much better and more energetic. This is especially true if your red blood cells have gotten low (anemic). The transfusion raises the level of the red blood cells which carry oxygen, and this usually causes an  energy increase.  The nurse administering the transfusion will monitor you carefully for complications. HOME CARE INSTRUCTIONS  No special instructions are needed after a transfusion. You may find your energy is better. Speak with your caregiver about any limitations on activity for underlying diseases you may have. SEEK MEDICAL CARE IF:   Your condition is not improving after your transfusion.  You develop redness or irritation at the intravenous (IV) site. SEEK IMMEDIATE MEDICAL CARE IF:  Any of the following symptoms occur over the next 12 hours:  Shaking chills.  You have a temperature by mouth above 102 F (38.9 C), not controlled by medicine.  Chest, back, or muscle pain.  People around you feel you are not acting correctly or are confused.  Shortness of breath or difficulty breathing.  Dizziness and fainting.  You get a rash or develop hives.  You have a decrease in urine output.  Your urine turns a dark color or changes to pink, red, or brown. Any of the following symptoms occur over the next 10 days:  You have a temperature by mouth above 102 F (38.9 C), not controlled by medicine.  Shortness of breath.  Weakness after normal activity.  The white part of the eye turns yellow (jaundice).  You have a decrease in the amount of urine or are urinating less often.  Your urine turns a dark color or changes to pink, red, or brown. Document Released: 06/14/2000 Document Revised: 09/09/2011 Document Reviewed: 02/01/2008 ExitCare Patient Information 2014 [place].  _______________________________________________________________________  Incentive Spirometer  An incentive spirometer is a tool that can help keep your lungs clear and active. This tool measures how well you are filling your lungs with each breath. Taking long deep breaths may help reverse or decrease the chance of developing breathing (pulmonary) problems (especially infection) following:  A  long period of time when you are unable to move or be active. BEFORE THE PROCEDURE   If the spirometer includes an indicator to show your best effort, your nurse or respiratory therapist will set it to a desired goal.  If possible, sit up straight or lean slightly forward. Try not to slouch.  Hold the incentive spirometer in an upright position. INSTRUCTIONS FOR USE  1. Sit on the edge of your bed if possible, or sit up as far as you can in bed or on a chair. 2. Hold the incentive spirometer in an upright position. 3. Breathe out normally. 4. Place the mouthpiece in your mouth and seal your lips tightly around it. 5. Breathe in slowly and as deeply as possible, raising the piston or the ball toward the top of the column. 6. Hold your breath for 3-5 seconds or for as long as possible. Allow the piston or ball to fall to the bottom of the column. 7. Remove the mouthpiece from your mouth and breathe out normally. 8. Rest for a few seconds and repeat Steps 1 through 7 at least 10 times every 1-2 hours when you are awake. Take your time and take a few normal breaths between deep breaths. 9. The spirometer may include an indicator to show your best effort. Use the indicator as a goal to work toward during each repetition. 10. After each set of 10 deep breaths, practice coughing to  be sure your lungs are clear. If you have an incision (the cut made at the time of surgery), support your incision when coughing by placing a pillow or rolled up towels firmly against it. Once you are able to get out of bed, walk around indoors and cough well. You may stop using the incentive spirometer when instructed by your caregiver.  RISKS AND COMPLICATIONS  Take your time so you do not get dizzy or light-headed.  If you are in pain, you may need to take or ask for pain medication before doing incentive spirometry. It is harder to take a deep breath if you are having pain. AFTER USE  Rest and breathe slowly and  easily.  It can be helpful to keep track of a log of your progress. Your caregiver can provide you with a simple table to help with this. If you are using the spirometer at home, follow these instructions: [place] IF:   You are having difficultly using the spirometer.  You have trouble using the spirometer as often as instructed.  Your pain medication is not giving enough relief while using the spirometer.  You develop fever of 100.5 F (38.1 C) or higher. SEEK IMMEDIATE MEDICAL CARE IF:   You cough up bloody sputum that had not been present before.  You develop fever of 102 F (38.9 C) or greater.  You develop worsening pain at or near the incision site. MAKE SURE YOU:   Understand these instructions.  Will watch your condition.  Will get help right away if you are not doing well or get worse. Document Released: 10/28/2006 Document Revised: 09/09/2011 Document Reviewed: 12/29/2006 [facility] Patient Information 2014 [place], [place].   ________________________________________________________________________

## 2017-04-29 ENCOUNTER — Encounter (HOSPITAL_COMMUNITY): Payer: Self-pay

## 2017-04-29 ENCOUNTER — Encounter (HOSPITAL_COMMUNITY)
Admission: RE | Admit: 2017-04-29 | Discharge: 2017-04-29 | Disposition: A | Discharge status: Home / Self Care | Payer: BLUE CROSS/BLUE SHIELD | Source: Ambulatory Visit | Attending: Specialist | Admitting: Specialist

## 2017-04-29 ENCOUNTER — Ambulatory Visit (HOSPITAL_COMMUNITY)
Admission: RE | Admit: 2017-04-29 | Discharge: 2017-04-29 | Disposition: A | Discharge status: Home / Self Care | Payer: BLUE CROSS/BLUE SHIELD | Source: Ambulatory Visit | Attending: Orthopedic Surgery | Admitting: Orthopedic Surgery

## 2017-04-29 DIAGNOSIS — M5136 Other intervertebral disc degeneration, lumbar region: Secondary | ICD-10-CM | POA: Insufficient documentation

## 2017-04-29 DIAGNOSIS — M48061 Spinal stenosis, lumbar region without neurogenic claudication: Secondary | ICD-10-CM

## 2017-04-29 DIAGNOSIS — Z01818 Encounter for other preprocedural examination: Secondary | ICD-10-CM

## 2017-04-29 DIAGNOSIS — Z01812 Encounter for preprocedural laboratory examination: Secondary | ICD-10-CM | POA: Insufficient documentation

## 2017-04-29 DIAGNOSIS — M5126 Other intervertebral disc displacement, lumbar region: Secondary | ICD-10-CM

## 2017-04-29 HISTORY — DX: Malignant (primary) neoplasm, unspecified: C80.1

## 2017-04-29 LAB — ABO/RH: ABO/RH(D): O POS

## 2017-04-29 LAB — SURGICAL PCR SCREEN
MRSA, PCR: NEGATIVE
Staphylococcus aureus: NEGATIVE

## 2017-04-29 NOTE — Progress Notes (Signed)
EKG done 04/25/17 on chart  Clearance for surgery by Nadean Corwin PAC on chart -04/25/17 LOV- Courtney ForcucciPAC on chart-04/25/17  Labs of CBC/DIFF/CMP-04/25/17 on chart

## 2017-04-29 NOTE — Progress Notes (Signed)
Called office of [facility] at [facility] and requested CBC/DIFf/CMP done 04/25/17 alogn with EKG and office visit note .  Patient saw Dr Marin Comment for clearance for surgery. Office to fax.

## 2017-04-29 NOTE — Progress Notes (Addendum)
EKG-07/09/16-epic LOV-Dr Stanford Breed      07/09/16-epic

## 2017-04-29 NOTE — Progress Notes (Signed)
Back X Ray done 04/29/17 faxed via epic to Dr Susa Day.

## 2017-04-30 ENCOUNTER — Ambulatory Visit (HOSPITAL_COMMUNITY): Payer: BLUE CROSS/BLUE SHIELD | Admitting: Anesthesiology

## 2017-04-30 ENCOUNTER — Encounter (HOSPITAL_COMMUNITY)
Admission: AD | Disposition: A | Discharge status: Home / Self Care | Payer: Self-pay | Source: Ambulatory Visit | Attending: Specialist

## 2017-04-30 ENCOUNTER — Inpatient Hospital Stay (HOSPITAL_COMMUNITY)
Admission: AD | Admit: 2017-04-30 | Discharge: 2017-05-02 | DRG: 460 | Disposition: A | Discharge status: Home / Self Care | Payer: BLUE CROSS/BLUE SHIELD | Source: Ambulatory Visit | Attending: Specialist | Admitting: Specialist

## 2017-04-30 ENCOUNTER — Ambulatory Visit (HOSPITAL_COMMUNITY): Payer: BLUE CROSS/BLUE SHIELD

## 2017-04-30 ENCOUNTER — Encounter (HOSPITAL_COMMUNITY): Payer: Self-pay | Admitting: *Deleted

## 2017-04-30 DIAGNOSIS — M48062 Spinal stenosis, lumbar region with neurogenic claudication: Secondary | ICD-10-CM | POA: Diagnosis present

## 2017-04-30 DIAGNOSIS — Z79899 Other long term (current) drug therapy: Secondary | ICD-10-CM

## 2017-04-30 DIAGNOSIS — Z419 Encounter for procedure for purposes other than remedying health state, unspecified: Secondary | ICD-10-CM

## 2017-04-30 DIAGNOSIS — Z955 Presence of coronary angioplasty implant and graft: Secondary | ICD-10-CM | POA: Diagnosis not present

## 2017-04-30 DIAGNOSIS — I10 Essential (primary) hypertension: Secondary | ICD-10-CM | POA: Diagnosis present

## 2017-04-30 DIAGNOSIS — E785 Hyperlipidemia, unspecified: Secondary | ICD-10-CM | POA: Diagnosis present

## 2017-04-30 DIAGNOSIS — M5116 Intervertebral disc disorders with radiculopathy, lumbar region: Secondary | ICD-10-CM | POA: Diagnosis present

## 2017-04-30 DIAGNOSIS — M48061 Spinal stenosis, lumbar region without neurogenic claudication: Secondary | ICD-10-CM | POA: Diagnosis present

## 2017-04-30 DIAGNOSIS — G500 Trigeminal neuralgia: Secondary | ICD-10-CM | POA: Diagnosis present

## 2017-04-30 DIAGNOSIS — Z7982 Long term (current) use of aspirin: Secondary | ICD-10-CM | POA: Diagnosis not present

## 2017-04-30 DIAGNOSIS — I2510 Atherosclerotic heart disease of native coronary artery without angina pectoris: Secondary | ICD-10-CM | POA: Diagnosis present

## 2017-04-30 DIAGNOSIS — G4733 Obstructive sleep apnea (adult) (pediatric): Secondary | ICD-10-CM | POA: Diagnosis present

## 2017-04-30 HISTORY — PX: LUMBAR LAMINECTOMY/DECOMPRESSION MICRODISCECTOMY: SHX5026

## 2017-04-30 LAB — TYPE AND SCREEN
ABO/RH(D): O POS
Antibody Screen: NEGATIVE

## 2017-04-30 SURGERY — LUMBAR LAMINECTOMY/DECOMPRESSION MICRODISCECTOMY 3 LEVELS
Anesthesia: General | Site: Back

## 2017-04-30 MED ORDER — ACETAMINOPHEN 325 MG PO TABS
650.0000 mg | ORAL_TABLET | ORAL | Status: DC | PRN
  Administered 2017-05-01: 650 mg via ORAL
  Filled 2017-04-30: qty 2

## 2017-04-30 MED ORDER — SODIUM CHLORIDE 0.9 % IV SOLN
INTRAVENOUS | Status: AC
  Filled 2017-04-30: qty 500000

## 2017-04-30 MED ORDER — THROMBIN 5000 UNITS EX SOLR
CUTANEOUS | Status: AC
  Filled 2017-04-30: qty 5000

## 2017-04-30 MED ORDER — HYDROMORPHONE HCL 1 MG/ML IJ SOLN: 0.2500 mg | INTRAMUSCULAR | Status: DC | PRN

## 2017-04-30 MED ORDER — CEFAZOLIN SODIUM-DEXTROSE 2-4 GM/100ML-% IV SOLN
INTRAVENOUS | Status: AC
  Filled 2017-04-30: qty 100

## 2017-04-30 MED ORDER — METHOCARBAMOL 1000 MG/10ML IJ SOLN
500.0000 mg | Freq: Four times a day (QID) | INTRAVENOUS | Status: DC | PRN
  Administered 2017-04-30: 500 mg via INTRAVENOUS
  Filled 2017-04-30: qty 550

## 2017-04-30 MED ORDER — METOPROLOL TARTRATE 12.5 MG HALF TABLET
12.5000 mg | ORAL_TABLET | Freq: Two times a day (BID) | ORAL | Status: DC
  Administered 2017-04-30 – 2017-05-02 (×4): 12.5 mg via ORAL
  Filled 2017-04-30 (×4): qty 1

## 2017-04-30 MED ORDER — MIDAZOLAM HCL 2 MG/2ML IJ SOLN
INTRAMUSCULAR | Status: DC | PRN
  Administered 2017-04-30: 2 mg via INTRAVENOUS

## 2017-04-30 MED ORDER — HYDROMORPHONE HCL 2 MG/ML IJ SOLN
INTRAMUSCULAR | Status: AC
  Filled 2017-04-30: qty 1

## 2017-04-30 MED ORDER — EPHEDRINE 5 MG/ML INJ
INTRAVENOUS | Status: AC
  Filled 2017-04-30: qty 10

## 2017-04-30 MED ORDER — OXYCODONE HCL 5 MG/5ML PO SOLN: 5.0000 mg | Freq: Once | ORAL | Status: DC | PRN

## 2017-04-30 MED ORDER — MINERAL OIL LIGHT 100 % EX OIL
TOPICAL_OIL | CUTANEOUS | Status: AC
  Filled 2017-04-30: qty 25

## 2017-04-30 MED ORDER — CEFAZOLIN SODIUM-DEXTROSE 2-4 GM/100ML-% IV SOLN
2.0000 g | INTRAVENOUS | Status: AC
  Administered 2017-04-30 (×2): 2 g via INTRAVENOUS

## 2017-04-30 MED ORDER — DOCUSATE SODIUM 100 MG PO CAPS: 100.0000 mg | ORAL_CAPSULE | Freq: Two times a day (BID) | ORAL | 1 refills | Status: DC | PRN

## 2017-04-30 MED ORDER — PROPOFOL 10 MG/ML IV BOLUS
INTRAVENOUS | Status: AC
  Filled 2017-04-30: qty 20

## 2017-04-30 MED ORDER — ROCURONIUM BROMIDE 50 MG/5ML IV SOSY
PREFILLED_SYRINGE | INTRAVENOUS | Status: AC
  Filled 2017-04-30: qty 5

## 2017-04-30 MED ORDER — ROCURONIUM BROMIDE 10 MG/ML (PF) SYRINGE
PREFILLED_SYRINGE | INTRAVENOUS | Status: DC | PRN
  Administered 2017-04-30 (×5): 10 mg via INTRAVENOUS
  Administered 2017-04-30: 5 mg via INTRAVENOUS
  Administered 2017-04-30: 10 mg via INTRAVENOUS
  Administered 2017-04-30: 50 mg via INTRAVENOUS
  Administered 2017-04-30: 10 mg via INTRAVENOUS

## 2017-04-30 MED ORDER — SUCCINYLCHOLINE CHLORIDE 200 MG/10ML IV SOSY
PREFILLED_SYRINGE | INTRAVENOUS | Status: AC
  Filled 2017-04-30: qty 10

## 2017-04-30 MED ORDER — METHOCARBAMOL 500 MG PO TABS: 500.0000 mg | ORAL_TABLET | Freq: Four times a day (QID) | ORAL | 1 refills | Status: DC | PRN

## 2017-04-30 MED ORDER — OXYCODONE HCL 5 MG PO TABS
10.0000 mg | ORAL_TABLET | ORAL | Status: DC | PRN
  Administered 2017-04-30 (×2): 5 mg via ORAL
  Administered 2017-05-02 (×3): 10 mg via ORAL
  Filled 2017-04-30 (×6): qty 2

## 2017-04-30 MED ORDER — THROMBIN (RECOMBINANT) 5000 UNITS EX SOLR
CUTANEOUS | Status: AC
  Filled 2017-04-30: qty 5000

## 2017-04-30 MED ORDER — LIDOCAINE 2% (20 MG/ML) 5 ML SYRINGE
INTRAMUSCULAR | Status: DC | PRN
  Administered 2017-04-30: 100 mg via INTRAVENOUS

## 2017-04-30 MED ORDER — ACETAMINOPHEN 10 MG/ML IV SOLN
INTRAVENOUS | Status: AC
  Filled 2017-04-30: qty 100

## 2017-04-30 MED ORDER — METHOCARBAMOL 500 MG PO TABS
500.0000 mg | ORAL_TABLET | Freq: Four times a day (QID) | ORAL | Status: DC | PRN
  Administered 2017-05-01 – 2017-05-02 (×4): 500 mg via ORAL
  Filled 2017-04-30 (×4): qty 1

## 2017-04-30 MED ORDER — BISACODYL 5 MG PO TBEC
5.0000 mg | DELAYED_RELEASE_TABLET | Freq: Every day | ORAL | Status: DC | PRN
  Administered 2017-05-02: 5 mg via ORAL
  Filled 2017-04-30: qty 1

## 2017-04-30 MED ORDER — HYDROMORPHONE HCL 1 MG/ML IJ SOLN
INTRAMUSCULAR | Status: DC | PRN
  Administered 2017-04-30 (×6): .5 mg via INTRAVENOUS

## 2017-04-30 MED ORDER — BUPIVACAINE-EPINEPHRINE (PF) 0.5% -1:200000 IJ SOLN
INTRAMUSCULAR | Status: AC
  Filled 2017-04-30: qty 30

## 2017-04-30 MED ORDER — SODIUM CHLORIDE 0.9 % IJ SOLN
INTRAMUSCULAR | Status: AC
  Filled 2017-04-30: qty 10

## 2017-04-30 MED ORDER — ONDANSETRON HCL 4 MG/2ML IJ SOLN
INTRAMUSCULAR | Status: DC | PRN
  Administered 2017-04-30: 4 mg via INTRAVENOUS

## 2017-04-30 MED ORDER — SUFENTANIL CITRATE 50 MCG/ML IV SOLN
INTRAVENOUS | Status: AC
  Filled 2017-04-30: qty 1

## 2017-04-30 MED ORDER — DEXAMETHASONE SODIUM PHOSPHATE 10 MG/ML IJ SOLN
INTRAMUSCULAR | Status: DC | PRN
  Administered 2017-04-30: 10 mg via INTRAVENOUS

## 2017-04-30 MED ORDER — EPHEDRINE SULFATE 50 MG/ML IJ SOLN
INTRAMUSCULAR | Status: DC | PRN
  Administered 2017-04-30 (×2): 5 mg via INTRAVENOUS

## 2017-04-30 MED ORDER — ROCURONIUM BROMIDE 50 MG/5ML IV SOSY
PREFILLED_SYRINGE | INTRAVENOUS | Status: AC
Start: 2017-04-30 — End: 2017-04-30
  Filled 2017-04-30: qty 5

## 2017-04-30 MED ORDER — ONDANSETRON HCL 4 MG/2ML IJ SOLN
INTRAMUSCULAR | Status: AC
  Filled 2017-04-30: qty 2

## 2017-04-30 MED ORDER — PROPOFOL 10 MG/ML IV BOLUS
INTRAVENOUS | Status: DC | PRN
  Administered 2017-04-30: 200 mg via INTRAVENOUS

## 2017-04-30 MED ORDER — RISAQUAD PO CAPS
1.0000 | ORAL_CAPSULE | Freq: Every day | ORAL | Status: DC
  Administered 2017-05-02: 1 via ORAL
  Filled 2017-04-30 (×2): qty 1

## 2017-04-30 MED ORDER — ONDANSETRON HCL 4 MG/2ML IJ SOLN: 4.0000 mg | Freq: Four times a day (QID) | INTRAMUSCULAR | Status: DC | PRN

## 2017-04-30 MED ORDER — HYDROMORPHONE HCL 1 MG/ML IJ SOLN: 1.0000 mg | INTRAMUSCULAR | Status: DC | PRN

## 2017-04-30 MED ORDER — SUFENTANIL CITRATE 50 MCG/ML IV SOLN
INTRAVENOUS | Status: DC | PRN
  Administered 2017-04-30: 10 ug via INTRAVENOUS
  Administered 2017-04-30: 20 ug via INTRAVENOUS
  Administered 2017-04-30 (×2): 10 ug via INTRAVENOUS

## 2017-04-30 MED ORDER — SUGAMMADEX SODIUM 500 MG/5ML IV SOLN
INTRAVENOUS | Status: AC
Start: 2017-04-30 — End: 2017-04-30
  Filled 2017-04-30: qty 5

## 2017-04-30 MED ORDER — ACETAMINOPHEN 650 MG RE SUPP: 650.0000 mg | RECTAL | Status: DC | PRN

## 2017-04-30 MED ORDER — POLYETHYLENE GLYCOL 3350 17 G PO PACK: 17.0000 g | PACK | Freq: Every day | ORAL | 0 refills | Status: DC

## 2017-04-30 MED ORDER — DEXAMETHASONE SODIUM PHOSPHATE 10 MG/ML IJ SOLN
INTRAMUSCULAR | Status: AC
  Filled 2017-04-30: qty 1

## 2017-04-30 MED ORDER — PHENOL 1.4 % MT LIQD: 1.0000 | OROMUCOSAL | Status: DC | PRN

## 2017-04-30 MED ORDER — LIDOCAINE 2% (20 MG/ML) 5 ML SYRINGE
INTRAMUSCULAR | Status: AC
  Filled 2017-04-30: qty 5

## 2017-04-30 MED ORDER — GABAPENTIN 300 MG PO CAPS
300.0000 mg | ORAL_CAPSULE | Freq: Three times a day (TID) | ORAL | Status: DC
  Administered 2017-04-30 – 2017-05-02 (×5): 300 mg via ORAL
  Filled 2017-04-30 (×6): qty 1

## 2017-04-30 MED ORDER — MENTHOL 3 MG MT LOZG: 1.0000 | LOZENGE | OROMUCOSAL | Status: DC | PRN

## 2017-04-30 MED ORDER — LACTATED RINGERS IV SOLN
INTRAVENOUS | Status: DC
  Administered 2017-04-30 (×3): via INTRAVENOUS

## 2017-04-30 MED ORDER — NITROGLYCERIN 0.4 MG SL SUBL: 0.4000 mg | SUBLINGUAL_TABLET | SUBLINGUAL | Status: DC | PRN

## 2017-04-30 MED ORDER — THROMBIN (RECOMBINANT) 5000 UNITS EX SOLR
CUTANEOUS | Status: DC | PRN
  Administered 2017-04-30 (×4): 5000 [IU] via TOPICAL

## 2017-04-30 MED ORDER — ACETAMINOPHEN 10 MG/ML IV SOLN
1000.0000 mg | INTRAVENOUS | Status: AC
  Administered 2017-04-30: 1000 mg via INTRAVENOUS

## 2017-04-30 MED ORDER — BUPIVACAINE-EPINEPHRINE (PF) 0.5% -1:200000 IJ SOLN
INTRAMUSCULAR | Status: DC | PRN
  Administered 2017-04-30: 17 mL

## 2017-04-30 MED ORDER — POLYETHYLENE GLYCOL 3350 17 G PO PACK
17.0000 g | PACK | Freq: Every day | ORAL | Status: DC | PRN
  Administered 2017-05-01 – 2017-05-02 (×2): 17 g via ORAL
  Filled 2017-04-30 (×2): qty 1

## 2017-04-30 MED ORDER — OXYCODONE-ACETAMINOPHEN 5-325 MG PO TABS: 1.0000 | ORAL_TABLET | ORAL | 0 refills | Status: DC | PRN

## 2017-04-30 MED ORDER — CEFAZOLIN SODIUM-DEXTROSE 2-4 GM/100ML-% IV SOLN
2.0000 g | Freq: Three times a day (TID) | INTRAVENOUS | Status: AC
  Administered 2017-04-30 – 2017-05-01 (×2): 2 g via INTRAVENOUS
  Filled 2017-04-30 (×2): qty 100

## 2017-04-30 MED ORDER — OXYCODONE HCL 5 MG PO TABS: 5.0000 mg | ORAL_TABLET | Freq: Once | ORAL | Status: DC | PRN

## 2017-04-30 MED ORDER — SUGAMMADEX SODIUM 200 MG/2ML IV SOLN
INTRAVENOUS | Status: DC | PRN
  Administered 2017-04-30: 300 mg via INTRAVENOUS

## 2017-04-30 MED ORDER — OXYCODONE HCL 5 MG PO TABS
5.0000 mg | ORAL_TABLET | ORAL | Status: DC | PRN
  Administered 2017-05-01 – 2017-05-02 (×4): 5 mg via ORAL
  Filled 2017-04-30 (×3): qty 1

## 2017-04-30 MED ORDER — SODIUM CHLORIDE 0.9 % IV SOLN
INTRAVENOUS | Status: DC | PRN
  Administered 2017-04-30 (×2): 500 mL

## 2017-04-30 MED ORDER — MAGNESIUM CITRATE PO SOLN
1.0000 | Freq: Once | ORAL | Status: AC | PRN
  Filled 2017-04-30: qty 296

## 2017-04-30 MED ORDER — ONDANSETRON HCL 4 MG PO TABS: 4.0000 mg | ORAL_TABLET | Freq: Four times a day (QID) | ORAL | Status: DC | PRN

## 2017-04-30 MED ORDER — MIDAZOLAM HCL 2 MG/2ML IJ SOLN
INTRAMUSCULAR | Status: AC
  Filled 2017-04-30: qty 2

## 2017-04-30 MED ORDER — KCL IN DEXTROSE-NACL 20-5-0.45 MEQ/L-%-% IV SOLN
INTRAVENOUS | Status: AC
  Administered 2017-04-30: 50 mL/h via INTRAVENOUS
  Filled 2017-04-30: qty 1000

## 2017-04-30 MED ORDER — ACETAMINOPHEN 10 MG/ML IV SOLN
1000.0000 mg | Freq: Four times a day (QID) | INTRAVENOUS | Status: AC
  Administered 2017-04-30 – 2017-05-01 (×3): 1000 mg via INTRAVENOUS
  Filled 2017-04-30 (×3): qty 100

## 2017-04-30 MED ORDER — MINERAL OIL LIGHT 100 % EX OIL
TOPICAL_OIL | CUTANEOUS | Status: DC | PRN
  Administered 2017-04-30: 1 via TOPICAL

## 2017-04-30 MED ORDER — DOCUSATE SODIUM 100 MG PO CAPS
100.0000 mg | ORAL_CAPSULE | Freq: Two times a day (BID) | ORAL | Status: DC
  Administered 2017-04-30 – 2017-05-02 (×4): 100 mg via ORAL
  Filled 2017-04-30 (×4): qty 1

## 2017-04-30 MED ORDER — ALUM & MAG HYDROXIDE-SIMETH 200-200-20 MG/5ML PO SUSP: 30.0000 mL | Freq: Four times a day (QID) | ORAL | Status: DC | PRN

## 2017-04-30 SURGICAL SUPPLY — 52 items
BAG ZIPLOCK 12X15 (MISCELLANEOUS) IMPLANT
BUR OVAL CARBIDE 4.0 (BURR) ×2 IMPLANT
CLEANER TIP ELECTROSURG 2X2 (MISCELLANEOUS) ×2 IMPLANT
CLOTH 2% CHLOROHEXIDINE 3PK (PERSONAL CARE ITEMS) ×2 IMPLANT
CONT SPECI 4OZ STER CLIK (MISCELLANEOUS) ×2 IMPLANT
COVER SURGICAL LIGHT HANDLE (MISCELLANEOUS) ×2 IMPLANT
DRAPE MICROSCOPE LEICA (MISCELLANEOUS) ×2 IMPLANT
DRAPE SHEET LG 3/4 BI-LAMINATE (DRAPES) IMPLANT
DRAPE SURG 17X11 SM STRL (DRAPES) ×2 IMPLANT
DRAPE UTILITY XL STRL (DRAPES) ×2 IMPLANT
DRSG AQUACEL AG ADV 3.5X 4 (GAUZE/BANDAGES/DRESSINGS) IMPLANT
DRSG AQUACEL AG ADV 3.5X 6 (GAUZE/BANDAGES/DRESSINGS) ×2 IMPLANT
DURAPREP 26ML APPLICATOR (WOUND CARE) ×2 IMPLANT
DURASEAL SPINE SEALANT 3ML (MISCELLANEOUS) IMPLANT
ELECT BLADE TIP CTD 4 INCH (ELECTRODE) ×2 IMPLANT
ELECT REM PT RETURN 15FT ADLT (MISCELLANEOUS) ×2 IMPLANT
GLOVE BIOGEL PI IND STRL 7.0 (GLOVE) ×5 IMPLANT
GLOVE BIOGEL PI INDICATOR 7.0 (GLOVE) ×5
GLOVE SURG SS PI 7.0 STRL IVOR (GLOVE) ×2 IMPLANT
GLOVE SURG SS PI 8.0 STRL IVOR (GLOVE) ×4 IMPLANT
GOWN STRL REUS W/TWL XL LVL3 (GOWN DISPOSABLE) ×6 IMPLANT
HEMOSTAT SPONGE AVITENE ULTRA (HEMOSTASIS) IMPLANT
IV CATH 14GX2 1/4 (CATHETERS) ×2 IMPLANT
KIT BASIN OR (CUSTOM PROCEDURE TRAY) ×2 IMPLANT
KIT POSITIONING SURG ANDREWS (MISCELLANEOUS) ×2 IMPLANT
MANIFOLD NEPTUNE II (INSTRUMENTS) ×2 IMPLANT
MARKER SKIN DUAL TIP RULER LAB (MISCELLANEOUS) ×2 IMPLANT
NEEDLE SPNL 18GX3.5 QUINCKE PK (NEEDLE) ×10 IMPLANT
PACK LAMINECTOMY ORTHO (CUSTOM PROCEDURE TRAY) ×2 IMPLANT
PATTIES SURGICAL .5 X.5 (GAUZE/BANDAGES/DRESSINGS) ×4 IMPLANT
PATTIES SURGICAL .75X.75 (GAUZE/BANDAGES/DRESSINGS) ×2 IMPLANT
PATTIES SURGICAL 1X1 (DISPOSABLE) IMPLANT
PUTTY DBM ALLOSYNC PURE 10CC (Putty) ×2 IMPLANT
RUBBERBAND STERILE (MISCELLANEOUS) ×2 IMPLANT
SPONGE LAP 4X18 X RAY DECT (DISPOSABLE) ×8 IMPLANT
SPONGE SURGIFOAM ABS GEL 100 (HEMOSTASIS) ×2 IMPLANT
STAPLER VISISTAT (STAPLE) ×2 IMPLANT
STRIP CLOSURE SKIN 1/2X4 (GAUZE/BANDAGES/DRESSINGS) IMPLANT
SUT NURALON 4 0 TR CR/8 (SUTURE) IMPLANT
SUT PROLENE 3 0 PS 2 (SUTURE) IMPLANT
SUT STRATAFIX 0 PDS 27 VIOLET (SUTURE) ×2
SUT VIC AB 1 CT1 27 (SUTURE) ×3
SUT VIC AB 1 CT1 27XBRD ANTBC (SUTURE) ×3 IMPLANT
SUT VIC AB 1-0 CT2 27 (SUTURE) IMPLANT
SUT VIC AB 2-0 CT1 27 (SUTURE) ×2
SUT VIC AB 2-0 CT1 TAPERPNT 27 (SUTURE) ×2 IMPLANT
SUT VIC AB 2-0 CT2 27 (SUTURE) IMPLANT
SUTURE STRATFX 0 PDS 27 VIOLET (SUTURE) ×1 IMPLANT
SYR 3ML LL SCALE MARK (SYRINGE) IMPLANT
TOWEL OR 17X26 10 PK STRL BLUE (TOWEL DISPOSABLE) ×2 IMPLANT
TOWEL OR NON WOVEN STRL DISP B (DISPOSABLE) ×2 IMPLANT
YANKAUER SUCT BULB TIP NO VENT (SUCTIONS) ×2 IMPLANT

## 2017-04-30 NOTE — Discharge Instructions (Signed)

## 2017-04-30 NOTE — Interval H&P Note (Signed)
History and Physical Interval Note:  04/30/2017 7:36 AM  John Berry  has presented today for surgery, with the diagnosis of Spinal Stenosis  The various methods of treatment have been discussed with the patient and family. After consideration of risks, benefits and other options for treatment, the patient has consented to  Procedure(s): Microlumbar decompression L4-5, L3-4, L2-3, lateral mass fusion L4-5 with autograft/allograft bone (N/A) as a surgical intervention .  The patient's history has been reviewed, patient examined, no change in status, stable for surgery.  I have reviewed the patient's chart and labs.  Questions were answered to the patient's satisfaction.     Hitomi Slape C

## 2017-04-30 NOTE — Transfer of Care (Signed)
Immediate Anesthesia Transfer of Care Note  Patient: John Berry  Procedure(s) Performed: Microlumbar decompression L4-5, L3-4, L2-3, lateral mass fusion L4-5 with autograft/allograft bone (N/A Back)  Patient Location: PACU  Anesthesia Type:General  Level of Consciousness: awake  Airway & Oxygen Therapy: Patient Spontanous Breathing and Patient connected to face mask oxygen  Post-op Assessment: Report given to RN and Post -op Vital signs reviewed and stable  Post vital signs: Reviewed and stable  Last Vitals:  Vitals:   04/30/17 0539  BP: (!) 153/87  Pulse: (!) 54  Resp: 16  Temp: 36.8 C  SpO2: 100%    Last Pain:  Vitals:   04/30/17 0604  TempSrc:   PainSc: 3       Patients Stated Pain Goal: 4 (16/10/96 0454)  Complications: No apparent anesthesia complications

## 2017-04-30 NOTE — Anesthesia Procedure Notes (Signed)
Procedure Name: Intubation Date/Time: 04/30/2017 7:49 AM Performed by: Danley Danker L Patient Re-evaluated:Patient Re-evaluated prior to induction Oxygen Delivery Method: Circle system utilized Preoxygenation: Pre-oxygenation with 100% oxygen Induction Type: IV induction Ventilation: Mask ventilation without difficulty and Oral airway inserted - appropriate to patient size Laryngoscope Size: Miller and 3 Grade View: Grade II Tube type: Oral Tube size: 8.0 mm Number of attempts: 1 Airway Equipment and Method: Stylet Placement Confirmation: ETT inserted through vocal cords under direct vision,  positive ETCO2 and breath sounds checked- equal and bilateral Secured at: 23 cm Tube secured with: Tape Dental Injury: Teeth and Oropharynx as per pre-operative assessment

## 2017-04-30 NOTE — Evaluation (Signed)
Physical Therapy Evaluation Patient Details Name: John Berry MRN: [id number] DOB: Jun 22, 1951 Today's Date: 04/30/2017   History of Present Illness  Pt s/p L2-3, L3-4, L4-5 micro-lumbar decompression with L4-5 lateral mass fusion.  Clinical Impression  Pt s/p back surgery and presents with functional mobility limitations 2* post op pain and back precautions.  Pt should progress to dc home with family assist.    Follow Up Recommendations No PT follow up    Equipment Recommendations  Other (comment) (TBD)    Recommendations for Other Services OT consult     Precautions / Restrictions Precautions Precautions: Back Precaution Booklet Issued: Yes (comment) Precaution Comments: back precautions reviewed x 2 Restrictions Weight Bearing Restrictions: No      Mobility  Bed Mobility Overal bed mobility: Needs Assistance Bed Mobility: Rolling;Sidelying to Sit Rolling: Min assist Sidelying to sit: Min assist       General bed mobility comments: cues for back precautions adherence and correct log roll technique  Transfers Overall transfer level: Needs assistance   Transfers: Sit to/from Stand Sit to Stand: Min assist         General transfer comment: cues for transition position, use of UEs to self assist and adherence to back precautions  Ambulation/Gait Ambulation/Gait assistance: Min assist Ambulation Distance (Feet): 100 Feet Assistive device: Rolling walker (2 wheeled);1 person hand held assist Gait Pattern/deviations: Step-through pattern;Decreased step length - right;Decreased step length - left;Shuffle;Trunk flexed;Antalgic Gait velocity: decr Gait velocity interpretation: Below normal speed for age/gender General Gait Details: 78' with RW and 50' hand held assist for stability.  Cues for posture and position from RW  Stairs            Wheelchair Mobility    Modified Rankin (Stroke Patients Only)       Balance Overall balance assessment:  Needs assistance Sitting-balance support: Feet supported;No upper extremity supported Sitting balance-Leahy Scale: Good     Standing balance support: No upper extremity supported Standing balance-Leahy Scale: Fair                               Pertinent Vitals/Pain Pain Assessment: 0-10 Pain Score: 4  Pain Location: back Pain Descriptors / Indicators: Aching;Sore Pain Intervention(s): Limited activity within patient's tolerance;Monitored during session;Premedicated before session    [place] expects to be discharged to:: Private residence Living Arrangements: Spouse/significant other Available Help at Discharge: Family Type of Home: House Home Access: Stairs to enter   Technical brewer of Steps: 1 Home Layout: Two level Home Equipment: None      Prior Function Level of Independence: Independent               Hand Dominance        Extremity/Trunk Assessment   Upper Extremity Assessment Upper Extremity Assessment: Overall WFL for tasks assessed    Lower Extremity Assessment Lower Extremity Assessment: Overall WFL for tasks assessed       Communication   Communication: No difficulties  Cognition Arousal/Alertness: Awake/alert Behavior During Therapy: WFL for tasks assessed/performed Overall Cognitive Status: Within Functional Limits for tasks assessed                                        General Comments      Exercises     Assessment/Plan    PT Assessment Patient needs continued PT services  PT Problem List Decreased strength;Decreased activity tolerance;Decreased balance;Decreased mobility;Decreased knowledge of use of DME;Pain       PT Treatment Interventions DME instruction;Gait training;Stair training;Functional mobility training;Therapeutic activities;Therapeutic exercise;Patient/family education    PT Goals (Current goals can be found in the Care Plan section)  Acute Rehab PT  Goals Patient Stated Goal: Regain IND and no more leg pain/numbness PT Goal Formulation: With patient Time For Goal Achievement: 05/03/17 Potential to Achieve Goals: Good    Frequency 7X/week   Barriers to discharge        Co-evaluation               AM-PAC PT "6 Clicks" Daily Activity  Outcome Measure Difficulty turning over in bed (including adjusting bedclothes, sheets and blankets)?: Unable Difficulty moving from lying on back to sitting on the side of the bed? : Unable Difficulty sitting down on and standing up from a chair with arms (e.g., wheelchair, bedside commode, etc,.)?: Unable Help needed moving to and from a bed to chair (including a wheelchair)?: A Little Help needed walking in hospital room?: A Little Help needed climbing 3-5 steps with a railing? : A Little 6 Click Score: 12    End of Session   Activity Tolerance: Patient tolerated treatment well Patient left: in chair;with call bell/phone within reach;with family/visitor present Nurse Communication: Mobility status PT Visit Diagnosis: Difficulty in walking, not elsewhere classified (R26.2)    Time: 0240-9735 PT Time Calculation (min) (ACUTE ONLY): 20 min   Charges:   PT Evaluation $PT Eval Low Complexity: 1 Low     PT G Codes:   PT G-Codes **NOT FOR INPATIENT CLASS** Functional Assessment Tool Used: Clinical judgement Functional Limitation: Mobility: Walking and moving around Mobility: Walking and Moving Around Current Status (H2992): At least 40 percent but less than 60 percent impaired, limited or restricted Mobility: Walking and Moving Around Goal Status [phone]): At least 1 percent but less than 20 percent impaired, limited or restricted    Pg [phone]   Durand Wittmeyer 04/30/2017, 5:28 PM

## 2017-04-30 NOTE — Brief Op Note (Signed)
04/30/2017  12:12 PM  PATIENT:  Alwyn Pea  66 y.o. male  PRE-OPERATIVE DIAGNOSIS:  Spinal Stenosis  POST-OPERATIVE DIAGNOSIS:  Spinal Stenosis  PROCEDURE:  Procedure(s): Microlumbar decompression L4-5, L3-4, L2-3, lateral mass fusion L4-5 with autograft/allograft bone (N/A)  SURGEON:  Surgeon(s) and Role:    Susa Day, MD - Primary  PHYSICIAN ASSISTANT:   ASSISTANTS: [place]   ANESTHESIA:   spinal  EBL:  400 mL   BLOOD ADMINISTERED:none  DRAINS: none   LOCAL MEDICATIONS USED:  MARCAINE     SPECIMEN:  No Specimen  DISPOSITION OF SPECIMEN:  N/A  COUNTS:  YES  TOURNIQUET:  * No tourniquets in log *  DICTATION: .Other Dictation: Dictation Number [id number]  PLAN OF CARE: Admit to inpatient   PATIENT DISPOSITION:  PACU - hemodynamically stable.   Delay start of Pharmacological VTE agent (>24hrs) due to surgical blood loss or risk of bleeding: yes

## 2017-04-30 NOTE — Interval H&P Note (Signed)
History and Physical Interval Note:  04/30/2017 7:36 AM  John Berry  has presented today for surgery, with the diagnosis of Spinal Stenosis  The various methods of treatment have been discussed with the patient and family. After consideration of risks, benefits and other options for treatment, the patient has consented to  Procedure(s): Microlumbar decompression L4-5, L3-4, L2-3, lateral mass fusion L4-5 with autograft/allograft bone (N/A) as a surgical intervention .  The patient's history has been reviewed, patient examined, no change in status, stable for surgery.  I have reviewed the patient's chart and labs.  Questions were answered to the patient's satisfaction.     [name] C

## 2017-04-30 NOTE — Anesthesia Postprocedure Evaluation (Signed)
Anesthesia Post Note  Patient: HAWARD POPE  Procedure(s) Performed: Microlumbar decompression L4-5, L3-4, L2-3, lateral mass fusion L4-5 with autograft/allograft bone (N/A Back)     Patient location during evaluation: PACU Anesthesia Type: General Level of consciousness: awake and alert Pain management: pain level controlled Vital Signs Assessment: post-procedure vital signs reviewed and stable Respiratory status: spontaneous breathing, nonlabored ventilation, respiratory function stable and patient connected to nasal cannula oxygen Cardiovascular status: blood pressure returned to baseline and stable Postop Assessment: no apparent nausea or vomiting Anesthetic complications: no    Last Vitals:  Vitals:   04/30/17 1345 04/30/17 1400  BP: (!) 141/77 (!) 157/92  Pulse: 81 80  Resp: 12 13  Temp: (!) 36.4 C   SpO2: 99% 98%    Last Pain:  Vitals:   04/30/17 1345  TempSrc:   PainSc: 0-No pain                 Naquan Garman S

## 2017-04-30 NOTE — H&P (View-Only) (Signed)
John Berry is an 66 y.o. male.   Chief Complaint: back and leg pain HPI: The patient is a 66 year old male who presents today for follow up of their back. The patient is being followed for their low back symptoms. They are now 1 year(s) out from a flare up. Symptoms reported today include: pain, numbness and leg pain. Current treatment includes: activity modification, NSAIDs and pain medications. The following medication has been used for pain control: antiinflammatory medication and gabapentin (1 tab TID). The patient presents today following MRI.  Note:Patient reports of intermittent bilateral leg pain. No change in bowel or bladder function he has had his MRI.  Past Medical History:  Diagnosis Date  . CAD (coronary artery disease)   . Depression   . Diverticulosis   . History of heart artery stent   . Hyperlipidemia   . Hypertension   . OSA (obstructive sleep apnea)   . Personal history of colonic polyps - adenomas 08/13/2010   TUBULAR ADENOMAS (X4)  . Trigeminal neuralgia 4/11   Microvascular decompression    Past Surgical History:  Procedure Laterality Date  . ACHILLES TENDON REPAIR     x 3  . BACK SURGERY  2005  . COLONOSCOPY  08/13/2010  . CRANIOTOMY  10/25/2009   for trigeminal neuralgia  . ELECTROPHYSIOLOGY STUDY N/A 12/16/2011   Procedure: ELECTROPHYSIOLOGY STUDY;  Surgeon: Evans Lance, MD;  Location: [facility] CATH LAB;  Service: Cardiovascular;  Laterality: N/A;  . LEFT HEART CATHETERIZATION WITH CORONARY ANGIOGRAM N/A 12/13/2011   PTCA Procedure:  Surgeon: Hillary Bow, MD;  Location: [facility] CATH LAB;  Service: Cardiovascular;  Laterality: N/A;  . LEFT HEART CATHETERIZATION WITH CORONARY ANGIOGRAM N/A 12/16/2011   Procedure: LEFT HEART CATHETERIZATION WITH CORONARY ANGIOGRAM;  Surgeon: Thayer Headings, MD;  Location: [facility] CATH LAB;  Service: Cardiovascular;  Laterality: N/A;  . NASAL SINUS SURGERY    . SPINE SURGERY     lumbar x 2  . uvla platoplasty      Family  History  Problem Relation Age of Onset  . Hypertension Mother   . Heart disease Mother 63  . Hyperlipidemia Mother   . Pancreatic cancer Father   . Heart disease Brother 79  . Colon cancer Neg Hx   . Esophageal cancer Neg Hx   . Prostate cancer Neg Hx   . Rectal cancer Neg Hx   . Stomach cancer Neg Hx    Social History:  reports that he has never smoked. He has never used smokeless tobacco. He reports that he does not drink alcohol or use drugs.  Allergies: No Known Allergies   (Not in a hospital admission)  No results found for this or any previous visit (from the past 48 hour(s)). No results found.  Review of Systems  Constitutional: Negative.   HENT: Negative.   Eyes: Negative.   Respiratory: Negative.   Cardiovascular: Negative.   Gastrointestinal: Negative.   Genitourinary: Negative.   Musculoskeletal: Positive for back pain.  Skin: Negative.   Neurological: Positive for sensory change and focal weakness.  Psychiatric/Behavioral: Negative.     There were no vitals taken for this visit. Physical Exam  Constitutional: He is oriented to person, place, and time. He appears well-developed.  HENT:  Head: Normocephalic.  Eyes: Pupils are equal, round, and reactive to light.  Neck: Normal range of motion.  Cardiovascular: Normal rate.   Respiratory: Effort normal.  GI: Soft.  Musculoskeletal:  Moderate distress. Straight leg raise low  back and buttock pain bilaterally trace EHL weakness bilaterally. Pain with flexion and extension the lumbar spine but limited. He is normoreflexic. Sensory exam is intact. No Babinski or clonus noticeability hips knees and ankles. No flank pain with percussion. Cervical spinal Hoffmann sign motor is 5 5 in the upper extremities.  Neurological: He is alert and oriented to person, place, and time.    MRI lumbar: Demonstrates severe multifactorial stenosis at L4-5 due to facet arthrosis. Postsurgical changes at L5-S1. Severe stenosis at  L2-3 moderate at L3-4.  A large renal cyst on the left at net 9 cm. There is a minimal offset of 4 on 5.  X-rays demonstrate no significant instability with flexion-extension.  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 History of a decompression L5-S1 on the right. Incidental large renal cyst.  Plan to refer him to urology for evaluation of his renal cyst. Please send a copy of this note and the MRI report to the urology group here in town. Terms of his lumbar spine. He does have fairly severe stenosis it would require a decompression to 345 and possibly L3-4. He has a minimal listhesis of 4 5. Feel we could most likely decompress that with without effusion however I will contemplate that further may be a candidate for a Coflex.  Refer him to urology on call him with the further discussion. In the interim due to this severity of the radicular pain EHL weakness I feel he should not be driving therefore give him a note to be out of work. Any changes in the interim he is to call.  A long discussion via telephone conversation with Mr. Parish. I indicated that I spoke with Dr. Rolena Infante and we reviewed his studies. He does have severe stenosis at 4 L4-5 and L2-3. Slight offset but no instability in flexion extension. Has a fairly tall disc at 4-5 and he is mainly right leg pain and inability to walk up to 50-100 yards without severe pain. No change in bowel or bladder function. Do feel that a decompression and so to Dr. Rolena Infante at 4 5 with removal of the neural arch in order to address the stenosis. Lateral mass fusion with autologous and allograft bone graft would be appropriate. We had discussed the Coflex but the degree of decompression would preclude that. In addition we can decompress to 3 as well. Hopefully we can leave the neural arch of 2 and 3.  We discussed the risks and benefits including bleeding infection  persistent symptoms CSF leakage epidural fibrosis need for fusion in the future. No changes in symptoms worsening in symptoms. He is 6 weeks of disability short-term but not long-term. He does not have to lift heavy at work but has to travel long distances. We did discuss possible return to work in 6 weeks. We will have him scheduled accordingly.  Plan microlumbar decompression L2-3, L3-4, L4-5 with lateral mass fusion L4-5 with autograft and allograft bone graft  Cecilie Kicks., PA-C for Dr. Tonita Cong 04/23/2017, 10:51 AM

## 2017-04-30 NOTE — Anesthesia Preprocedure Evaluation (Signed)
Anesthesia Evaluation  Patient identified by MRN, date of birth, ID band Patient awake    Reviewed: Allergy & Precautions, H&P , NPO status , Patient's Chart, lab work & pertinent test results  Airway Mallampati: II   Neck ROM: full    Dental   Pulmonary sleep apnea ,    breath sounds clear to auscultation       Cardiovascular hypertension, + CAD and + Cardiac Stents   Rhythm:regular Rate:Normal     Neuro/Psych    GI/Hepatic   Endo/Other  obese  Renal/GU      Musculoskeletal   Abdominal   Peds  Hematology   Anesthesia Other Findings   Reproductive/Obstetrics                             Anesthesia Physical Anesthesia Plan  ASA: III  Anesthesia Plan: General   Post-op Pain Management:    Induction: Intravenous  PONV Risk Score and Plan: 2 and Ondansetron, Dexamethasone, Midazolam and Treatment may vary due to age or medical condition  Airway Management Planned: Oral ETT  Additional Equipment:   Intra-op Plan:   Post-operative Plan: Extubation in OR  Informed Consent: I have reviewed the patients History and Physical, chart, labs and discussed the procedure including the risks, benefits and alternatives for the proposed anesthesia with the patient or authorized representative who has indicated his/her understanding and acceptance.     Plan Discussed with: CRNA, Anesthesiologist and Surgeon  Anesthesia Plan Comments:         Anesthesia Quick Evaluation

## 2017-05-01 LAB — CBC
HCT: 39.4 % (ref 39.0–52.0)
Hemoglobin: 13.2 g/dL (ref 13.0–17.0)
MCH: 30.9 pg (ref 26.0–34.0)
MCHC: 33.5 g/dL (ref 30.0–36.0)
MCV: 92.3 fL (ref 78.0–100.0)
Platelets: 213 10*3/uL (ref 150–400)
RBC: 4.27 MIL/uL (ref 4.22–5.81)
RDW: 13.6 % (ref 11.5–15.5)
WBC: 12.7 10*3/uL — ABNORMAL HIGH (ref 4.0–10.5)

## 2017-05-01 LAB — BASIC METABOLIC PANEL
Anion gap: 7 (ref 5–15)
BUN: 14 mg/dL (ref 6–20)
CO2: 25 mmol/L (ref 22–32)
Calcium: 8.1 mg/dL — ABNORMAL LOW (ref 8.9–10.3)
Chloride: 106 mmol/L (ref 101–111)
Creatinine, Ser: 0.87 mg/dL (ref 0.61–1.24)
GFR calc Af Amer: >60 mL/min (ref >60)
GFR calc non Af Amer: >60 mL/min (ref >60)
Glucose, Bld: 138 mg/dL — ABNORMAL HIGH (ref 65–99)
Potassium: 4.2 mmol/L (ref 3.5–5.1)
Sodium: 138 mmol/L (ref 135–145)

## 2017-05-01 MED ORDER — ASPIRIN 81 MG PO TABS: 81.0000 mg | ORAL_TABLET | Freq: Every day | ORAL | Status: DC

## 2017-05-01 NOTE — Progress Notes (Signed)
Subjective: 1 Day Post-Op Procedure(s) (LRB): Microlumbar decompression L4-5, L3-4, L2-3, lateral mass fusion L4-5 with autograft/allograft bone (N/A) Patient reports pain as 3 on 0-10 scale.   Legs much better  Objective: Vital signs in last 24 hours: Temp:  [97.5 F (36.4 C)-99 F (37.2 C)] 98.1 F (36.7 C) (11/01 0529) Pulse Rate:  [56-99] 56 (11/01 0529) Resp:  [10-19] 15 (11/01 0529) BP: (122-174)/(70-105) 126/70 (11/01 0529) SpO2:  [92 %-100 %] 98 % (11/01 0529) Weight:  [103.9 kg (229 lb)] 103.9 kg (229 lb) (10/31 1517)  Intake/Output from previous day: 10/31 0701 - 11/01 0700 In: 4798.3 [P.O.:625; I.V.:3618.3; IV Piggyback:555] Out: 2440 [Urine:4025; Blood:400] Intake/Output this shift: No intake/output data recorded.   Recent Labs  05/01/17 0514  HGB 13.2    Recent Labs  05/01/17 0514  WBC 12.7*  RBC 4.27  HCT 39.4  PLT 213    Recent Labs  05/01/17 0514  NA 138  K 4.2  CL 106  CO2 25  BUN 14  CREATININE 0.87  GLUCOSE 138*  CALCIUM 8.1*   No results for input(s): LABPT, INR in the last 72 hours.  Neurologically intact Neurovascular intact Sensation intact distally Dorsiflexion/Plantar flexion intact Incision: scant drainage  Assessment/Plan: 1 Day Post-Op Procedure(s) (LRB): Microlumbar decompression L4-5, L3-4, L2-3, lateral mass fusion L4-5 with autograft/allograft bone (N/A) Advance diet Up with therapy D/C IV fluids  OOB  D/C foley. Discussed OR. Probable D/C tomorrow  Ellawyn Wogan C 05/01/2017, 7:14 AM

## 2017-05-01 NOTE — Op Note (Signed)
NAME:  John Berry, John Berry                 ACCOUNT NO.:  MEDICAL RECORD NO.:  [id number]  LOCATION:                                 FACILITY:  PHYSICIAN:  Susa Day, M.D.         DATE OF BIRTH:  DATE OF PROCEDURE:  04/30/2017 DATE OF DISCHARGE:                              OPERATIVE REPORT   PREOPERATIVE DIAGNOSIS:  Spinal stenosis, L2-3, L3-4, L4-5 with a degenerative listhesis at L4-5.  POSTOPERATIVE DIAGNOSIS:  Spinal stenosis, L2-3, L3-4, L4-5 with a degenerative listhesis at L4-5.  PROCEDURE: 1. Microlumbar decompression, L2-3, L3-4, L4-5. 2. Bilateral foraminotomies L4, L5, L3. 3. Lateral mass fusion L4-5 utilizing allograft and autologous bone.  ANESTHESIA:  General.  ASSISTANT:  Cleophas Dunker, PA.  HISTORY:  A 66 year old male with neurogenic claudication secondary to severe spinal stenosis noted at L4-5 and L2-3, L3-4 demonstrated moderate stenosis.  He had predominantly right lower extremity radicular pain L5 nerve root distribution and also L4 nerve root distribution with claudication symptoms and EHL and quadriceps weakness.  He has been refractory to conservative treatment.  Given the severity, we discussed decompression predominantly at L4-5 by removal of the neural arch.  He was not moving in flexion and extension.  He had a 1 mm offset.  He had severe facet hypertrophy at multiple levels.  History of decompression L5-S1 and disk degeneration.  L2-3 was stenotic as well.  We discussed decompressing L3-4, leaving the lamina of L3, and a decompression at L2- 3.  Risks and benefits discussed including bleeding, infection, damage to neurovascular structures, no changes in symptoms, worsening symptoms, DVT, PE, anesthetic complications, etc.  TECHNIQUE:  Placed in the supine position after induction of adequate general anesthesia, 2 g Kefzol, placed prone on the Burney frame.  All bony prominences were well padded.  Lumbar region was prepped and draped in  usual sterile fashion.  Two 18-gauge spinal needles were utilized to localize the L2-3, L3-4, and L4-5 interspaces confirmed with x-ray. Marcaine 0.25% with epinephrine was infiltrated in the subcutaneous tissues after sterile prep and drape.  We made an incision from the spinous process to just down to L5.  Subcutaneous tissue was dissected. Electrocautery was utilized to achieve hemostasis.  Marcaine 0.25% with epinephrine was infiltrated in the paraspinous musculature at all levels.  The dorsolumbar fascia was identified and divided in line with the skin incision.  The paraspinous musculature was elevated from lamina of L2-3, L3-4, and L4-5.  McCullough retractor was placed.  Confirmatory radiograph obtained with a Kocher on the spinous processes of L4 and L2. We skeletonized the spinous processes, removed the spinous processes of L3, L4, partially of L5 and L2 and saved it for further bone graft.  Operating microscope was draped and brought on the surgical field.  The patient began our decompression at the L4-5.  Hemilaminotomies of the caudad edge of L4 were performed with a 2 and then a 3 mm Kerrison.  The patient had very osteosclerotic bone.  We also used a Land. We continued our decompression cephalad to detach the ligamentum flavum. In addition, ligamentum flavum was detached from the cephalad edge of L5 with a curette.  Severe hypertrophic facets and ligamentum flavum were noted bilaterally.  Performed hemilaminotomies of L5 bilaterally from both sides of the operating room table.  We decompressed the lateral recess to the medial border of the pedicle, performed partial medial hemi facetectomies, undercutting the facets with less than 10% was removed.  Severe stenosis was noted bilaterally and remnants of epidural injections.  Continued cephalad and this required removal of the neural arch of L4.  Again, neural elements were protected at all times with neural  patties.  The bipolar electrocautery was utilized to achieve hemostasis as well as bone wax and thrombin-soaked Gelfoam.  He had some minor bleeding throughout.  This was controlled with blood pressure optimization.  The foramen bilaterally were decompressed.  He had hypertrophic ligamentum as well as osteophytic spurs with into the foramen.  We used a micro 2-0 FA curette to meticulously develop a plane between the osteophytes, the ligamentum flavum, and the thecal sac. After continuing cephalad and removal of the neural arch, hypertrophic ligamentum was noted at the space at L3-4, ligamentum flavum removed from L3-4, and hemilaminotomies of L3 were performed bilaterally. Decompressing the lateral recesses to the medial border of the pedicle. No disk herniations were noted at L3-4 or at L4-5.  Again, fairly significant sclerotic bone was encountered.  I then left the neural arch of L3 and did a central decompression at L2-3 removing the partial spinous process of L2.  I performed hemilaminotomies of the caudad edge of L2 bilaterally with a 2 and then a 3 mm Kerrison as well as the removing a portion of the lamina with a Leksell rongeur.  There was shingling of the lamina of L2 and L3.  We were able to skeletonize the superior aspect of L3.  There was some ossification of the ligamentum posteriorly.  We were able to identify the cephalad edge of L3 as well. After detaching ligamentum flavum from the caudad edge of L2, I then detached the ligamentum flavum from the cephalad edge of L3, and with a 2-mm Kerrison,, performed hemilaminotomies bilaterally.  Epidural fat was encountered.  Using a Penfield form from both sides of the operating room table, we decompressed the lateral recesses at L2-3 and noted was good restoration of the thecal sac.  There was interestingly posteriorly a small 2-mm x 4-mm herniation of the central portion of the sac presumably through the region of the severe  stenosis that was encountered.  Good restoration of thecal sac was noted.  Foraminotomies of L3 were performed.  Retaining the neural arch of L3 for stability purposes.  Again, bone wax and thrombin-soaked Gelfoam were placed in the laminotomy defects and a neural probe passed freely up the foramen L3-2 bilaterally beneath the neural arch of L3 and foramen of L4 and L5 bilaterally.  Confirmatory radiograph obtained, demonstrating decompression at L2-3 and then at L4-5, and we felt there was excellent decompression.  Revisited the decompression at L4-5 in the lateral recess.  Again, a neuro probe passed freely at the foramen of L4, L5, and L3 bilaterally.  No evidence of CSF leakage or active bleeding was noted, copiously irrigated with antibiotic irrigation.  Previously identified on radiographs over the facet at L4-5, we developed a plane into the lateral gutter utilizing the electrocautery and a Cobb and identified the lateral aspect of the facet and the TPs of L4 and L5.  The lateral aspect of this was decorticated with a high- speed bur.  Following this, we utilized a mixture of the patient's  cancellous bone from the spinous processes as well as allograft bone. This was reconstituted with blood noted from the patient's procedure. This was performed bilaterally at L4-5, and this was deposited and impacted into the lateral gutter extending along the lateral aspect of the pars and the facet to the TPs bilaterally.  Any redundant bone was removed.  Again, copious irrigation was noted.  No evidence of CSF leakage or active bleeding was noted.  Again, this was done bilaterally. No active bleeding was noted.  Thrombin-soaked Gelfoam was placed in laminotomy defect.  We removed the [facility] retractors.  Paraspinous muscles were copiously irrigated.  Throughout the case, the McCullough retractor was relaxed periodically and irrigated.  We closed the dorsolumbar fascia with #1 Vicryl  interrupted figure-of-eight suture with a small aperture in the distal part of the wound, subcu with 2-0, and skin with staples.  The patient had general oozing throughout the case and with again controlling the patient's blood pressure utilizing thrombin-soaked Gelfoam, bone wax, and electrocautery.  After closure with staples and a sterile dressing applied, he was placed supine on the hospital bed, extubated without difficulty and transported to the recovery room in satisfactory condition.  The patient tolerated the procedure well.  No complications.  BLOOD LOSS:  100 mL.     Susa Day, M.D.     Geralynn Rile  D:  04/30/2017  T:  04/30/2017  Job:  [id number]

## 2017-05-01 NOTE — Progress Notes (Signed)
Physical Therapy Treatment Patient Details Name: John Berry MRN: [id number] DOB: 1951/03/08 Today's Date: 05/01/2017    History of Present Illness Pt s/p L2-3, L3-4, L4-5 micro-lumbar decompression with L4-5 lateral mass fusion.    PT Comments    Pt motivated and progressing very well with mobility.  Reviewed stairs and back precautions.  Pt looking forward to dc home.   Follow Up Recommendations  No PT follow up     Equipment Recommendations  Other (comment)    Recommendations for Other Services OT consult     Precautions / Restrictions Precautions Precautions: Back Precaution Comments: back precautions reviewed x 2 Restrictions Weight Bearing Restrictions: No    Mobility  Bed Mobility Overal bed mobility: Needs Assistance Bed Mobility: Rolling;Sidelying to Sit;Sit to Sidelying Rolling: Min guard;Supervision Sidelying to sit: Min guard;Supervision     Sit to sidelying: Min guard;Supervision General bed mobility comments: cues for technique. Pt tends to move quickly and was combining movements  Transfers Overall transfer level: Needs assistance Equipment used: None Transfers: Sit to/from Stand Sit to Stand: Supervision         General transfer comment: cues for back precautions  Ambulation/Gait Ambulation/Gait assistance: Min guard;Supervision Ambulation Distance (Feet): 300 Feet Assistive device: Rolling walker (2 wheeled);1 person hand held assist Gait Pattern/deviations: Step-through pattern;Decreased step length - right;Decreased step length - left;Shuffle;Trunk flexed;Antalgic Gait velocity: decr Gait velocity interpretation: Below normal speed for age/gender General Gait Details: 150' with RW and 150' sans assistive device.  Pt with good stability and safety awareness.  Pt states he can borrow RW if needed .  Cues for posture and position from RW   Stairs Stairs: Yes   Stair Management: One rail Left;Step to pattern;Forwards Number of  Stairs: 4 General stair comments: cues for sequence  Wheelchair Mobility    Modified Rankin (Stroke Patients Only)       Balance Overall balance assessment: Needs assistance Sitting-balance support: Feet supported;No upper extremity supported Sitting balance-Leahy Scale: Good     Standing balance support: No upper extremity supported Standing balance-Leahy Scale: Good                              Cognition Arousal/Alertness: Awake/alert Behavior During Therapy: WFL for tasks assessed/performed Overall Cognitive Status: Within Functional Limits for tasks assessed                                        Exercises      General Comments        Pertinent Vitals/Pain Pain Assessment: 0-10 Pain Score: 4  Pain Location: back Pain Descriptors / Indicators: Aching;Sore Pain Intervention(s): Limited activity within patient's tolerance;Monitored during session;Premedicated before session    Home Living Family/patient expects to be discharged to:: Private residence Living Arrangements: Spouse/significant other Available Help at Discharge: Family                Prior Function Level of Independence: Independent          PT Goals (current goals can now be found in the care plan section) Acute Rehab PT Goals Patient Stated Goal: Regain IND and no more leg pain/numbness PT Goal Formulation: With patient Time For Goal Achievement: 05/03/17 Potential to Achieve Goals: Good Progress towards PT goals: Progressing toward goals    Frequency    7X/week      PT  Plan Current plan remains appropriate    Co-evaluation              AM-PAC PT "6 Clicks" Daily Activity  Outcome Measure  Difficulty turning over in bed (including adjusting bedclothes, sheets and blankets)?: A Little Difficulty moving from lying on back to sitting on the side of the bed? : A Little Difficulty sitting down on and standing up from a chair with arms (e.g.,  wheelchair, bedside commode, etc,.)?: A Little Help needed moving to and from a bed to chair (including a wheelchair)?: A Little Help needed walking in hospital room?: A Little Help needed climbing 3-5 steps with a railing? : A Little 6 Click Score: 18    End of Session   Activity Tolerance: Patient tolerated treatment well Patient left: in chair;with call bell/phone within reach;with family/visitor present Nurse Communication: Mobility status PT Visit Diagnosis: Difficulty in walking, not elsewhere classified (R26.2)     Time: 2119-4174 PT Time Calculation (min) (ACUTE ONLY): 22 min  Charges:  $Gait Training: 8-22 mins                    G Codes:       YC [phone]    [name] 05/01/2017, 10:50 AM

## 2017-05-01 NOTE — Progress Notes (Signed)
    Durable Medical Equipment        Start     Ordered   05/01/17 1003  For home use only DME Bedside commode  Once    Question:  Patient needs a bedside commode to treat with the following condition  Answer:  Surgery follow-up   05/01/17 1003    [phone]

## 2017-05-01 NOTE — Evaluation (Signed)
Occupational Therapy Evaluation Patient Details Name: John Berry MRN: [id number] DOB: 1950/08/15 Today's Date: 05/01/2017    History of Present Illness Pt s/p L2-3, L3-4, L4-5 micro-lumbar decompression with L4-5 lateral mass fusion.   Clinical Impression   This 66 year old man was admitted for the above sx.  All education was completed.  Pt verbalizes understanding of precautions    Follow Up Recommendations  No OT follow up;Supervision/Assistance - 24 hour    Equipment Recommendations  3 in 1 bedside commode    Recommendations for Other Services       Precautions / Restrictions Precautions Precautions: Back Precaution Comments: back precautions reviewed  Restrictions Weight Bearing Restrictions: No      Mobility Bed Mobility     Rolling: Min guard Sidelying to sit: Supervision     Sit to sidelying: Supervision General bed mobility comments: cues for technique. Pt tends to move quickly and was combining movements  Transfers   Equipment used: None   Sit to Stand: Supervision         General transfer comment: cues for back precautions    Balance                                           ADL either performed or assessed with clinical judgement   ADL Overall ADL's : Needs assistance/impaired Eating/Feeding: Independent   Grooming: Supervision/safety;Standing   Upper Body Bathing: Set up;Sitting   Lower Body Bathing: Moderate assistance;Sit to/from stand   Upper Body Dressing : Set up;Sitting   Lower Body Dressing: Moderate assistance;Sit to/from stand   Toilet Transfer: Min guard;Ambulation;BSC;Comfort height toilet   Toileting- Clothing Manipulation and Hygiene: Minimal assistance;Sit to/from stand         General ADL Comments: reviewed back precautions and adls.  Demonstrated side stepping over tub. Educated on AE. Pt plans to have wife assist.  Educated on toilet aide options. Reinforced log rolling technique.  Performed toilet transfers with and without 3:1.  He is more compliant with precautions with 3:1 and will get this prior to d/c     Vision         Perception     Praxis      Pertinent Vitals/Pain Pain Score: 4  Pain Location: back Pain Descriptors / Indicators: Aching;Sore Pain Intervention(s): Limited activity within patient's tolerance;Monitored during session;Premedicated before session;Repositioned     Hand Dominance     Extremity/Trunk Assessment Upper Extremity Assessment Upper Extremity Assessment: Overall WFL for tasks assessed           Communication Communication Communication: No difficulties   Cognition Arousal/Alertness: Awake/alert Behavior During Therapy: WFL for tasks assessed/performed Overall Cognitive Status: Within Functional Limits for tasks assessed                                     General Comments       Exercises     Shoulder Instructions      Home Living Family/patient expects to be discharged to:: Private residence Living Arrangements: Spouse/significant other Available Help at Discharge: Family               Bathroom Shower/Tub: Teaching laboratory technician Toilet: Handicapped height (in Producer, television/film/video)                Prior  Functioning/Environment Level of Independence: Independent                 OT Problem List:        OT Treatment/Interventions:      OT Goals(Current goals can be found in the care plan section) Acute Rehab OT Goals Patient Stated Goal: Regain IND and no more leg pain/numbness OT Goal Formulation: All assessment and education complete, DC therapy  OT Frequency:     Barriers to D/C:            Co-evaluation              AM-PAC PT "6 Clicks" Daily Activity     Outcome Measure Help from another person eating meals?: None Help from another person taking care of personal grooming?: A Little Help from another person toileting, which includes using toliet, bedpan,  or urinal?: A Little Help from another person bathing (including washing, rinsing, drying)?: A Lot Help from another person to put on and taking off regular upper body clothing?: A Little Help from another person to put on and taking off regular lower body clothing?: A Lot 6 Click Score: 17   End of Session    Activity Tolerance: Patient tolerated treatment well Patient left: in bed;with call bell/phone within reach  OT Visit Diagnosis: Muscle weakness (generalized) (M62.81)                Time: 3532-9924 OT Time Calculation (min): 23 min Charges:  OT General Charges $OT Visit: 1 Visit OT Evaluation $OT Eval Low Complexity: 1 Low G-Codes:     [place], OTR/L [phone] 05/01/2017  [name] 05/01/2017, 10:11 AM

## 2017-05-02 MED ORDER — MAGNESIUM CITRATE PO SOLN: 1.0000 | Freq: Once | ORAL | Status: DC

## 2017-05-02 MED ORDER — FLEET ENEMA 7-19 GM/118ML RE ENEM
1.0000 | ENEMA | Freq: Once | RECTAL | Status: AC
  Administered 2017-05-02: 1 via RECTAL

## 2017-05-02 MED ORDER — BISACODYL 10 MG RE SUPP
10.0000 mg | Freq: Once | RECTAL | Status: AC
  Administered 2017-05-02: 10 mg via RECTAL
  Filled 2017-05-02: qty 1

## 2017-05-02 MED ORDER — MAGNESIUM CITRATE PO SOLN
0.5000 | Freq: Once | ORAL | Status: AC
  Administered 2017-05-02: 0.5 via ORAL
  Filled 2017-05-02: qty 296

## 2017-05-02 NOTE — Progress Notes (Signed)
Patient reports having one small loose bm after suppository administration. Discussed with Dr Reather Littler PA Kennyth Lose. Patient okay to d/c home after enema as long as patient denies feeling severe discomfort or bloating. Patient agrees. Will administer and monitor.

## 2017-05-02 NOTE — Progress Notes (Signed)
Subjective: 2 Days Post-Op Procedure(s) (LRB): Microlumbar decompression L4-5, L3-4, L2-3, lateral mass fusion L4-5 with autograft/allograft bone (N/A) Patient reports pain as moderate.  Reports back pain, buttock/lateral hip pain but no other leg pain. Oxy seems to be working well. No BM yet, a little abdominal pressure but is passing flatus. Voiding without difficulty. No other c/o. Feels ready to go home today.  Objective: Vital signs in last 24 hours: Temp:  [97.9 F (36.6 C)-101 F (38.3 C)] 98.7 F (37.1 C) (11/02 0500) Pulse Rate:  [60-77] 72 (11/02 0500) Resp:  [15-18] 18 (11/02 0500) BP: (117-142)/(72-85) 117/72 (11/02 0500) SpO2:  [94 %-99 %] 94 % (11/02 0500)  Intake/Output from previous day: 11/01 0701 - 11/02 0700 In: 1080 [P.O.:1080] Out: 1350 [Urine:1350] Intake/Output this shift: Total I/O In: 120 [P.O.:120] Out: -    Recent Labs  05/01/17 0514  HGB 13.2    Recent Labs  05/01/17 0514  WBC 12.7*  RBC 4.27  HCT 39.4  PLT 213    Recent Labs  05/01/17 0514  NA 138  K 4.2  CL 106  CO2 25  BUN 14  CREATININE 0.87  GLUCOSE 138*  CALCIUM 8.1*   No results for input(s): LABPT, INR in the last 72 hours.  Neurologically intact ABD soft Neurovascular intact Sensation intact distally Intact pulses distally Dorsiflexion/Plantar flexion intact Incision: dressing C/D/I and scant drainage No cellulitis present Compartment soft no calf pain or sign of DVT  Assessment/Plan: 2 Days Post-Op Procedure(s) (LRB): Microlumbar decompression L4-5, L3-4, L2-3, lateral mass fusion L4-5 with autograft/allograft bone (N/A) Advance diet Up with therapy D/C IV fluids  Mag citrate today Plan D/C later today as long as pain remains well controlled Dressing change prior to D/C with new aquacel Will discuss with Dr. Mliss Fritz, [place] Rolls. 05/02/2017, 8:34 AM

## 2017-05-02 NOTE — Progress Notes (Signed)
Patient discharged to home with family. Educated in depth about BM management, constipation treatment and to follow up with Dr Tonita Cong if no BM in 24 hours. Patient and wife verbalized understanding. Home with all belongings, instructions, prescriptions. Escorted to pov via w/c.

## 2017-05-08 NOTE — Discharge Summary (Signed)
Physician Discharge Summary   Patient ID: John Berry MRN: [id number] DOB/AGE: 1951/05/22 66 y.o.  Admit date: 04/30/2017 Discharge date: 05/02/2017  Primary Diagnosis:   Spinal Stenosis  Admission Diagnoses:  Past Medical History:  Diagnosis Date  . CAD (coronary artery disease)    2013- stent placed   . Cancer (HCC)    hx of skin cancer   . Diverticulosis   . History of heart artery stent   . Hyperlipidemia   . Hypertension   . OSA (obstructive sleep apnea)    surgery in 2011   . Personal history of colonic polyps - adenomas 08/13/2010   TUBULAR ADENOMAS (X4)  . Trigeminal neuralgia 4/11   Microvascular decompression   Discharge Diagnoses:   Principal Problem:   Spinal stenosis of lumbar region Active Problems:   Spinal stenosis at L4-L5 level  Procedure:  Procedure(s) (LRB): Microlumbar decompression L4-5, L3-4, L2-3, lateral mass fusion L4-5 with autograft/allograft bone (N/A)   Consults: None  HPI:  see H&P    Laboratory Data: Hospital Outpatient Visit on 04/29/2017  Component Date Value Ref Range Status  . ABO/RH(D) 04/29/2017 O POS   Final  . Antibody Screen 04/29/2017 NEG   Final  . Sample Expiration 04/29/2017 05/03/2017   Final  . Extend sample reason 04/29/2017 NO TRANSFUSIONS OR PREGNANCY IN THE PAST 3 MONTHS   Final  . MRSA, PCR 04/29/2017 NEGATIVE  NEGATIVE Final  . Staphylococcus aureus 04/29/2017 NEGATIVE  NEGATIVE Final   Comment: (NOTE) The Xpert SA Assay (FDA approved for NASAL specimens in patients 73 years of age and older), is one component of a comprehensive surveillance program. It is not intended to diagnose infection nor to guide or monitor treatment.   . ABO/RH(D) 04/29/2017 O POS   Final   No results for input(s): HGB in the last 72 hours. No results for input(s): WBC, RBC, HCT, PLT in the last 72 hours. No results for input(s): NA, K, CL, CO2, BUN, CREATININE, GLUCOSE, CALCIUM in the last 72 hours. No results for  input(s): LABPT, INR in the last 72 hours.  X-Rays:Dg Lumbar Spine 2-3 Views  Result Date: 04/29/2017 CLINICAL DATA:  Herniated disc. EXAM: LUMBAR SPINE - 2-3 VIEW COMPARISON:  None. FINDINGS: No fracture or spondylolisthesis is noted. Mild degenerative disc disease is noted at L1-2 and L3-4 with anterior osteophyte formation. Mild degenerative disc disease is also noted at L4-5 and L5-S1. Rounded density is seen in the left upper quadrant which may be of renal etiology. IMPRESSION: Multilevel degenerative disc disease. No acute abnormality seen in the lumbar spine. Rounded density is seen in the left upper quadrant concerning for possible renal mass or cyst. CT or ultrasound is recommended for further evaluation. These results will be called to the ordering clinician or representative by the Radiologist Assistant, and communication documented in the PACS or zVision Dashboard. Electronically Signed   By: Marijo Conception, M.D.   On: 04/29/2017 10:02   Dg Spine Portable 1 View  Result Date: 04/30/2017 CLINICAL DATA:  Intraoperative localization for spine surgery. EXAM: PORTABLE SPINE - 1 VIEW COMPARISON:  Lumbar radiographs 04/29/2017 FINDINGS: Lateral lumbar spine film from the operating room demonstrates 3 surgical instruments marking L2-3, L4 and L5. IMPRESSION: L2-3, L4 and L5 marked intraoperatively. Electronically Signed   By: Marijo Sanes M.D.   On: 04/30/2017 12:02   Dg Spine Portable 1 View  Result Date: 04/30/2017 CLINICAL DATA:  66 year old male undergoing lumbar spine surgery. EXAM: PORTABLE SPINE - 1  VIEW COMPARISON:  Lumbar radiographs 04/29/2017. FINDINGS: Normal lumbar segmentation demonstrated on the comparison. Intraoperative portable cross-table lateral view of the lumbar spine labeled #2 at 0739 hours. Retractors in place at the L3-L4 and L4-L5 levels. Surgical clamps on the L3 and L4 spinous processes. Cephalad Surgical probe directed toward the inferior L3 level overlying the L3  inferior articulating facet. Caudal surgical probe at L4 overlying the L4 inferior articulating facet. IMPRESSION: Intraoperative localization as above. Electronically Signed   By: Genevie Ann M.D.   On: 04/30/2017 09:08   Dg Spine Portable 1 View  Result Date: 04/30/2017 CLINICAL DATA:  Intraoperative localization EXAM: PORTABLE SPINE - 1 VIEW COMPARISON:  04/29/2017 FINDINGS: Posterior needles are noted directed at the L4-5 and L2-3 interspaces. Diffuse degenerative disc and facet disease throughout the lumbar spine. Slight anterolisthesis of L4 on L5, stable. IMPRESSION: Intraoperative localization as above. Electronically Signed   By: Rolm Baptise M.D.   On: 04/30/2017 08:34    EKG: Orders placed or performed in visit on 07/09/16  . EKG 12-Lead     Hospital Course: Patient was admitted to [facility] and taken to the OR and underwent the above state procedure without complications.  Patient tolerated the procedure well and was later transferred to the recovery room and then to the orthopaedic floor for postoperative care.  They were given PO and IV analgesics for pain control following their surgery.  They were given 24 hours of postoperative antibiotics.   PT was consulted postop to assist with mobility and transfers.  The patient was allowed to be WBAT with therapy and was taught back precautions. Discharge planning was consulted to help with postop disposition and equipment needs.  Patient had a good night on the evening of surgery and started to get up OOB with therapy on day one. Patient was seen in rounds daily. Pain precluded discharge on same day of surgery. He did experience post-op constipation, requiring mag citrate, suppository and enema prior to his D/C on POD 2. He did experience a small bowel movement and did not report any distension prior to D/C. Did report positive flatus. They were given discharge instructions and dressing directions.  They were instructed on when to follow  up in the office with Dr. Tonita Cong.   Diet: Regular diet Activity:WBAT; Lspine precautions Follow-up:in 10-14 days Disposition - Home Discharged Condition: good   Discharge Instructions    Call MD / Call 911   Complete by:  As directed    If you experience chest pain or shortness of breath, CALL 911 and be transported to the hospital emergency room.  If you develope a fever above 101 F, pus (white drainage) or increased drainage or redness at the wound, or calf pain, call your surgeon's office.   Constipation Prevention   Complete by:  As directed    Drink plenty of fluids.  Prune juice may be helpful.  You may use a stool softener, such as Colace (over the counter) 100 mg twice a day.  Use MiraLax (over the counter) for constipation as needed.   Diet - low sodium heart healthy   Complete by:  As directed    Increase activity slowly as tolerated   Complete by:  As directed      Allergies as of 05/02/2017   No Known Allergies     Medication List    TAKE these medications   aspirin 81 MG tablet Take 1 tablet (81 mg total) by mouth daily. Resume 4  days post-op What changed:  additional instructions   docusate sodium 100 MG capsule Commonly known as:  COLACE Take 1 capsule (100 mg total) by mouth 2 (two) times daily as needed for mild constipation.   gabapentin 300 MG capsule Commonly known as:  NEURONTIN Take 300 mg by mouth 3 (three) times daily.   methocarbamol 500 MG tablet Commonly known as:  ROBAXIN Take 1 tablet (500 mg total) by mouth every 6 (six) hours as needed for muscle spasms.   metoprolol tartrate 25 MG tablet Commonly known as:  LOPRESSOR Take 0.5 tablets (12.5 mg total) by mouth 2 (two) times daily.   nitroGLYCERIN 0.4 MG SL tablet Commonly known as:  NITROSTAT Place 1 tablet (0.4 mg total) under the tongue every 5 (five) minutes as needed for chest pain.   oxyCODONE-acetaminophen 5-325 MG tablet Commonly known as:  PERCOCET Take 1-2 tablets by mouth  every 4 (four) hours as needed for severe pain.   polyethylene glycol packet Commonly known as:  MIRALAX / GLYCOLAX Take 17 g by mouth daily.   rosuvastatin 20 MG tablet Commonly known as:  CRESTOR Take 1 tablet (20 mg total) by mouth daily.      Follow-up Information    Susa Day, MD Follow up in 2 week(s).   Specialty:  Orthopedic Surgery Contact information: [address] [place] [address] [phone]           Signed: Lacie Draft PA-C Orthopaedic Surgery 05/08/2017, 9:50 AM

## 2017-05-26 ENCOUNTER — Other Ambulatory Visit (HOSPITAL_COMMUNITY): Payer: Self-pay | Admitting: Anesthesiology

## 2017-05-26 DIAGNOSIS — N281 Cyst of kidney, acquired: Secondary | ICD-10-CM

## 2017-06-09 ENCOUNTER — Ambulatory Visit (HOSPITAL_COMMUNITY)
Admission: RE | Admit: 2017-06-09 | Discharge: 2017-06-09 | Disposition: A | Discharge status: Home / Self Care | Payer: BLUE CROSS/BLUE SHIELD | Source: Ambulatory Visit | Attending: Anesthesiology | Admitting: Anesthesiology

## 2017-06-12 ENCOUNTER — Ambulatory Visit (HOSPITAL_COMMUNITY)
Admission: RE | Admit: 2017-06-12 | Discharge: 2017-06-12 | Disposition: A | Discharge status: Home / Self Care | Payer: BLUE CROSS/BLUE SHIELD | Source: Ambulatory Visit | Attending: Anesthesiology | Admitting: Anesthesiology

## 2017-06-12 DIAGNOSIS — N281 Cyst of kidney, acquired: Secondary | ICD-10-CM | POA: Diagnosis present

## 2017-06-12 DIAGNOSIS — K7689 Other specified diseases of liver: Secondary | ICD-10-CM | POA: Diagnosis not present

## 2017-06-12 MED ORDER — GADOBENATE DIMEGLUMINE 529 MG/ML IV SOLN
20.0000 mL | Freq: Once | INTRAVENOUS | Status: AC | PRN
  Administered 2017-06-12: 20 mL via INTRAVENOUS

## 2017-07-07 DIAGNOSIS — M545 Low back pain, unspecified: Secondary | ICD-10-CM | POA: Insufficient documentation

## 2017-07-07 DIAGNOSIS — G8929 Other chronic pain: Secondary | ICD-10-CM | POA: Insufficient documentation

## 2017-07-20 ENCOUNTER — Other Ambulatory Visit: Payer: Self-pay | Admitting: Cardiology

## 2017-07-20 DIAGNOSIS — E7800 Pure hypercholesterolemia, unspecified: Secondary | ICD-10-CM

## 2017-08-22 ENCOUNTER — Other Ambulatory Visit: Payer: Self-pay | Admitting: Cardiology

## 2017-08-22 DIAGNOSIS — E7800 Pure hypercholesterolemia, unspecified: Secondary | ICD-10-CM

## 2017-08-22 NOTE — Telephone Encounter (Signed)
REFILL 

## 2017-10-02 NOTE — Progress Notes (Signed)
HPI: FU coronary artery disease. Myoview in June of 2013 showed an ejection fraction of 51%. There was felt to be diaphragmatic attenuation and possible inferior ischemia. The patient underwent catheterization in June of 2013 that showed no disease in the left main. There was a 70% LAD, 80-90% septal, and a small diagonal with a 90% ostial lesion. There was no significant disease in the circumflex. There was an 80% mid right coronary artery and the PDA had a 70% mid lesion. The patient had PCI of the right coronary artery. He has been treated medically for his LAD disease. His ejection fraction is preserved. The patient did have an EP study because of unexplained syncope in June of 2013. There was no inducible ventricular tachycardia or supraventricular tachycardia. Exercise tolerance test in March 2016 negative. MRA of abd 12/18 showed no AAA. Since last seen, the patient denies any dyspnea on exertion, orthopnea, PND, pedal edema, palpitations, syncope or chest pain.   Current Outpatient Medications  Medication Sig Dispense Refill  . aspirin 81 MG tablet Take 1 tablet (81 mg total) by mouth daily. Resume 4 days post-op 30 tablet   . metoprolol tartrate (LOPRESSOR) 25 MG tablet Take 0.5 tablets (12.5 mg total) by mouth 2 (two) times daily. KEEP OV. 90 tablet 3  . nitroGLYCERIN (NITROSTAT) 0.4 MG SL tablet Place 1 tablet (0.4 mg total) under the tongue every 5 (five) minutes as needed for chest pain. 25 tablet 1  . rosuvastatin (CRESTOR) 20 MG tablet Take 1 tablet (20 mg total) by mouth daily. KEEP OV. 90 tablet 0   No current facility-administered medications for this visit.      Past Medical History:  Diagnosis Date  . CAD (coronary artery disease)    2013- stent placed   . Cancer (HCC)    hx of skin cancer   . Diverticulosis   . History of heart artery stent   . Hyperlipidemia   . Hypertension   . OSA (obstructive sleep apnea)    surgery in 2011   . Personal history of colonic  polyps - adenomas 08/13/2010   TUBULAR ADENOMAS (X4)  . Trigeminal neuralgia 4/11   Microvascular decompression    Past Surgical History:  Procedure Laterality Date  . ACHILLES TENDON REPAIR     x 3  . BACK SURGERY  2005  . BILATERAL CARPAL TUNNEL RELEASE    . COLONOSCOPY  08/13/2010  . CRANIOTOMY  10/25/2009   for trigeminal neuralgia  . ELECTROPHYSIOLOGY STUDY N/A 12/16/2011   Procedure: ELECTROPHYSIOLOGY STUDY;  Surgeon: Evans Lance, MD;  Location: [facility] CATH LAB;  Service: Cardiovascular;  Laterality: N/A;  . LEFT HEART CATHETERIZATION WITH CORONARY ANGIOGRAM N/A 12/13/2011   PTCA Procedure:  Surgeon: Hillary Bow, MD;  Location: [facility] CATH LAB;  Service: Cardiovascular;  Laterality: N/A;  . LEFT HEART CATHETERIZATION WITH CORONARY ANGIOGRAM N/A 12/16/2011   Procedure: LEFT HEART CATHETERIZATION WITH CORONARY ANGIOGRAM;  Surgeon: Thayer Headings, MD;  Location: [facility] CATH LAB;  Service: Cardiovascular;  Laterality: N/A;  . LUMBAR LAMINECTOMY/DECOMPRESSION MICRODISCECTOMY N/A 04/30/2017   Procedure: Microlumbar decompression L4-5, L3-4, L2-3, lateral mass fusion L4-5 with autograft/allograft bone;  Surgeon: Susa Day, MD;  Location: [facility] ORS;  Service: Orthopedics;  Laterality: N/A;  . NASAL SINUS SURGERY    . SPINE SURGERY     lumbar x 2  . uvla platoplasty      Social History   Socioeconomic History  . Marital status: Married  Spouse name: Jenny Reichmann  . Number of children: 3  . Years of education: Not on file  . Highest education level: Not on file  Occupational History  . Occupation: Librarian, academic: Air cabin crew  Social Needs  . Financial resource strain: Not on file  . Food insecurity:    Worry: Not on file    Inability: Not on file  . Transportation needs:    Medical: Not on file    Non-medical: Not on file  Tobacco Use  . Smoking status: Never Smoker  . Smokeless tobacco: Never Used  Substance and Sexual Activity  . Alcohol use: No  . Drug use: No    . Sexual activity: Yes  Lifestyle  . Physical activity:    Days per week: Not on file    Minutes per session: Not on file  . Stress: Not on file  Relationships  . Social connections:    Talks on phone: Not on file    Gets together: Not on file    Attends religious service: Not on file    Active member of club or organization: Not on file    Attends meetings of clubs or organizations: Not on file    Relationship status: Not on file  . Intimate partner violence:    Fear of current or ex partner: Not on file    Emotionally abused: Not on file    Physically abused: Not on file    Forced sexual activity: Not on file  Other Topics Concern  . Not on file  Social History Narrative   Lives in [place], [place] with wife.    Exercise: No    Family History  Problem Relation Age of Onset  . Hypertension Mother   . Heart disease Mother 80  . Hyperlipidemia Mother   . Pancreatic cancer Father   . Heart disease Brother 4  . Colon cancer Neg Hx   . Esophageal cancer Neg Hx   . Prostate cancer Neg Hx   . Rectal cancer Neg Hx   . Stomach cancer Neg Hx     ROS: Back pain but no fevers or chills, productive cough, hemoptysis, dysphasia, odynophagia, melena, hematochezia, dysuria, hematuria, rash, seizure activity, orthopnea, PND, pedal edema, claudication. Remaining systems are negative.  Physical Exam: Well-developed well-nourished in no acute distress.  Skin is warm and dry.  HEENT is normal.  Neck is supple.  Chest is clear to auscultation with normal expansion.  Cardiovascular exam is regular rate and rhythm.  Abdominal exam nontender or distended. No masses palpated. Extremities show no edema. neuro grossly intact  ECG-sinus bradycardia at a rate of 54.  No ST changes.  Personally reviewed  A/P  1 coronary artery disease-patient denies chest pain or dyspnea.  Continue medical therapy including aspirin and statin.  2 hyperlipidemia-continue statin.  Check lipids and  liver.  Kirk Ruths, MD

## 2017-10-08 ENCOUNTER — Encounter: Payer: Self-pay | Admitting: Cardiology

## 2017-10-08 ENCOUNTER — Ambulatory Visit: Payer: BLUE CROSS/BLUE SHIELD | Admitting: Cardiology

## 2017-10-08 VITALS — BP 128/82 | HR 54 | Ht 71.0 in | Wt 233.2 lb

## 2017-10-08 DIAGNOSIS — I2510 Atherosclerotic heart disease of native coronary artery without angina pectoris: Secondary | ICD-10-CM

## 2017-10-08 DIAGNOSIS — E7800 Pure hypercholesterolemia, unspecified: Secondary | ICD-10-CM | POA: Diagnosis not present

## 2017-10-08 NOTE — Patient Instructions (Signed)
Medication Instructions:   NO CHANGE  Labwork:  Your physician recommends that you return for lab work PRIOR TO EATING  Follow-Up:  Your physician wants you to follow-up in: ONE YEAR WITH DR CRENSHAW You will receive a reminder letter in the mail two months in advance. If you don't receive a letter, please call our office to schedule the follow-up appointment.   If you need a refill on your cardiac medications before your next appointment, please call your pharmacy.    

## 2017-10-14 ENCOUNTER — Encounter: Payer: Self-pay | Admitting: *Deleted

## 2017-10-14 LAB — HEPATIC FUNCTION PANEL
ALT: 19 IU/L (ref 0–44)
AST: 22 IU/L (ref 0–40)
Albumin: 4.4 g/dL (ref 3.6–4.8)
Alkaline Phosphatase: 41 IU/L (ref 39–117)
Bilirubin Total: 0.5 mg/dL (ref 0.0–1.2)
Bilirubin, Direct: 0.18 mg/dL (ref 0.00–0.40)
Total Protein: 6.8 g/dL (ref 6.0–8.5)

## 2017-10-14 LAB — LIPID PANEL
Chol/HDL Ratio: 2.5 ratio (ref 0.0–5.0)
Cholesterol, Total: 126 mg/dL (ref 100–199)
HDL: 50 mg/dL (ref >39)
LDL Calculated: 59 mg/dL (ref 0–99)
Triglycerides: 84 mg/dL (ref 0–149)
VLDL Cholesterol Cal: 17 mg/dL (ref 5–40)

## 2017-12-04 ENCOUNTER — Other Ambulatory Visit: Payer: Self-pay | Admitting: Cardiology

## 2017-12-04 DIAGNOSIS — E7800 Pure hypercholesterolemia, unspecified: Secondary | ICD-10-CM

## 2017-12-04 NOTE — Telephone Encounter (Signed)
Rx sent to pharmacy   

## 2018-03-03 ENCOUNTER — Other Ambulatory Visit: Payer: Self-pay | Admitting: Cardiology

## 2018-03-03 DIAGNOSIS — E7800 Pure hypercholesterolemia, unspecified: Secondary | ICD-10-CM

## 2018-03-03 NOTE — Telephone Encounter (Signed)
Rx request sent to pharmacy.  

## 2018-04-12 IMAGING — DX DG SPINE 1V PORT
1 series · 1 of 1 positions shown · non-contrast
Comparison: Lumbar radiographs 04/29/2017

CLINICAL DATA: Intraoperative localization for spine surgery.

EXAM:
PORTABLE SPINE - 1 VIEW

[l-spine lat]
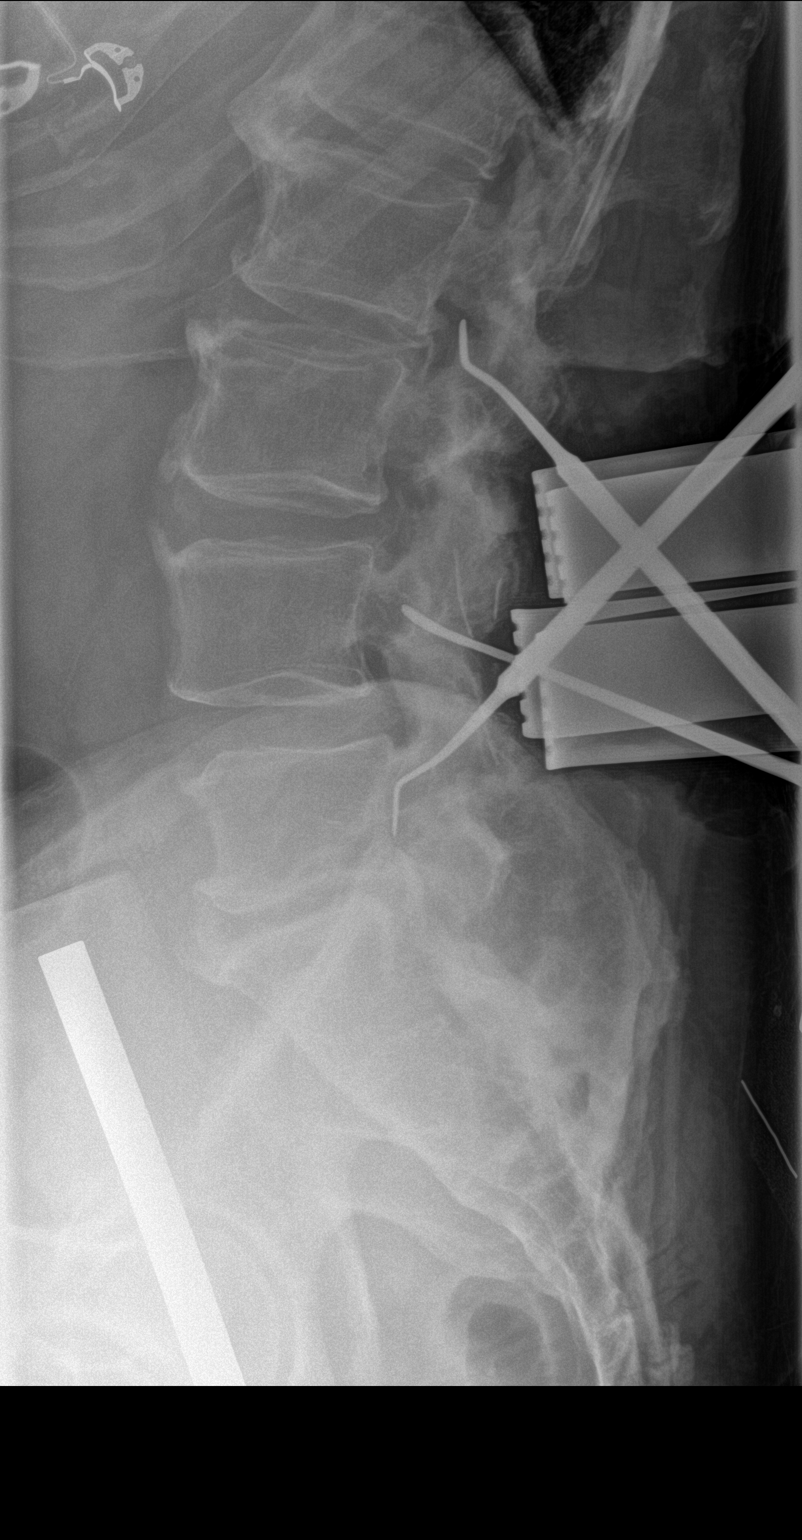

[1 of 1 positions shown; findings below may reference images not displayed]

FINDINGS: Lateral lumbar spine film from the operating room demonstrates 3
surgical instruments marking L2-3, L4 and L5.
IMPRESSION: L2-3, L4 and L5 marked intraoperatively.

## 2018-04-12 IMAGING — DX DG SPINE 1V PORT
1 series · 1 of 1 positions shown · non-contrast
Comparison: Lumbar radiographs 04/29/2017.

CLINICAL DATA: 65-year-old male undergoing lumbar spine surgery.

EXAM:
PORTABLE SPINE - 1 VIEW

[l-spine lat]
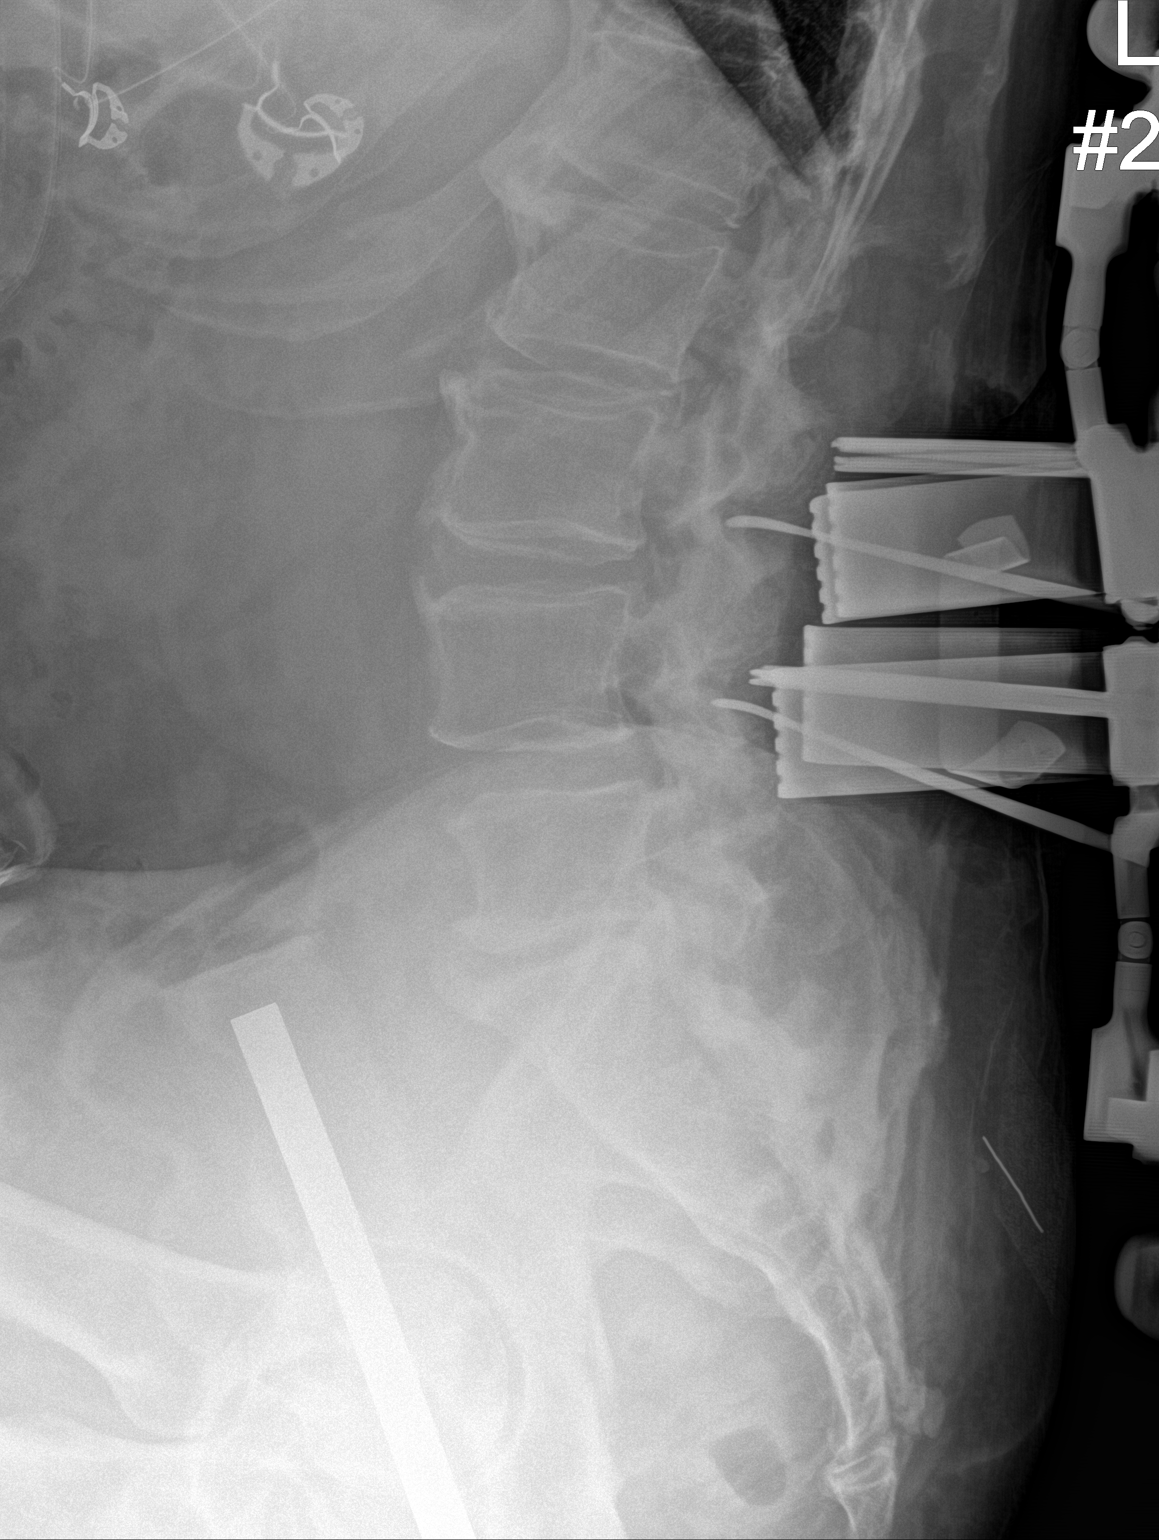

[1 of 1 positions shown; findings below may reference images not displayed]

FINDINGS: Normal lumbar segmentation demonstrated on the comparison.

Intraoperative portable cross-table lateral view of the lumbar spine
labeled #2 at 6018 hours.

Retractors in place at the L3-L4 and L4-L5 levels.

Surgical clamps on the L3 and L4 spinous processes.

Cephalad Surgical probe directed toward the inferior L3 level
overlying the L3 inferior articulating facet.

Caudal surgical probe at L4 overlying the L4 inferior articulating
facet.
IMPRESSION: Intraoperative localization as above.

## 2018-04-12 IMAGING — DX DG SPINE 1V PORT
1 series · 1 of 1 positions shown · non-contrast
Comparison: 04/29/2017

CLINICAL DATA: Intraoperative localization

EXAM:
PORTABLE SPINE - 1 VIEW

[l-spine lat]
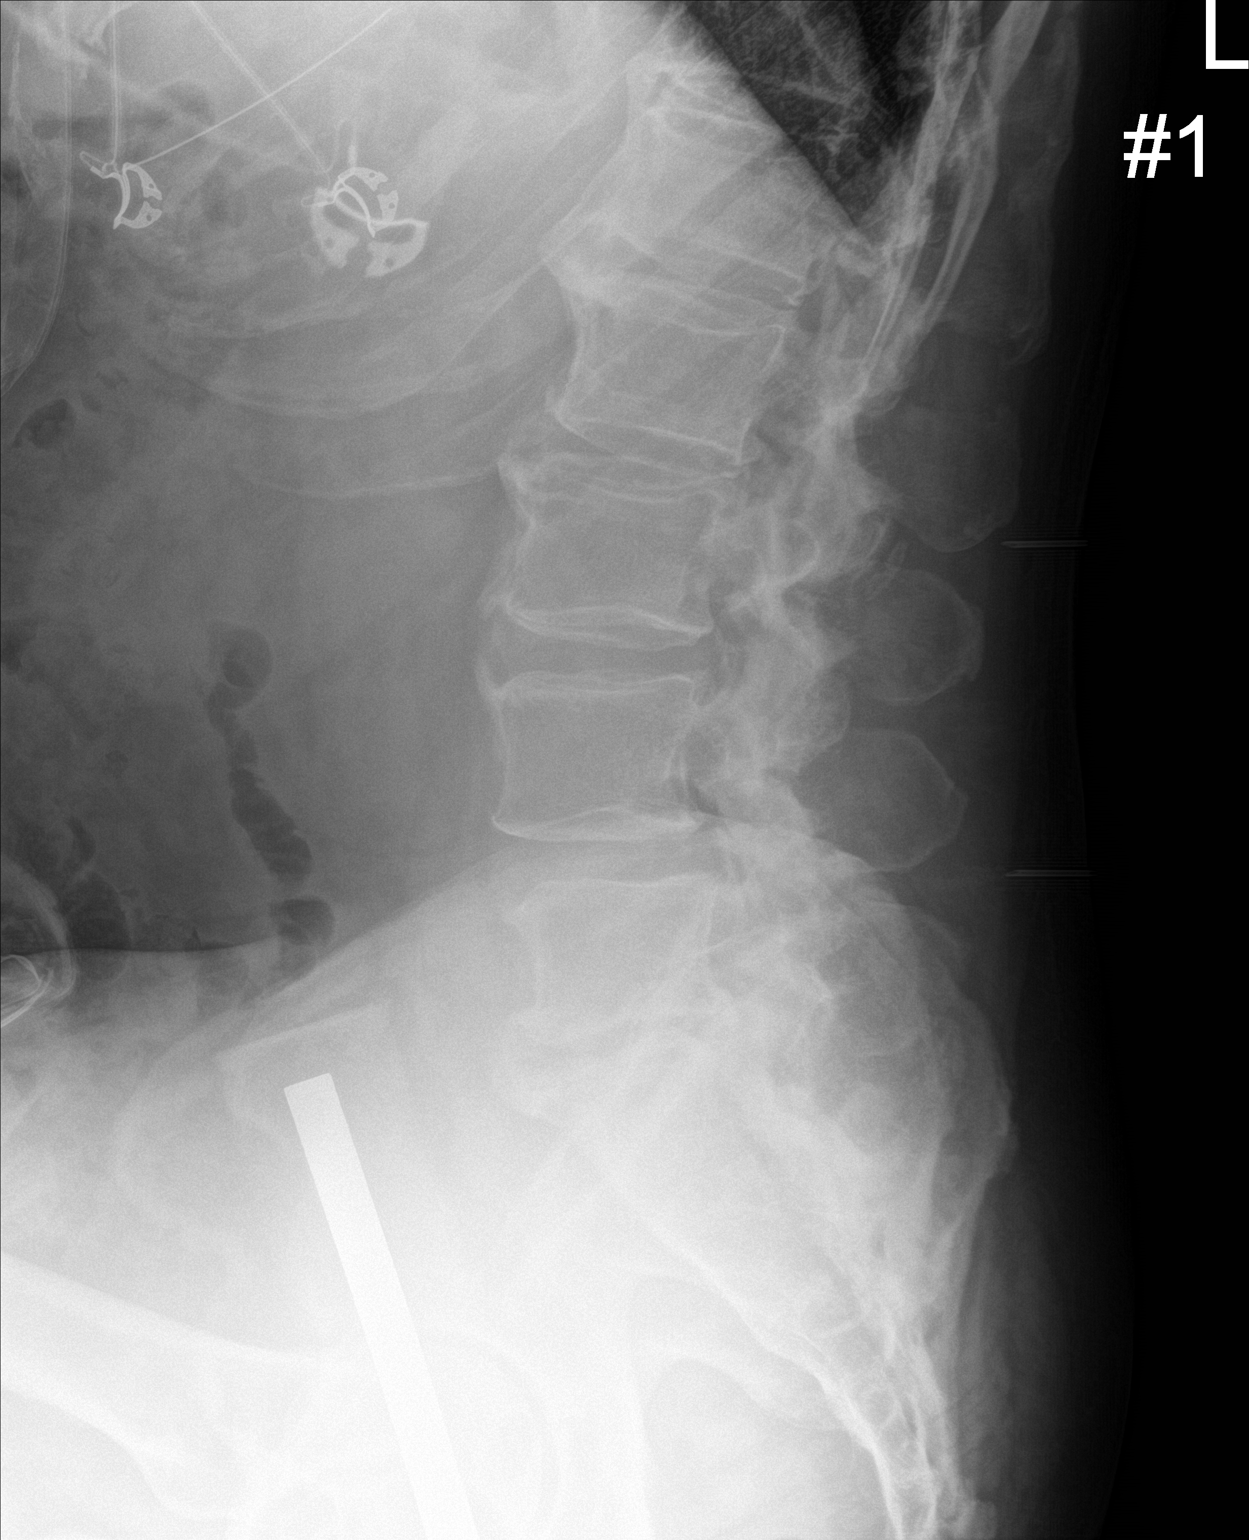

[1 of 1 positions shown; findings below may reference images not displayed]

FINDINGS: Posterior needles are noted directed at the L4-5 and L2-3
interspaces. Diffuse degenerative disc and facet disease throughout
the lumbar spine. Slight anterolisthesis of L4 on L5, stable.
IMPRESSION: Intraoperative localization as above.

## 2018-04-20 DIAGNOSIS — M5136 Other intervertebral disc degeneration, lumbar region: Secondary | ICD-10-CM | POA: Insufficient documentation

## 2018-06-16 ENCOUNTER — Other Ambulatory Visit: Payer: Self-pay | Admitting: Cardiology

## 2018-06-16 DIAGNOSIS — E7800 Pure hypercholesterolemia, unspecified: Secondary | ICD-10-CM

## 2018-09-15 ENCOUNTER — Other Ambulatory Visit: Payer: Self-pay | Admitting: Cardiology

## 2018-11-06 NOTE — Progress Notes (Signed)
Virtual Visit via Video Note   This visit type was conducted due to national recommendations for restrictions regarding the COVID-19 Pandemic (e.g. social distancing) in an effort to limit this patient's exposure and mitigate transmission in our community.  Due to his co-morbid illnesses, this patient is at least at moderate risk for complications without adequate follow up.  This format is felt to be most appropriate for this patient at this time.  All issues noted in this document were discussed and addressed.  A limited physical exam was performed with this format.  Please refer to the patient's chart for his consent to telehealth for [facility].   Date:  11/09/2018   ID:  John Berry, DOB 1950-09-06, MRN [id number]  Patient Location: Home Provider Location: Home  PCP:  Rolene Course, PA-C  Cardiologist:  Dr Stanford Breed  Evaluation Performed:  Follow-Up Visit  Chief Complaint:  FU CAD  History of Present Illness:    FU coronary artery disease. Myoview in June of 2013 showed an ejection fraction of 51%. There was felt to be diaphragmatic attenuation and possible inferior ischemia. The patient underwent catheterization in June of 2013 that showed no disease in the left main. There was a 70% LAD, 80-90% septal, and a small diagonal with a 90% ostial lesion. There was no significant disease in the circumflex. There was an 80% mid right coronary artery and the PDA had a 70% mid lesion. The patient had PCI of the right coronary artery. He has been treated medically for his LAD disease. His ejection fraction is preserved. The patient did have an EP study because of unexplained syncope in June of 2013. There was no inducible ventricular tachycardia or supraventricular tachycardia. Exercise tolerance test in March 2016 negative. MRA of abd 12/18 showed no AAA. Since last seen,the patient denies any dyspnea on exertion, orthopnea, PND, pedal edema, palpitations, syncope or chest pain.    The patient does not have symptoms concerning for COVID-19 infection (fever, chills, cough, or new shortness of breath).    Past Medical History:  Diagnosis Date  . CAD (coronary artery disease)    2013- stent placed   . Cancer (HCC)    hx of skin cancer   . Diverticulosis   . History of heart artery stent   . Hyperlipidemia   . Hypertension   . OSA (obstructive sleep apnea)    surgery in 2011   . Personal history of colonic polyps - adenomas 08/13/2010   TUBULAR ADENOMAS (X4)  . Trigeminal neuralgia 4/11   Microvascular decompression   Past Surgical History:  Procedure Laterality Date  . ACHILLES TENDON REPAIR     x 3  . BACK SURGERY  2005  . BILATERAL CARPAL TUNNEL RELEASE    . COLONOSCOPY  08/13/2010  . CRANIOTOMY  10/25/2009   for trigeminal neuralgia  . ELECTROPHYSIOLOGY STUDY N/A 12/16/2011   Procedure: ELECTROPHYSIOLOGY STUDY;  Surgeon: Evans Lance, MD;  Location: [facility] CATH LAB;  Service: Cardiovascular;  Laterality: N/A;  . LEFT HEART CATHETERIZATION WITH CORONARY ANGIOGRAM N/A 12/13/2011   PTCA Procedure:  Surgeon: Hillary Bow, MD;  Location: [facility] CATH LAB;  Service: Cardiovascular;  Laterality: N/A;  . LEFT HEART CATHETERIZATION WITH CORONARY ANGIOGRAM N/A 12/16/2011   Procedure: LEFT HEART CATHETERIZATION WITH CORONARY ANGIOGRAM;  Surgeon: Thayer Headings, MD;  Location: [facility] CATH LAB;  Service: Cardiovascular;  Laterality: N/A;  . LUMBAR LAMINECTOMY/DECOMPRESSION MICRODISCECTOMY N/A 04/30/2017   Procedure: Microlumbar decompression L4-5, L3-4, L2-3, lateral mass fusion  L4-5 with autograft/allograft bone;  Surgeon: Susa Day, MD;  Location: [facility] ORS;  Service: Orthopedics;  Laterality: N/A;  . NASAL SINUS SURGERY    . SPINE SURGERY     lumbar x 2  . uvla platoplasty       Current Meds  Medication Sig  . aspirin 81 MG tablet Take 1 tablet (81 mg total) by mouth daily. Resume 4 days post-op  . metoprolol tartrate (LOPRESSOR) 25 MG tablet TAKE 1/2 (ONE-HALF)  TABLET BY MOUTH TWICE DAILY  . nitroGLYCERIN (NITROSTAT) 0.4 MG SL tablet Place 1 tablet (0.4 mg total) under the tongue every 5 (five) minutes as needed for chest pain.  . rosuvastatin (CRESTOR) 20 MG tablet TAKE 1 TABLET BY MOUTH ONCE DAILY     Allergies:   Patient has no known allergies.   Social History   Tobacco Use  . Smoking status: Never Smoker  . Smokeless tobacco: Never Used  Substance Use Topics  . Alcohol use: No  . Drug use: No     Family Hx: The patient's family history includes Heart disease (age of onset: 77) in his brother and mother; Hyperlipidemia in his mother; Hypertension in his mother; Pancreatic cancer in his father. There is no history of Colon cancer, Esophageal cancer, Prostate cancer, Rectal cancer, or Stomach cancer.  ROS:   Please see the history of present illness.    No fevers, chills or productive cough. All other systems reviewed and are negative.   Recent Lipid Panel Lab Results  Component Value Date/Time   CHOL 126 10/13/2017 12:19 PM   TRIG 84 10/13/2017 12:19 PM   HDL 50 10/13/2017 12:19 PM   CHOLHDL 2.5 10/13/2017 12:19 PM   CHOLHDL 3.0 09/09/2016 08:43 AM   LDLCALC 59 10/13/2017 12:19 PM    Wt Readings from Last 3 Encounters:  11/09/18 225 lb (102.1 kg)  10/08/17 233 lb 3.2 oz (105.8 kg)  04/30/17 229 lb (103.9 kg)     Objective:    Vital Signs:  Ht 5\' 11"  (1.803 m)   Wt 225 lb (102.1 kg)   BMI 31.38 kg/m    VITAL SIGNS:  reviewed  No acute distress Answers questions appropriately Normal affect Remainder of physical examination not performed (telehealth visit; coronavirus pandemic)  ASSESSMENT & PLAN:    1. Coronary artery disease-patient doing well with no chest pain.  Plan to continue medical therapy with aspirin and statin. 2. Hyperlipidemia-continue statin. Check lipids and liver.  COVID-19 Education: The importance of social distancing was discussed today.  Time:   Today, I have spent 10 minutes with the  patient with telehealth technology discussing the above problems.     Medication Adjustments/Labs and Tests Ordered: Current medicines are reviewed at length with the patient today.  Concerns regarding medicines are outlined above.   Tests Ordered: No orders of the defined types were placed in this encounter.   Medication Changes: No orders of the defined types were placed in this encounter.   Disposition:  Follow up in 6 month(s)  Signed, Kirk Ruths, MD  11/09/2018 7:50 AM    [place]

## 2018-11-09 ENCOUNTER — Telehealth (INDEPENDENT_AMBULATORY_CARE_PROVIDER_SITE_OTHER): Payer: Medicare Other | Admitting: Cardiology

## 2018-11-09 VITALS — Ht 71.0 in | Wt 225.0 lb

## 2018-11-09 DIAGNOSIS — I2510 Atherosclerotic heart disease of native coronary artery without angina pectoris: Secondary | ICD-10-CM

## 2018-11-09 DIAGNOSIS — E7800 Pure hypercholesterolemia, unspecified: Secondary | ICD-10-CM

## 2018-11-09 NOTE — Patient Instructions (Signed)
Medication Instructions:  NO CHANGE If you need a refill on your cardiac medications before your next appointment, please call your pharmacy.   Lab work: Your physician recommends that you return for lab work PRIOR TO EATING If you have labs (blood work) drawn today and your tests are completely normal, you will receive your results only by: . MyChart Message (if you have MyChart) OR . A paper copy in the mail If you have any lab test that is abnormal or we need to change your treatment, we will call you to review the results.  Follow-Up: At [facility], you and your health needs are our priority.  As part of our continuing mission to provide you with exceptional heart care, we have created designated Provider Care Teams.  These Care Teams include your primary Cardiologist (physician) and Advanced Practice Providers (APPs -  Physician Assistants and Nurse Practitioners) who all work together to provide you with the care you need, when you need it. You will need a follow up appointment in 6 months.  Please call our office 2 months in advance to schedule this appointment.  You may see BRIAN CRENSHAW MD or one of the following Advanced Practice Providers on your designated Care Team:   Luke Kilroy, PA-C Krista Kroeger, PA-C . Callie Goodrich, PA-C     

## 2018-11-17 DIAGNOSIS — E7800 Pure hypercholesterolemia, unspecified: Secondary | ICD-10-CM | POA: Diagnosis not present

## 2018-11-17 LAB — HEPATIC FUNCTION PANEL
ALT: 20 IU/L (ref 0–44)
AST: 21 IU/L (ref 0–40)
Albumin: 4.3 g/dL (ref 3.8–4.8)
Alkaline Phosphatase: 40 IU/L (ref 39–117)
Bilirubin Total: 0.5 mg/dL (ref 0.0–1.2)
Bilirubin, Direct: 0.16 mg/dL (ref 0.00–0.40)
Total Protein: 6.8 g/dL (ref 6.0–8.5)

## 2018-11-17 LAB — LIPID PANEL
Chol/HDL Ratio: 2.9 ratio (ref 0.0–5.0)
Cholesterol, Total: 120 mg/dL (ref 100–199)
HDL: 42 mg/dL (ref >39)
LDL Calculated: 59 mg/dL (ref 0–99)
Triglycerides: 96 mg/dL (ref 0–149)
VLDL Cholesterol Cal: 19 mg/dL (ref 5–40)

## 2018-11-18 ENCOUNTER — Encounter: Payer: Self-pay | Admitting: *Deleted

## 2019-01-18 ENCOUNTER — Other Ambulatory Visit: Payer: Self-pay | Admitting: Cardiology

## 2019-01-21 ENCOUNTER — Other Ambulatory Visit: Payer: Self-pay

## 2019-01-21 MED ORDER — NITROGLYCERIN 0.4 MG SL SUBL: 0.4000 mg | SUBLINGUAL_TABLET | SUBLINGUAL | 1 refills | Status: DC | PRN

## 2019-04-19 DIAGNOSIS — Z955 Presence of coronary angioplasty implant and graft: Secondary | ICD-10-CM | POA: Diagnosis not present

## 2019-04-19 DIAGNOSIS — Z0000 Encounter for general adult medical examination without abnormal findings: Secondary | ICD-10-CM | POA: Diagnosis not present

## 2019-04-19 DIAGNOSIS — I2510 Atherosclerotic heart disease of native coronary artery without angina pectoris: Secondary | ICD-10-CM | POA: Diagnosis not present

## 2019-04-19 DIAGNOSIS — E7849 Other hyperlipidemia: Secondary | ICD-10-CM | POA: Diagnosis not present

## 2019-06-08 DIAGNOSIS — L821 Other seborrheic keratosis: Secondary | ICD-10-CM | POA: Diagnosis not present

## 2019-06-08 DIAGNOSIS — L570 Actinic keratosis: Secondary | ICD-10-CM | POA: Diagnosis not present

## 2019-06-08 DIAGNOSIS — Z85820 Personal history of malignant melanoma of skin: Secondary | ICD-10-CM | POA: Diagnosis not present

## 2019-06-08 DIAGNOSIS — D225 Melanocytic nevi of trunk: Secondary | ICD-10-CM | POA: Diagnosis not present

## 2019-06-11 NOTE — Progress Notes (Signed)
HPI: FU coronary artery disease. Myoview in June of 2013 showed an ejection fraction of 51%. There was felt to be diaphragmatic attenuation and possible inferior ischemia. The patient underwent catheterization in June of 2013 that showed no disease in the left main. There was a 70% LAD, 80-90% septal, and a small diagonal with a 90% ostial lesion. There was no significant disease in the circumflex. There was an 80% mid right coronary artery and the PDA had a 70% mid lesion. The patient had PCI of the right coronary artery. He has been treated medically for his LAD disease. His ejection fraction is preserved. The patient did have an EP study because of unexplained syncope in June of 2013. There was no inducible ventricular tachycardia or supraventricular tachycardia. Exercise tolerance test in March 2016 negative.MRA of abd 12/18 showed no AAA.Since last seen,the patient denies any dyspnea on exertion, orthopnea, PND, pedal edema, palpitations, syncope or chest pain.   Current Outpatient Medications  Medication Sig Dispense Refill  . aspirin 81 MG tablet Take 1 tablet (81 mg total) by mouth daily. Resume 4 days post-op 30 tablet   . metoprolol tartrate (LOPRESSOR) 25 MG tablet Take 1/2 (one-half) tablet by mouth twice daily 90 tablet 2  . nitroGLYCERIN (NITROSTAT) 0.4 MG SL tablet Place 1 tablet (0.4 mg total) under the tongue every 5 (five) minutes as needed for chest pain. 25 tablet 1  . rosuvastatin (CRESTOR) 20 MG tablet TAKE 1 TABLET BY MOUTH ONCE DAILY 90 tablet 1   No current facility-administered medications for this visit.     Past Medical History:  Diagnosis Date  . CAD (coronary artery disease)    2013- stent placed   . Cancer (HCC)    hx of skin cancer   . Diverticulosis   . History of heart artery stent   . Hyperlipidemia   . Hypertension   . OSA (obstructive sleep apnea)    surgery in 2011   . Personal history of colonic polyps - adenomas 08/13/2010   TUBULAR  ADENOMAS (X4)  . Trigeminal neuralgia 4/11   Microvascular decompression    Past Surgical History:  Procedure Laterality Date  . ACHILLES TENDON REPAIR     x 3  . BACK SURGERY  2005  . BILATERAL CARPAL TUNNEL RELEASE    . COLONOSCOPY  08/13/2010  . CRANIOTOMY  10/25/2009   for trigeminal neuralgia  . ELECTROPHYSIOLOGY STUDY N/A 12/16/2011   Procedure: ELECTROPHYSIOLOGY STUDY;  Surgeon: Evans Lance, MD;  Location: [facility] CATH LAB;  Service: Cardiovascular;  Laterality: N/A;  . LEFT HEART CATHETERIZATION WITH CORONARY ANGIOGRAM N/A 12/13/2011   PTCA Procedure:  Surgeon: Hillary Bow, MD;  Location: [facility] CATH LAB;  Service: Cardiovascular;  Laterality: N/A;  . LEFT HEART CATHETERIZATION WITH CORONARY ANGIOGRAM N/A 12/16/2011   Procedure: LEFT HEART CATHETERIZATION WITH CORONARY ANGIOGRAM;  Surgeon: Thayer Headings, MD;  Location: [facility] CATH LAB;  Service: Cardiovascular;  Laterality: N/A;  . LUMBAR LAMINECTOMY/DECOMPRESSION MICRODISCECTOMY N/A 04/30/2017   Procedure: Microlumbar decompression L4-5, L3-4, L2-3, lateral mass fusion L4-5 with autograft/allograft bone;  Surgeon: Susa Day, MD;  Location: [facility] ORS;  Service: Orthopedics;  Laterality: N/A;  . NASAL SINUS SURGERY    . SPINE SURGERY     lumbar x 2  . uvla platoplasty      Social History   Socioeconomic History  . Marital status: Married    Spouse name: Jenny Reichmann  . Number of children: 3  . Years of education:  Not on file  . Highest education level: Not on file  Occupational History  . Occupation: Librarian, academic: [employer]  Tobacco Use  . Smoking status: Never Smoker  . Smokeless tobacco: Never Used  Substance and Sexual Activity  . Alcohol use: No  . Drug use: No  . Sexual activity: Yes  Other Topics Concern  . Not on file  Social History Narrative   Lives in [place], [place] with wife.    Exercise: No   Social Determinants of Health   Financial Resource Strain:   . Difficulty of Paying Living  Expenses: Not on file  Food Insecurity:   . Worried About Charity fundraiser in the Last Year: Not on file  . Ran Out of Food in the Last Year: Not on file  Transportation Needs:   . Lack of Transportation (Medical): Not on file  . Lack of Transportation (Non-Medical): Not on file  Physical Activity:   . Days of Exercise per Week: Not on file  . Minutes of Exercise per Session: Not on file  Stress:   . Feeling of Stress : Not on file  Social Connections:   . Frequency of Communication with Friends and Family: Not on file  . Frequency of Social Gatherings with Friends and Family: Not on file  . Attends Religious Services: Not on file  . Active Member of Clubs or Organizations: Not on file  . Attends Archivist Meetings: Not on file  . Marital Status: Not on file  Intimate Partner Violence:   . Fear of Current or Ex-Partner: Not on file  . Emotionally Abused: Not on file  . Physically Abused: Not on file  . Sexually Abused: Not on file    Family History  Problem Relation Age of Onset  . Hypertension Mother   . Heart disease Mother 29  . Hyperlipidemia Mother   . Pancreatic cancer Father   . Heart disease Brother 62  . Colon cancer Neg Hx   . Esophageal cancer Neg Hx   . Prostate cancer Neg Hx   . Rectal cancer Neg Hx   . Stomach cancer Neg Hx     ROS: no fevers or chills, productive cough, hemoptysis, dysphasia, odynophagia, melena, hematochezia, dysuria, hematuria, rash, seizure activity, orthopnea, PND, pedal edema, claudication. Remaining systems are negative.  Physical Exam: Well-developed well-nourished in no acute distress.  Skin is warm and dry.  HEENT is normal.  Neck is supple.  Chest is clear to auscultation with normal expansion.  Cardiovascular exam is regular rate and rhythm.  Abdominal exam nontender or distended. No masses palpated. Extremities show no edema. neuro grossly intact  ECG-normal sinus rhythm at a rate of 61, no ST changes.   Personally reviewed  A/P  1 coronary artery disease-patient doing well with no chest pain.  Plan to continue present dose of aspirin and statin.  2 hyperlipidemia-continue statin.  Kirk Ruths, MD

## 2019-06-17 ENCOUNTER — Ambulatory Visit (INDEPENDENT_AMBULATORY_CARE_PROVIDER_SITE_OTHER): Payer: Medicare Other | Admitting: Cardiology

## 2019-06-17 ENCOUNTER — Encounter: Payer: Self-pay | Admitting: Cardiology

## 2019-06-17 ENCOUNTER — Other Ambulatory Visit: Payer: Self-pay

## 2019-06-17 VITALS — BP 126/84 | HR 61 | Ht 71.0 in | Wt 233.0 lb

## 2019-06-17 DIAGNOSIS — E7800 Pure hypercholesterolemia, unspecified: Secondary | ICD-10-CM | POA: Diagnosis not present

## 2019-06-17 DIAGNOSIS — I2510 Atherosclerotic heart disease of native coronary artery without angina pectoris: Secondary | ICD-10-CM

## 2019-06-17 NOTE — Patient Instructions (Signed)

## 2019-08-16 DIAGNOSIS — H8110 Benign paroxysmal vertigo, unspecified ear: Secondary | ICD-10-CM | POA: Diagnosis not present

## 2019-09-08 DIAGNOSIS — Z0120 Encounter for dental examination and cleaning without abnormal findings: Secondary | ICD-10-CM | POA: Diagnosis not present

## 2019-09-09 DIAGNOSIS — R42 Dizziness and giddiness: Secondary | ICD-10-CM | POA: Diagnosis not present

## 2019-09-10 ENCOUNTER — Other Ambulatory Visit: Payer: Self-pay

## 2019-09-10 ENCOUNTER — Ambulatory Visit (INDEPENDENT_AMBULATORY_CARE_PROVIDER_SITE_OTHER): Payer: Medicare Other | Admitting: Rehabilitative and Restorative Service Providers"

## 2019-09-10 DIAGNOSIS — H8112 Benign paroxysmal vertigo, left ear: Secondary | ICD-10-CM

## 2019-09-10 NOTE — Therapy (Addendum)
[facility] [place] [place]  [address] [place], [place], [address] Phone: [phone]   Fax:  [phone]  Physical Therapy Evaluation and Discharge Summary Patient Details  Name: John Berry MRN: [id number] Date of Birth: 28-May-1951 Referring Provider (PT): Carolee Rota, NP   Encounter Date: 09/10/2019  PT End of Session - 09/10/19 1435    Visit Number  1    Number of Visits  4    Date for PT Re-Evaluation  10/25/19    PT Start Time  1402    PT Stop Time  1436    PT Time Calculation (min)  34 min    Activity Tolerance  Patient tolerated treatment well    Behavior During Therapy  [facility] for tasks assessed/performed       Past Medical History:  Diagnosis Date  . CAD (coronary artery disease)    2013- stent placed   . Cancer (HCC)    hx of skin cancer   . Diverticulosis   . History of heart artery stent   . Hyperlipidemia   . Hypertension   . OSA (obstructive sleep apnea)    surgery in 2011   . Personal history of colonic polyps - adenomas 08/13/2010   TUBULAR ADENOMAS (X4)  . Trigeminal neuralgia 4/11   Microvascular decompression    Past Surgical History:  Procedure Laterality Date  . ACHILLES TENDON REPAIR     x 3  . BACK SURGERY  2005  . BILATERAL CARPAL TUNNEL RELEASE    . COLONOSCOPY  08/13/2010  . CRANIOTOMY  10/25/2009   for trigeminal neuralgia  . ELECTROPHYSIOLOGY STUDY N/A 12/16/2011   Procedure: ELECTROPHYSIOLOGY STUDY;  Surgeon: Evans Lance, MD;  Location: [facility] CATH LAB;  Service: Cardiovascular;  Laterality: N/A;  . LEFT HEART CATHETERIZATION WITH CORONARY ANGIOGRAM N/A 12/13/2011   PTCA Procedure:  Surgeon: Hillary Bow, MD;  Location: [facility] CATH LAB;  Service: Cardiovascular;  Laterality: N/A;  . LEFT HEART CATHETERIZATION WITH CORONARY ANGIOGRAM N/A 12/16/2011   Procedure: LEFT HEART CATHETERIZATION WITH CORONARY ANGIOGRAM;  Surgeon: Thayer Headings, MD;  Location: [facility] CATH LAB;  Service:  Cardiovascular;  Laterality: N/A;  . LUMBAR LAMINECTOMY/DECOMPRESSION MICRODISCECTOMY N/A 04/30/2017   Procedure: Microlumbar decompression L4-5, L3-4, L2-3, lateral mass fusion L4-5 with autograft/allograft bone;  Surgeon: Susa Day, MD;  Location: [facility] ORS;  Service: Orthopedics;  Laterality: N/A;  . NASAL SINUS SURGERY    . SPINE SURGERY     lumbar x 2  . uvla platoplasty      There were no vitals filed for this visit.   Subjective Assessment - 09/10/19 1405    Subjective  The patient had sudden onset of dizziness worse in the morning when rising.  He also felt his balance was off in the morning.  He got dizziness with bending to pick up objects.  The patient had Covid around the holidays (tested positive when going to give blood- minimal symptoms).    Pertinent History  h/o sciatic pain/ low back pain-- prior back surgery    Patient Stated Goals  Reduce dizziness.    Currently in Pain?  No/denies         [facility] PT Assessment - 09/10/19 1408      Assessment   Medical Diagnosis  BPPV    Referring Provider (PT)  Carolee Rota, NP    Onset Date/Surgical Date  --   month ago   Prior Therapy  none      Precautions  Precautions  None      Restrictions   Weight Bearing Restrictions  No      Balance Screen   Has the patient fallen in the past 6 months  No    Has the patient had a decrease in activity level because of a fear of falling?   No    Is the patient reluctant to leave their home because of a fear of falling?   No      Home Environment   Living Environment  Private residence      Prior Function   Level of Independence  Independent           Vestibular Assessment - 09/10/19 1409      Vestibular Assessment   General Observation  No dizziness at rest      Positional Testing   Dix-Hallpike  Dix-Hallpike Right;Dix-Hallpike Left    Sidelying Test  Sidelying Right;Sidelying Left    Horizontal Canal Testing  Horizontal Canal Right;Horizontal Canal Left       Dix-Hallpike Right   Dix-Hallpike Right Duration  none    Dix-Hallpike Right Symptoms  No nystagmus      Dix-Hallpike Left   Dix-Hallpike Left Duration  trace sensation    Dix-Hallpike Left Symptoms  No nystagmus      Sidelying Right   Sidelying Right Duration  none    Sidelying Right Symptoms  No nystagmus      Sidelying Left   Sidelying Left Duration  trace amount with sidelying and return to sitting    Sidelying Left Symptoms  No nystagmus   viewed in room light     Horizontal Canal Right   Horizontal Canal Right Duration  none    Horizontal Canal Right Symptoms  Normal      Horizontal Canal Left   Horizontal Canal Left Duration  none    Horizontal Canal Left Symptoms  Normal          Objective measurements completed on examination: See above findings.       Vestibular Treatment/Exercise - 09/10/19 1455      Vestibular Treatment/Exercise   Vestibular Treatment Provided  Canalith Repositioning;Habituation    Canalith Repositioning  Epley Manuever Left    Habituation Exercises  Brandt Daroff       EPLEY MANUEVER LEFT   Number of Reps   1     RESPONSE DETAILS LEFT  tightness in neck limited head position      [facility]   Number of Reps   1    Symptom Description   mild sensation of dizziness; discussed and educated on goal of treatment            PT Education - 09/10/19 1428    Education Details  HEP    Person(s) Educated  Patient    Methods  Explanation;Demonstration;Handout    Comprehension  Returned demonstration;Verbalized understanding          PT Long Term Goals - 09/10/19 1436      PT LONG TERM GOAL #1   Title  The patient will be indep with HEP for habituation for self treatment BPPV    Time  6    Period  Weeks    Target Date  10/22/19      PT LONG TERM GOAL #2   Title  The patient will have negative L dix hallpike testing.             Plan - 09/10/19 1452    Clinical  Impression Statement  The patient is a 69 yo  male presenting to OP physical therapy for dizziness with onset 1 month ago.  His symptoms are worse in the morning and could not be fully provoked duirng today's evaluation.  He notes increased dizziness that is trace in duration and intensity with return to sit after L sidelying, initial transition to L sidelying, and during L dix hallpike (mild sensation, short duration).  No nystagmus viewed in room light.  PT initiated treatment based on symptoms and instructed in HEP for habituation if not full resolved.  Due to neck tightness he will most likely respond better to brandt daroff habituation versus epley's (challenging to achieve 30 degrees cervical spine extension).    Stability/Clinical Decision Making  Stable/Uncomplicated    Clinical Decision Making  Low    Rehab Potential  Good    PT Frequency  1x / week    PT Duration  4 weeks    PT Treatment/Interventions  Vestibular;Canalith Repostioning    PT Next Visit Plan  check for BPPV and treat as indicated    Consulted and Agree with Plan of Care  Patient       Patient will benefit from skilled therapeutic intervention in order to improve the following deficits and impairments:  Dizziness  Visit Diagnosis: BPPV (benign paroxysmal positional vertigo), left   PHYSICAL THERAPY DISCHARGE SUMMARY  Visits from Start of Care: 1/ eval only  Current functional level related to goals / functional outcomes: See clinical impression statement for patient status.  He did not return since eval.   Remaining deficits: See above   Education / Equipment: Home program  Plan: Patient agrees to discharge.  Patient goals were not met. Patient is being discharged due to not returning since the last visit.  ?????         Thank you for the referral of this patient. Rudell Cobb, MPT   Problem List Patient Active Problem List   Diagnosis Date Noted  . Spinal stenosis of lumbar region 04/30/2017  . Spinal stenosis at L4-L5 level 04/30/2017   . Coronary artery disease 12/14/2011  . SVT (supraventricular tachycardia) ([place]) 12/14/2011  . Syncope 12/14/2011  . Hyperlipidemia 12/14/2011  . Personal history of colonic polyps - adenomas 08/13/2010    Ellana Kawa, PT 09/10/2019, 2:57 PM  [facility] [place] [place] [address], [place], [address] Phone: [phone]   Fax:  [phone]  Name: John Berry MRN: [id number] Date of Birth: 1950/10/02

## 2019-09-10 NOTE — Patient Instructions (Signed)
Access Code: [email or web address]  URL: [email or web address]  Date: 09/10/2019  Prepared by: Rudell Cobb   Exercises Brandt-Daroff Vestibular Exercise - 5 reps - 1 sets - 2x daily - 7x weekly

## 2019-10-05 ENCOUNTER — Ambulatory Visit: Payer: Medicare Other | Admitting: Physical Therapy

## 2019-12-19 ENCOUNTER — Other Ambulatory Visit: Payer: Self-pay | Admitting: Cardiology

## 2020-01-12 DIAGNOSIS — Z20822 Contact with and (suspected) exposure to covid-19: Secondary | ICD-10-CM | POA: Diagnosis not present

## 2020-03-15 DIAGNOSIS — Z0120 Encounter for dental examination and cleaning without abnormal findings: Secondary | ICD-10-CM | POA: Diagnosis not present

## 2020-05-31 DIAGNOSIS — Z85820 Personal history of malignant melanoma of skin: Secondary | ICD-10-CM | POA: Diagnosis not present

## 2020-05-31 DIAGNOSIS — Z0000 Encounter for general adult medical examination without abnormal findings: Secondary | ICD-10-CM | POA: Diagnosis not present

## 2020-05-31 DIAGNOSIS — Z23 Encounter for immunization: Secondary | ICD-10-CM | POA: Diagnosis not present

## 2020-05-31 DIAGNOSIS — L570 Actinic keratosis: Secondary | ICD-10-CM | POA: Diagnosis not present

## 2020-05-31 DIAGNOSIS — M5136 Other intervertebral disc degeneration, lumbar region: Secondary | ICD-10-CM | POA: Diagnosis not present

## 2020-05-31 DIAGNOSIS — L814 Other melanin hyperpigmentation: Secondary | ICD-10-CM | POA: Diagnosis not present

## 2020-05-31 DIAGNOSIS — L821 Other seborrheic keratosis: Secondary | ICD-10-CM | POA: Diagnosis not present

## 2020-05-31 DIAGNOSIS — I2510 Atherosclerotic heart disease of native coronary artery without angina pectoris: Secondary | ICD-10-CM | POA: Diagnosis not present

## 2020-07-04 NOTE — Progress Notes (Signed)
Virtual Visit via Video Note   This visit type was conducted due to national recommendations for restrictions regarding the COVID-19 Pandemic (e.g. social distancing) in an effort to limit this patient's exposure and mitigate transmission in our community.  Due to his co-morbid illnesses, this patient is at least at moderate risk for complications without adequate follow up.  This format is felt to be most appropriate for this patient at this time.  All issues noted in this document were discussed and addressed.  A limited physical exam was performed with this format.  Please refer to the patient's chart for his consent to telehealth for [facility].      Date:  07/18/2020   ID:  John Berry, DOB 08-Jul-1950, MRN [id number]  Patient Location:Home Provider Location: Home  PCP:  Aura Dials, MD  Cardiologist:  Dr Stanford Breed  Evaluation Performed:  Follow-Up Visit  Chief Complaint:  FU CAD  History of Present Illness:    HPI: FU coronary artery disease. Myoview in June of 2013 showed an ejection fraction of 51%. There was felt to be diaphragmatic attenuation and possible inferior ischemia. The patient underwent catheterization in June of 2013 that showed no disease in the left main. There was a 70% LAD, 80-90% septal, and a small diagonal with a 90% ostial lesion. There was no significant disease in the circumflex. There was an 80% mid right coronary artery and the PDA had a 70% mid lesion. The patient had PCI of the right coronary artery. He has been treated medically for his LAD disease. His ejection fraction is preserved. The patient did have an EP study because of unexplained syncope in June of 2013. There was no inducible ventricular tachycardia or supraventricular tachycardia. Exercise tolerance test in March 2016 negative.MRA of abd 12/18 showed no AAA.Since last seen,pt has dyspnea with walking in cold; no CP; some numbness in legs bilaterally.   The patient does not  have symptoms concerning for COVID-19 infection (fever, chills, cough, or new shortness of breath).    Past Medical History:  Diagnosis Date   CAD (coronary artery disease)    2013- stent placed    Cancer ([place])    hx of skin cancer    Diverticulosis    History of heart artery stent    Hyperlipidemia    Hypertension    OSA (obstructive sleep apnea)    surgery in 2011    Personal history of colonic polyps - adenomas 08/13/2010   TUBULAR ADENOMAS (X4)   Trigeminal neuralgia 4/11   Microvascular decompression   Past Surgical History:  Procedure Laterality Date   ACHILLES TENDON REPAIR     x 3   BACK SURGERY  2005   BILATERAL CARPAL TUNNEL RELEASE     COLONOSCOPY  08/13/2010   CRANIOTOMY  10/25/2009   for trigeminal neuralgia   ELECTROPHYSIOLOGY STUDY N/A 12/16/2011   Procedure: ELECTROPHYSIOLOGY STUDY;  Surgeon: Evans Lance, MD;  Location: [facility] CATH LAB;  Service: Cardiovascular;  Laterality: N/A;   LEFT HEART CATHETERIZATION WITH CORONARY ANGIOGRAM N/A 12/13/2011   PTCA Procedure:  Surgeon: Hillary Bow, MD;  Location: [facility] CATH LAB;  Service: Cardiovascular;  Laterality: N/A;   LEFT HEART CATHETERIZATION WITH CORONARY ANGIOGRAM N/A 12/16/2011   Procedure: LEFT HEART CATHETERIZATION WITH CORONARY ANGIOGRAM;  Surgeon: Thayer Headings, MD;  Location: [facility] CATH LAB;  Service: Cardiovascular;  Laterality: N/A;   LUMBAR LAMINECTOMY/DECOMPRESSION MICRODISCECTOMY N/A 04/30/2017   Procedure: Microlumbar decompression L4-5, L3-4, L2-3, lateral mass fusion L4-5  with autograft/allograft bone;  Surgeon: Jene Every, MD;  Location: [facility] ORS;  Service: Orthopedics;  Laterality: N/A;   NASAL SINUS SURGERY     SPINE SURGERY     lumbar x 2   uvla platoplasty       Current Meds  Medication Sig   aspirin 81 MG tablet Take 1 tablet (81 mg total) by mouth daily. Resume 4 days post-op   metoprolol tartrate (LOPRESSOR) 25 MG tablet Take 1/2 (one-half) tablet by mouth twice  daily   nitroGLYCERIN (NITROSTAT) 0.4 MG SL tablet Place 1 tablet (0.4 mg total) under the tongue every 5 (five) minutes as needed for chest pain.   rosuvastatin (CRESTOR) 20 MG tablet 1 tablet   vitamin B-12 (CYANOCOBALAMIN) 1000 MCG tablet 1 tablet     Allergies:   Patient has no known allergies.   Social History   Tobacco Use   Smoking status: Never Smoker   Smokeless tobacco: Never Used  Vaping Use   Vaping Use: Never used  Substance Use Topics   Alcohol use: No   Drug use: No     Family Hx: The patient's family history includes Heart disease (age of onset: 40) in his brother and mother; Hyperlipidemia in his mother; Hypertension in his mother; Pancreatic cancer in his father. There is no history of Colon cancer, Esophageal cancer, Prostate cancer, Rectal cancer, or Stomach cancer.  ROS:   Please see the history of present illness.    No Fever, chills  or productive cough All other systems reviewed and are negative.  Recent Lipid Panel Lab Results  Component Value Date/Time   CHOL 120 11/17/2018 08:33 AM   TRIG 96 11/17/2018 08:33 AM   HDL 42 11/17/2018 08:33 AM   CHOLHDL 2.9 11/17/2018 08:33 AM   CHOLHDL 3.0 09/09/2016 08:43 AM   LDLCALC 59 11/17/2018 08:33 AM    Wt Readings from Last 3 Encounters:  07/18/20 225 lb (102.1 kg)  06/17/19 233 lb (105.7 kg)  11/09/18 225 lb (102.1 kg)     Objective:    Vital Signs:  Ht 5\' 11"  (1.803 m)    Wt 225 lb (102.1 kg)    BMI 31.38 kg/m    VITAL SIGNS:  reviewed NAD Answers questions appropriately Normal affect Remainder of physical examination not performed (telehealth visit; coronavirus pandemic)  ASSESSMENT & PLAN:    1 CAD-no CP; continue medical therapy with ASA and statin.  2 hyperlipidemia-increase Crestor to 40 mg daily.  Note he had difficulties with Lipitor in the past and if higher doses of rosuvastatin caused myalgias we will decrease.  Check lipids and liver in 12 weeks.  3 question  claudication-patient describes some numbness in his legs bilaterally.  He is concerned about vascular disease.  We will arrange ABIs with Doppler.  COVID-19 Education: The importance of social distancing was discussed today.  Time:   Today, I have spent 14 minutes with the patient with telehealth technology discussing the above problems.     Medication Adjustments/Labs and Tests Ordered: Current medicines are reviewed at length with the patient today.  Concerns regarding medicines are outlined above.   Tests Ordered: No orders of the defined types were placed in this encounter.   Medication Changes: No orders of the defined types were placed in this encounter.   Follow Up:  In Person in 6 month(s)  Signed, , MD  07/18/2020 9:29 AM    [facility]

## 2020-07-18 ENCOUNTER — Encounter: Payer: Self-pay | Admitting: Cardiology

## 2020-07-18 ENCOUNTER — Telehealth (INDEPENDENT_AMBULATORY_CARE_PROVIDER_SITE_OTHER): Payer: Medicare Other | Admitting: Cardiology

## 2020-07-18 ENCOUNTER — Telehealth: Payer: Self-pay | Admitting: Cardiology

## 2020-07-18 VITALS — Ht 71.0 in | Wt 225.0 lb

## 2020-07-18 DIAGNOSIS — I2510 Atherosclerotic heart disease of native coronary artery without angina pectoris: Secondary | ICD-10-CM

## 2020-07-18 DIAGNOSIS — E7800 Pure hypercholesterolemia, unspecified: Secondary | ICD-10-CM | POA: Diagnosis not present

## 2020-07-18 DIAGNOSIS — R200 Anesthesia of skin: Secondary | ICD-10-CM

## 2020-07-18 MED ORDER — ROSUVASTATIN CALCIUM 40 MG PO TABS: 40.0000 mg | ORAL_TABLET | Freq: Every day | ORAL | 3 refills | Status: DC

## 2020-07-18 NOTE — Patient Instructions (Signed)
Medication Instructions:  INCREASE rosuvastatin (Crestor) to 40 mg daily  *If you need a refill on your cardiac medications before your next appointment, please call your pharmacy*   Lab Work: Please return for FASTING labs in 12 weeks (Lipid, Hepatic)  Our in office lab hours are Monday-Friday 8:00-4:00, closed for lunch 12:45-1:45 pm.  No appointment needed.   Testing/Procedures: Your physician has requested that you have an ankle brachial index (ABI). During this test an ultrasound and blood pressure cuff are used to evaluate the arteries that supply the arms and legs with blood. Allow thirty minutes for this exam. There are no restrictions or special instructions.  Follow-Up: At [facility], you and your health needs are our priority.  As part of our continuing mission to provide you with exceptional heart care, we have created designated Provider Care Teams.  These Care Teams include your primary Cardiologist (physician) and Advanced Practice Providers (APPs -  Physician Assistants and Nurse Practitioners) who all work together to provide you with the care you need, when you need it.  We recommend signing up for the patient portal called "MyChart".  Sign up information is provided on this After Visit Summary.  MyChart is used to connect with patients for Virtual Visits (Telemedicine).  Patients are able to view lab/test results, encounter notes, upcoming appointments, etc.  Non-urgent messages can be sent to your provider as well.   To learn more about what you can do with MyChart, go to [email or web address].    Your next appointment:   6 month(s)  The format for your next appointment:   In Person  Provider:   Kirk Ruths, MD

## 2020-07-18 NOTE — Telephone Encounter (Signed)
Spoke with patient regarding scheduled appointment for LEA/ABI ordered by Dr. Felix Pacini Monday 07/31/20 at 10:00 am at [facility].  Patient voiced his understanding.

## 2020-07-31 ENCOUNTER — Ambulatory Visit (HOSPITAL_COMMUNITY)
Admission: RE | Admit: 2020-07-31 | Discharge: 2020-07-31 | Disposition: A | Discharge status: Home / Self Care | Payer: Medicare Other | Source: Ambulatory Visit | Attending: Internal Medicine | Admitting: Internal Medicine

## 2020-07-31 ENCOUNTER — Other Ambulatory Visit: Payer: Self-pay

## 2020-07-31 DIAGNOSIS — R200 Anesthesia of skin: Secondary | ICD-10-CM

## 2020-09-12 ENCOUNTER — Other Ambulatory Visit: Payer: Self-pay | Admitting: *Deleted

## 2020-09-12 DIAGNOSIS — E7800 Pure hypercholesterolemia, unspecified: Secondary | ICD-10-CM

## 2020-09-20 DIAGNOSIS — E7800 Pure hypercholesterolemia, unspecified: Secondary | ICD-10-CM | POA: Diagnosis not present

## 2020-09-21 ENCOUNTER — Encounter: Payer: Self-pay | Admitting: *Deleted

## 2020-09-21 LAB — HEPATIC FUNCTION PANEL
ALT: 13 IU/L (ref 0–44)
AST: 13 IU/L (ref 0–40)
Albumin: 4.2 g/dL (ref 3.8–4.8)
Alkaline Phosphatase: 38 IU/L — ABNORMAL LOW (ref 44–121)
Bilirubin Total: 0.4 mg/dL (ref 0.0–1.2)
Bilirubin, Direct: 0.17 mg/dL (ref 0.00–0.40)
Total Protein: 6.9 g/dL (ref 6.0–8.5)

## 2020-09-21 LAB — LIPID PANEL
Chol/HDL Ratio: 2.4 ratio (ref 0.0–5.0)
Cholesterol, Total: 115 mg/dL (ref 100–199)
HDL: 47 mg/dL (ref >39)
LDL Chol Calc (NIH): 52 mg/dL (ref 0–99)
Triglycerides: 78 mg/dL (ref 0–149)
VLDL Cholesterol Cal: 16 mg/dL (ref 5–40)

## 2020-12-18 ENCOUNTER — Other Ambulatory Visit: Payer: Self-pay | Admitting: Cardiology

## 2021-05-31 DIAGNOSIS — L438 Other lichen planus: Secondary | ICD-10-CM | POA: Diagnosis not present

## 2021-05-31 DIAGNOSIS — Z85820 Personal history of malignant melanoma of skin: Secondary | ICD-10-CM | POA: Diagnosis not present

## 2021-05-31 DIAGNOSIS — L570 Actinic keratosis: Secondary | ICD-10-CM | POA: Diagnosis not present

## 2021-05-31 DIAGNOSIS — L814 Other melanin hyperpigmentation: Secondary | ICD-10-CM | POA: Diagnosis not present

## 2021-06-06 DIAGNOSIS — Z0000 Encounter for general adult medical examination without abnormal findings: Secondary | ICD-10-CM | POA: Diagnosis not present

## 2021-06-06 DIAGNOSIS — I10 Essential (primary) hypertension: Secondary | ICD-10-CM | POA: Diagnosis not present

## 2021-06-06 DIAGNOSIS — E782 Mixed hyperlipidemia: Secondary | ICD-10-CM | POA: Diagnosis not present

## 2021-06-06 DIAGNOSIS — Z125 Encounter for screening for malignant neoplasm of prostate: Secondary | ICD-10-CM | POA: Diagnosis not present

## 2021-06-06 DIAGNOSIS — I2510 Atherosclerotic heart disease of native coronary artery without angina pectoris: Secondary | ICD-10-CM | POA: Diagnosis not present

## 2021-07-03 NOTE — Progress Notes (Signed)
HPI: FU coronary artery disease. Myoview in June of 2013 showed an ejection fraction of 51%. There was felt to be diaphragmatic attenuation and possible inferior ischemia. The patient underwent catheterization in June of 2013 that showed no disease in the left main. There was a 70% LAD, 80-90% septal, and a small diagonal with a 90% ostial lesion. There was no significant disease in the circumflex. There was an 80% mid right coronary artery and the PDA had a 70% mid lesion. The patient had PCI of the right coronary artery. He has been treated medically for his LAD disease. His ejection fraction is preserved. The patient did have an EP study because of unexplained syncope in June of 2013. There was no inducible ventricular tachycardia or supraventricular tachycardia. Exercise tolerance test in March 2016 negative. MRA of abd 12/18 showed no AAA. ABIs 1/22 normal. Since last seen, patient states that for the past year he has had increased dyspnea on exertion.  There is no orthopnea, PND, pedal edema, chest pain or syncope.  Current Outpatient Medications  Medication Sig Dispense Refill   aspirin 81 MG tablet Take 1 tablet (81 mg total) by mouth daily. Resume 4 days post-op 30 tablet    metoprolol tartrate (LOPRESSOR) 25 MG tablet Take 1/2 (one-half) tablet by mouth twice daily 90 tablet 3   nitroGLYCERIN (NITROSTAT) 0.4 MG SL tablet Place 1 tablet (0.4 mg total) under the tongue every 5 (five) minutes as needed for chest pain. 25 tablet 1   rosuvastatin (CRESTOR) 40 MG tablet Take 1 tablet (40 mg total) by mouth daily. 90 tablet 3   vitamin B-12 (CYANOCOBALAMIN) 1000 MCG tablet 1 tablet     No current facility-administered medications for this visit.     Past Medical History:  Diagnosis Date   CAD (coronary artery disease)    2013- stent placed    Cancer ([place])    hx of skin cancer    Diverticulosis    History of heart artery stent    Hyperlipidemia    Hypertension    OSA (obstructive  sleep apnea)    surgery in 2011    Personal history of colonic polyps - adenomas 08/13/2010   TUBULAR ADENOMAS (X4)   Trigeminal neuralgia 4/11   Microvascular decompression    Past Surgical History:  Procedure Laterality Date   ACHILLES TENDON REPAIR     x 3   BACK SURGERY  2005   BILATERAL CARPAL TUNNEL RELEASE     COLONOSCOPY  08/13/2010   CRANIOTOMY  10/25/2009   for trigeminal neuralgia   ELECTROPHYSIOLOGY STUDY N/A 12/16/2011   Procedure: ELECTROPHYSIOLOGY STUDY;  Surgeon: Evans Lance, MD;  Location: [facility] CATH LAB;  Service: Cardiovascular;  Laterality: N/A;   LEFT HEART CATHETERIZATION WITH CORONARY ANGIOGRAM N/A 12/13/2011   PTCA Procedure:  Surgeon: Hillary Bow, MD;  Location: [facility] CATH LAB;  Service: Cardiovascular;  Laterality: N/A;   LEFT HEART CATHETERIZATION WITH CORONARY ANGIOGRAM N/A 12/16/2011   Procedure: LEFT HEART CATHETERIZATION WITH CORONARY ANGIOGRAM;  Surgeon: Thayer Headings, MD;  Location: [facility] CATH LAB;  Service: Cardiovascular;  Laterality: N/A;   LUMBAR LAMINECTOMY/DECOMPRESSION MICRODISCECTOMY N/A 04/30/2017   Procedure: Microlumbar decompression L4-5, L3-4, L2-3, lateral mass fusion L4-5 with autograft/allograft bone;  Surgeon: Susa Day, MD;  Location: [facility] ORS;  Service: Orthopedics;  Laterality: N/A;   NASAL SINUS SURGERY     SPINE SURGERY     lumbar x 2   uvla platoplasty  Social History   Socioeconomic History   Marital status: Married    Spouse name: Jenny Reichmann   Number of children: 3   Years of education: Not on file   Highest education level: Not on file  Occupational History   Occupation: YOUTH PASTOR    Employer: [employer]  Tobacco Use   Smoking status: Never   Smokeless tobacco: Never  Vaping Use   Vaping Use: Never used  Substance and Sexual Activity   Alcohol use: No   Drug use: No   Sexual activity: Yes  Other Topics Concern   Not on file  Social History Narrative   Lives in [place], [place] with wife.    Exercise:  No   Social Determinants of Radio broadcast assistant Strain: Not on file  Food Insecurity: Not on file  Transportation Needs: Not on file  Physical Activity: Not on file  Stress: Not on file  Social Connections: Not on file  Intimate Partner Violence: Not on file    Family History  Problem Relation Age of Onset   Hypertension Mother    Heart disease Mother 84   Hyperlipidemia Mother    Pancreatic cancer Father    Heart disease Brother 71   Colon cancer Neg Hx    Esophageal cancer Neg Hx    Prostate cancer Neg Hx    Rectal cancer Neg Hx    Stomach cancer Neg Hx     ROS: Peripheral neuropathy but no fevers or chills, productive cough, hemoptysis, dysphasia, odynophagia, melena, hematochezia, dysuria, hematuria, rash, seizure activity, orthopnea, PND, pedal edema, claudication. Remaining systems are negative.  Physical Exam: Well-developed well-nourished in no acute distress.  Skin is warm and dry.  HEENT is normal.  Neck is supple.  Chest is clear to auscultation with normal expansion.  Cardiovascular exam is regular rate and rhythm.  Abdominal exam nontender or distended. No masses palpated. Extremities show no edema. neuro grossly intact  ECG-normal sinus rhythm at a rate of 66, no ST changes.  Personally reviewed  A/P  1 coronary artery disease-Continue aspirin and statin.  2 dyspnea-patient notes increased dyspnea on exertion.  He is concerned that this may be from his heart.  We will arrange a stress nuclear study to screen for ischemia and to assess LV function.  If normal we will plan to continue medical therapy.  2 hyperlipidemia-continue statin.  Obtain most recent lipids and liver from his primary care doctor.  We will adjust medications if needed.  3 peripheral vascular disease-no claudication.  Continue medical therapy.  Kirk Ruths, MD

## 2021-07-12 ENCOUNTER — Telehealth (HOSPITAL_COMMUNITY): Payer: Self-pay | Admitting: *Deleted

## 2021-07-12 ENCOUNTER — Encounter: Payer: Self-pay | Admitting: Cardiology

## 2021-07-12 ENCOUNTER — Ambulatory Visit: Payer: Medicare Other | Admitting: Cardiology

## 2021-07-12 ENCOUNTER — Other Ambulatory Visit: Payer: Self-pay

## 2021-07-12 VITALS — BP 134/80 | HR 66 | Ht 71.0 in | Wt 229.8 lb

## 2021-07-12 DIAGNOSIS — I2510 Atherosclerotic heart disease of native coronary artery without angina pectoris: Secondary | ICD-10-CM | POA: Diagnosis not present

## 2021-07-12 DIAGNOSIS — R0609 Other forms of dyspnea: Secondary | ICD-10-CM

## 2021-07-12 DIAGNOSIS — E7800 Pure hypercholesterolemia, unspecified: Secondary | ICD-10-CM | POA: Diagnosis not present

## 2021-07-12 NOTE — Telephone Encounter (Signed)
Patient given detailed instructions per Myocardial Perfusion Study Information Sheet for the test on 07/19/21 Patient notified to arrive 15 minutes early and that it is imperative to arrive on time for appointment to keep from having the test rescheduled.  If you need to cancel or reschedule your appointment, please call the office within 24 hours of your appointment. . Patient verbalized understanding.Kirstie Peri

## 2021-07-12 NOTE — Patient Instructions (Signed)
°  Testing/Procedures:  Your physician has requested that you have en exercise stress myoview. For further information please visit [email or web address]. Please follow instruction sheet, as given. [place] ALL MEDICATIONS   Follow-Up: At [facility], you and your health needs are our priority.  As part of our continuing mission to provide you with exceptional heart care, we have created designated Provider Care Teams.  These Care Teams include your primary Cardiologist (physician) and Advanced Practice Providers (APPs -  Physician Assistants and Nurse Practitioners) who all work together to provide you with the care you need, when you need it.  We recommend signing up for the patient portal called "MyChart".  Sign up information is provided on this After Visit Summary.  MyChart is used to connect with patients for Virtual Visits (Telemedicine).  Patients are able to view lab/test results, encounter notes, upcoming appointments, etc.  Non-urgent messages can be sent to your provider as well.   To learn more about what you can do with MyChart, go to [email or web address].    Your next appointment:   6 month(s)  The format for your next appointment:   In Person  Provider:   Kirk Ruths MD

## 2021-07-19 ENCOUNTER — Other Ambulatory Visit: Payer: Self-pay

## 2021-07-19 ENCOUNTER — Ambulatory Visit (HOSPITAL_COMMUNITY): Payer: Medicare Other | Attending: Cardiology

## 2021-07-19 DIAGNOSIS — R0609 Other forms of dyspnea: Secondary | ICD-10-CM | POA: Diagnosis not present

## 2021-07-19 LAB — MYOCARDIAL PERFUSION IMAGING
Angina Index: 0
Duke Treadmill Score: 8
Estimated workload: 10.1
Exercise duration (min): 8 min
Exercise duration (sec): 10 s
LV dias vol: 93 mL (ref 62–150)
LV sys vol: 47 mL
MPHR: 152 {beats}/min
Nuc Stress EF: 50 %
Peak HR: 133 {beats}/min
Percent HR: 87 %
Rest HR: 66 {beats}/min
Rest Nuclear Isotope Dose: 10.2 mCi
SDS: 3
SRS: 0
SSS: 3
ST Depression (mm): 0 mm
Stress Nuclear Isotope Dose: 30.8 mCi
TID: 0.86

## 2021-07-19 MED ORDER — TECHNETIUM TC 99M TETROFOSMIN IV KIT
10.2000 | PACK | Freq: Once | INTRAVENOUS | Status: AC | PRN
  Administered 2021-07-19: 10.2 via INTRAVENOUS
  Filled 2021-07-19: qty 11

## 2021-07-19 MED ORDER — TECHNETIUM TC 99M TETROFOSMIN IV KIT
30.8000 | PACK | Freq: Once | INTRAVENOUS | Status: AC | PRN
  Administered 2021-07-19: 30.8 via INTRAVENOUS
  Filled 2021-07-19: qty 31

## 2021-10-30 ENCOUNTER — Other Ambulatory Visit: Payer: Self-pay | Admitting: Cardiology

## 2022-03-29 ENCOUNTER — Other Ambulatory Visit: Payer: Self-pay

## 2022-03-29 ENCOUNTER — Encounter (HOSPITAL_BASED_OUTPATIENT_CLINIC_OR_DEPARTMENT_OTHER): Payer: Self-pay | Admitting: *Deleted

## 2022-03-29 ENCOUNTER — Emergency Department (HOSPITAL_BASED_OUTPATIENT_CLINIC_OR_DEPARTMENT_OTHER)
Admission: EM | Admit: 2022-03-29 | Discharge: 2022-03-30 | Disposition: A | Discharge status: Home / Self Care | Payer: Medicare Other | Attending: Emergency Medicine | Admitting: Emergency Medicine

## 2022-03-29 DIAGNOSIS — R202 Paresthesia of skin: Secondary | ICD-10-CM | POA: Diagnosis present

## 2022-03-29 DIAGNOSIS — I10 Essential (primary) hypertension: Secondary | ICD-10-CM | POA: Insufficient documentation

## 2022-03-29 DIAGNOSIS — I2510 Atherosclerotic heart disease of native coronary artery without angina pectoris: Secondary | ICD-10-CM | POA: Diagnosis not present

## 2022-03-29 DIAGNOSIS — M48062 Spinal stenosis, lumbar region with neurogenic claudication: Secondary | ICD-10-CM | POA: Diagnosis not present

## 2022-03-29 DIAGNOSIS — M48061 Spinal stenosis, lumbar region without neurogenic claudication: Secondary | ICD-10-CM | POA: Diagnosis not present

## 2022-03-29 DIAGNOSIS — Z7982 Long term (current) use of aspirin: Secondary | ICD-10-CM | POA: Insufficient documentation

## 2022-03-29 LAB — URINALYSIS, ROUTINE W REFLEX MICROSCOPIC
Bilirubin Urine: NEGATIVE
Glucose, UA: NEGATIVE mg/dL
Hgb urine dipstick: NEGATIVE
Ketones, ur: NEGATIVE mg/dL
Leukocytes,Ua: NEGATIVE
Nitrite: NEGATIVE
Protein, ur: NEGATIVE mg/dL
Specific Gravity, Urine: <1.005 — ABNORMAL LOW (ref 1.005–1.030)
pH: 6 (ref 5.0–8.0)

## 2022-03-29 LAB — CBC WITH DIFFERENTIAL/PLATELET
Abs Immature Granulocytes: 0 10*3/uL (ref 0.00–0.07)
Basophils Absolute: 0.1 10*3/uL (ref 0.0–0.1)
Basophils Relative: 1 %
Eosinophils Absolute: 0.1 10*3/uL (ref 0.0–0.5)
Eosinophils Relative: 2 %
HCT: 40.3 % (ref 39.0–52.0)
Hemoglobin: 12.6 g/dL — ABNORMAL LOW (ref 13.0–17.0)
Immature Granulocytes: 0 %
Lymphocytes Relative: 44 %
Lymphs Abs: 2.5 10*3/uL (ref 0.7–4.0)
MCH: 25.9 pg — ABNORMAL LOW (ref 26.0–34.0)
MCHC: 31.3 g/dL (ref 30.0–36.0)
MCV: 82.9 fL (ref 80.0–100.0)
Monocytes Absolute: 0.5 10*3/uL (ref 0.1–1.0)
Monocytes Relative: 9 %
Neutro Abs: 2.4 10*3/uL (ref 1.7–7.7)
Neutrophils Relative %: 44 %
Platelets: 240 10*3/uL (ref 150–400)
RBC: 4.86 MIL/uL (ref 4.22–5.81)
RDW: 18.3 % — ABNORMAL HIGH (ref 11.5–15.5)
WBC: 5.5 10*3/uL (ref 4.0–10.5)
nRBC: 0 % (ref 0.0–0.2)

## 2022-03-29 LAB — COMPREHENSIVE METABOLIC PANEL
ALT: 14 U/L (ref 0–44)
AST: 20 U/L (ref 15–41)
Albumin: 4.4 g/dL (ref 3.5–5.0)
Alkaline Phosphatase: 35 U/L — ABNORMAL LOW (ref 38–126)
Anion gap: 11 (ref 5–15)
BUN: 18 mg/dL (ref 8–23)
CO2: 26 mmol/L (ref 22–32)
Calcium: 9.1 mg/dL (ref 8.9–10.3)
Chloride: 105 mmol/L (ref 98–111)
Creatinine, Ser: 0.98 mg/dL (ref 0.61–1.24)
GFR, Estimated: >60 mL/min (ref >60)
Glucose, Bld: 101 mg/dL — ABNORMAL HIGH (ref 70–99)
Potassium: 3.7 mmol/L (ref 3.5–5.1)
Sodium: 142 mmol/L (ref 135–145)
Total Bilirubin: 0.4 mg/dL (ref 0.3–1.2)
Total Protein: 7.1 g/dL (ref 6.5–8.1)

## 2022-03-29 LAB — MAGNESIUM: Magnesium: 1.9 mg/dL (ref 1.7–2.4)

## 2022-03-29 MED ORDER — KETOROLAC TROMETHAMINE 60 MG/2ML IM SOLN
30.0000 mg | Freq: Once | INTRAMUSCULAR | Status: AC
  Administered 2022-03-29: 30 mg via INTRAMUSCULAR
  Filled 2022-03-29: qty 2

## 2022-03-29 MED ORDER — DEXAMETHASONE SODIUM PHOSPHATE 10 MG/ML IJ SOLN
10.0000 mg | Freq: Once | INTRAMUSCULAR | Status: AC
  Administered 2022-03-29: 10 mg via INTRAMUSCULAR
  Filled 2022-03-29: qty 1

## 2022-03-29 NOTE — ED Triage Notes (Signed)
Pt is here due to bilateral lower extremity weakness and tingling which has been a daily occurrence which has been ongoing for about a year but has been progressively worse. No focal weakness noted in triage, bilateral strong grip strength.  Today pt had a difficult time walking up hill but no fall.  Family wanted him to come in to be evaluated.

## 2022-03-29 NOTE — ED Provider Notes (Signed)
[place] EMERGENCY DEPT Provider Note  CSN: [id number] Arrival date & time: 03/29/22 1906  Chief Complaint(s) Weakness ED Triage Notes Flueckiger, Amedeo Kinsman, RN (Registered Nurse)   Date of Service: 03/29/2022  7:26 PM   Signed   Pt is here due to bilateral lower extremity weakness and tingling which has been a daily occurrence which has been ongoing for about a year but has been progressively worse. No focal weakness noted in triage, bilateral strong grip strength.  Today pt had a difficult time walking up hill but no fall.  Family wanted him to come in to be evaluated.      HPI John Berry is a 71 y.o. male    The history is provided by the patient.   CC: bilateral lower extremity weakness  Onset/Duration: 5 hours Timing: constant, improving Severity: mild to moderate Modifying Factors:  Improved by: lying still  Worsened by: standing up right Associated Signs/Symptoms:  Pertinent (+): lower back pain for 1 week with bilateral radiculopathy with standing  Pertinent (-): urinary or bowel incontinence.  Context: Today while walking up a hill, his legs gave out. He did not fall. States he could not lift his legs. h/o spinal stenosis s/p fusion with residual right foot numbness.    Past Medical History Past Medical History:  Diagnosis Date   CAD (coronary artery disease)    2013- stent placed    Cancer ([place])    hx of skin cancer    Diverticulosis    History of heart artery stent    Hyperlipidemia    Hypertension    OSA (obstructive sleep apnea)    surgery in 2011    Personal history of colonic polyps - adenomas 08/13/2010   TUBULAR ADENOMAS (X4)   Trigeminal neuralgia 4/11   Microvascular decompression   Patient Active Problem List   Diagnosis Date Noted   Spinal stenosis of lumbar region 04/30/2017   Spinal stenosis at L4-L5 level 04/30/2017   Coronary artery disease 12/14/2011   SVT (supraventricular tachycardia) ([place]) 12/14/2011   Syncope  12/14/2011   Hyperlipidemia 12/14/2011   Personal history of colonic polyps - adenomas 08/13/2010   Home Medication(s) Prior to Admission medications   Medication Sig Start Date End Date Taking? Authorizing Provider  aspirin 81 MG tablet Take 1 tablet (81 mg total) by mouth daily. Resume 4 days post-op 05/01/17   Lacie Draft M, PA-C  metoprolol tartrate (LOPRESSOR) 25 MG tablet Take 1/2 (one-half) tablet by mouth twice daily 10/30/21   Lelon Perla, MD  nitroGLYCERIN (NITROSTAT) 0.4 MG SL tablet Place 1 tablet (0.4 mg total) under the tongue every 5 (five) minutes as needed for chest pain. 01/21/19   Lelon Perla, MD  rosuvastatin (CRESTOR) 40 MG tablet Take 1 tablet (40 mg total) by mouth daily. 07/18/20   Lelon Perla, MD  vitamin B-12 (CYANOCOBALAMIN) 1000 MCG tablet 1 tablet Patient not taking: Reported on 07/12/2021    [provider]  Allergies Patient has no known allergies.  Review of Systems Review of Systems As noted in HPI  Physical Exam Vital Signs  I have reviewed the triage vital signs BP (!) 151/85   Pulse (!) 52   Temp 98.8 F (37.1 C) (Oral)   Resp 18   SpO2 97%  *** Physical Exam Vitals reviewed.  Constitutional:      General: He is not in acute distress.    Appearance: He is well-developed. He is not diaphoretic.  HENT:     Head: Normocephalic and atraumatic.     Right Ear: External ear normal.     Left Ear: External ear normal.     Nose: Nose normal.     Mouth/Throat:     Mouth: Mucous membranes are moist.  Eyes:     General: No scleral icterus.    Conjunctiva/sclera: Conjunctivae normal.  Neck:     Trachea: Phonation normal.  Cardiovascular:     Rate and Rhythm: Normal rate and regular rhythm.  Pulmonary:     Effort: Pulmonary effort is normal. No respiratory distress.     Breath sounds: No  stridor.  Abdominal:     General: There is no distension.  Musculoskeletal:        General: Normal range of motion.     Cervical back: Normal range of motion.     Lumbar back: Tenderness present.       Back:  Neurological:     Mental Status: He is alert and oriented to person, place, and time.     Comments: Spine Exam: Strength: 5/5 throughout LE bilaterally (hip flexion/extension, adduction/abduction; knee flexion/extension; foot dorsiflexion/plantarflexion, inversion/eversion; great toe inversion) Sensation: Intact to light touch in proximal and distal LE bilaterally Reflexes: no clonus   Psychiatric:        Behavior: Behavior normal.     ED Results and Treatments Labs (all labs ordered are listed, but only abnormal results are displayed) Labs Reviewed  COMPREHENSIVE METABOLIC PANEL - Abnormal; Notable for the following components:      Result Value   Glucose, Bld 101 (*)    Alkaline Phosphatase 35 (*)    All other components within normal limits  CBC WITH DIFFERENTIAL/PLATELET - Abnormal; Notable for the following components:   Hemoglobin 12.6 (*)    MCH 25.9 (*)    RDW 18.3 (*)    All other components within normal limits  URINALYSIS, ROUTINE W REFLEX MICROSCOPIC - Abnormal; Notable for the following components:   Color, Urine COLORLESS (*)    Specific Gravity, Urine <1.005 (*)    All other components within normal limits  MAGNESIUM                                                                                                                         EKG  EKG Interpretation  Date/Time:    Ventricular Rate:    PR Interval:    QRS Duration:   QT Interval:    QTC  Calculation:   R Axis:     Text Interpretation:         Radiology No results found.  Medications Ordered in ED Medications - No data to display                                                                                                                                    Procedures Procedures  (including critical care time)  Medical Decision Making / ED Course   Medical Decision Making         Final Clinical Impression(s) / ED Diagnoses Final diagnoses:  None    {Document critical care time when appropriate:1}  {Document review of labs and clinical decision tools ie heart score, Chads2Vasc2 etc:1}  {Document your independent review of radiology images, and any outside records:1} {Document your discussion with family members, caretakers, and with consultants:1} {Document social determinants of health affecting pt's care:1} {Document your decision making why or why not admission, treatments were needed:1} This chart was dictated using voice recognition software.  Despite best efforts to proofread,  errors can occur which can change the documentation meaning.

## 2022-03-29 NOTE — ED Notes (Signed)
Call to lab to ensure urine is ran

## 2022-03-30 MED ORDER — FAMOTIDINE 20 MG PO TABS: 20.0000 mg | ORAL_TABLET | Freq: Two times a day (BID) | ORAL | 0 refills | Status: DC

## 2022-03-30 MED ORDER — CYCLOBENZAPRINE HCL 10 MG PO TABS: 5.0000 mg | ORAL_TABLET | Freq: Every day | ORAL | 0 refills | Status: AC

## 2022-03-30 MED ORDER — DEXAMETHASONE 2 MG PO TABS: 2.0000 mg | ORAL_TABLET | ORAL | 0 refills | Status: AC

## 2022-03-30 MED ORDER — NAPROXEN 375 MG PO TABS: 375.0000 mg | ORAL_TABLET | Freq: Two times a day (BID) | ORAL | 0 refills | Status: DC

## 2022-04-03 ENCOUNTER — Telehealth: Payer: Self-pay

## 2022-04-03 DIAGNOSIS — M5451 Vertebrogenic low back pain: Secondary | ICD-10-CM | POA: Diagnosis not present

## 2022-04-03 NOTE — Telephone Encounter (Signed)
     Patient  visit on 9/30  at [place]  Have you been able to follow up with your primary care physician? yes  The patient was or was not able to obtain any needed medicine or equipment.yes  Are there diet recommendations that you are having difficulty following?na  Patient expresses understanding of discharge instructions and education provided has no other needs at this time. yes     [place], Care Management  [phone] [address], [place], [place] [address] Phone: [phone] Email: [email or web address]

## 2022-04-08 NOTE — Progress Notes (Deleted)
Cardiology Clinic Note   Patient Name: John Berry Date of Encounter: 04/08/2022  Primary Care Provider:  Aura Dials, MD Primary Cardiologist:  None  Patient Profile    ***  Past Medical History    Past Medical History:  Diagnosis Date   CAD (coronary artery disease)    2013- stent placed    Cancer ([place])    hx of skin cancer    Diverticulosis    History of heart artery stent    Hyperlipidemia    Hypertension    OSA (obstructive sleep apnea)    surgery in 2011    Personal history of colonic polyps - adenomas 08/13/2010   TUBULAR ADENOMAS (X4)   Trigeminal neuralgia 4/11   Microvascular decompression   Past Surgical History:  Procedure Laterality Date   ACHILLES TENDON REPAIR     x 3   BACK SURGERY  2005   BILATERAL CARPAL TUNNEL RELEASE     COLONOSCOPY  08/13/2010   CRANIOTOMY  10/25/2009   for trigeminal neuralgia   ELECTROPHYSIOLOGY STUDY N/A 12/16/2011   Procedure: ELECTROPHYSIOLOGY STUDY;  Surgeon: Evans Lance, MD;  Location: [facility] CATH LAB;  Service: Cardiovascular;  Laterality: N/A;   LEFT HEART CATHETERIZATION WITH CORONARY ANGIOGRAM N/A 12/13/2011   PTCA Procedure:  Surgeon: Hillary Bow, MD;  Location: [facility] CATH LAB;  Service: Cardiovascular;  Laterality: N/A;   LEFT HEART CATHETERIZATION WITH CORONARY ANGIOGRAM N/A 12/16/2011   Procedure: LEFT HEART CATHETERIZATION WITH CORONARY ANGIOGRAM;  Surgeon: Thayer Headings, MD;  Location: [facility] CATH LAB;  Service: Cardiovascular;  Laterality: N/A;   LUMBAR LAMINECTOMY/DECOMPRESSION MICRODISCECTOMY N/A 04/30/2017   Procedure: Microlumbar decompression L4-5, L3-4, L2-3, lateral mass fusion L4-5 with autograft/allograft bone;  Surgeon: Susa Day, MD;  Location: [facility] ORS;  Service: Orthopedics;  Laterality: N/A;   NASAL SINUS SURGERY     SPINE SURGERY     lumbar x 2   uvla platoplasty      Allergies  No Known Allergies  History of Present Illness    ***  Home Medications    Prior to Admission  medications   Medication Sig Start Date End Date Taking? Authorizing Provider  aspirin 81 MG tablet Take 1 tablet (81 mg total) by mouth daily. Resume 4 days post-op 05/01/17   Cecilie Kicks, PA-C  cyclobenzaprine (FLEXERIL) 10 MG tablet Take 0.5-1 tablets (5-10 mg total) by mouth at bedtime for 10 days. 03/30/22 04/09/22  Fatima Blank, MD  famotidine (PEPCID) 20 MG tablet Take 1 tablet (20 mg total) by mouth 2 (two) times daily. 03/30/22   Cardama, Grayce Sessions, MD  metoprolol tartrate (LOPRESSOR) 25 MG tablet Take 1/2 (one-half) tablet by mouth twice daily 10/30/21   Lelon Perla, MD  naproxen (NAPROSYN) 375 MG tablet Take 1 tablet (375 mg total) by mouth 2 (two) times daily. 03/30/22   Cardama, Grayce Sessions, MD  nitroGLYCERIN (NITROSTAT) 0.4 MG SL tablet Place 1 tablet (0.4 mg total) under the tongue every 5 (five) minutes as needed for chest pain. 01/21/19   Lelon Perla, MD  rosuvastatin (CRESTOR) 40 MG tablet Take 1 tablet (40 mg total) by mouth daily. 07/18/20   Lelon Perla, MD  vitamin B-12 (CYANOCOBALAMIN) 1000 MCG tablet 1 tablet Patient not taking: Reported on 07/12/2021    [provider]    Family History    Family History  Problem Relation Age of Onset   Hypertension Mother    Heart disease Mother 24  Hyperlipidemia Mother    Pancreatic cancer Father    Heart disease Brother 65   Colon cancer Neg Hx    Esophageal cancer Neg Hx    Prostate cancer Neg Hx    Rectal cancer Neg Hx    Stomach cancer Neg Hx    He indicated that his mother is deceased. He indicated that his father is deceased. He indicated that both of his sisters are alive. He indicated that only one of his two brothers is alive. He indicated that his maternal grandmother is deceased. He indicated that his maternal grandfather is deceased. He indicated that his paternal grandmother is deceased. He indicated that his paternal grandfather is deceased. He indicated that all of his  three daughters are alive. He indicated that the status of his neg hx is unknown.  Social History    Social History   Socioeconomic History   Marital status: Married    Spouse name: Jenny Reichmann   Number of children: 3   Years of education: Not on file   Highest education level: Not on file  Occupational History   Occupation: YOUTH PASTOR    Employer: [employer]  Tobacco Use   Smoking status: Never   Smokeless tobacco: Never  Vaping Use   Vaping Use: Never used  Substance and Sexual Activity   Alcohol use: No   Drug use: No   Sexual activity: Yes  Other Topics Concern   Not on file  Social History Narrative   Lives in [place], [place] with wife.    Exercise: No   Social Determinants of Health   Financial Resource Strain: Not on file  Food Insecurity: Not on file  Transportation Needs: Not on file  Physical Activity: Not on file  Stress: Not on file  Social Connections: Not on file  Intimate Partner Violence: Not on file     Review of Systems    General:  No chills, fever, night sweats or weight changes.  Cardiovascular:  No chest pain, dyspnea on exertion, edema, orthopnea, palpitations, paroxysmal nocturnal dyspnea. Dermatological: No rash, lesions/masses Respiratory: No cough, dyspnea Urologic: No hematuria, dysuria Abdominal:   No nausea, vomiting, diarrhea, bright red blood per rectum, melena, or hematemesis Neurologic:  No visual changes, wkns, changes in mental status. All other systems reviewed and are otherwise negative except as noted above.  Physical Exam    VS:  There were no vitals taken for this visit. , BMI There is no height or weight on file to calculate BMI. GEN: Well nourished, well developed, in no acute distress. HEENT: normal. Neck: Supple, no JVD, carotid bruits, or masses. Cardiac: RRR, no murmurs, rubs, or gallops. No clubbing, cyanosis, edema.  Radials/DP/PT 2+ and equal bilaterally.  Respiratory:  Respirations regular and unlabored,  clear to auscultation bilaterally. GI: Soft, nontender, nondistended, BS + x 4. MS: no deformity or atrophy. Skin: warm and dry, no rash. Neuro:  Strength and sensation are intact. Psych: Normal affect.  Accessory Clinical Findings    Recent Labs: 03/29/2022: ALT 14; BUN 18; Creatinine, Ser 0.98; Hemoglobin 12.6; Magnesium 1.9; Platelets 240; Potassium 3.7; Sodium 142   Recent Lipid Panel    Component Value Date/Time   CHOL 115 09/20/2020 0000   TRIG 78 09/20/2020 0000   HDL 47 09/20/2020 0000   CHOLHDL 2.4 09/20/2020 0000   CHOLHDL 3.0 09/09/2016 0843   VLDL 17 09/09/2016 0843   LDLCALC 52 09/20/2020 0000    No BP recorded.  {Refresh Note OR Click here to  enter BP  :1}***    ECG personally reviewed by me today- *** - No acute changes  Assessment & Plan   1.  ***   Jossie Ng. Ethelyne Erich NP-C     04/08/2022, 1:40 PM [place] [address] Office [phone] Fax [phone]  Notice: This dictation was prepared with Dragon dictation along with smaller phrase technology. Any transcriptional errors that result from this process are unintentional and may not be corrected upon review.  I spent***minutes examining this patient, reviewing medications, and using patient centered shared decision making involving her cardiac care.  Prior to her visit I spent greater than 20 minutes reviewing her past medical history,  medications, and prior cardiac tests.

## 2022-04-10 ENCOUNTER — Ambulatory Visit: Payer: Medicare Other | Admitting: General Practice

## 2022-04-10 ENCOUNTER — Emergency Department (HOSPITAL_COMMUNITY)
Admission: EM | Admit: 2022-04-10 | Discharge: 2022-04-10 | Discharge status: Against Medical Advice | Payer: Medicare Other

## 2022-04-10 DIAGNOSIS — M545 Low back pain, unspecified: Secondary | ICD-10-CM | POA: Diagnosis not present

## 2022-04-12 DIAGNOSIS — M5451 Vertebrogenic low back pain: Secondary | ICD-10-CM | POA: Diagnosis not present

## 2022-04-12 DIAGNOSIS — M48061 Spinal stenosis, lumbar region without neurogenic claudication: Secondary | ICD-10-CM | POA: Diagnosis not present

## 2022-04-15 ENCOUNTER — Ambulatory Visit: Payer: Self-pay | Admitting: Orthopedic Surgery

## 2022-04-15 DIAGNOSIS — M48062 Spinal stenosis, lumbar region with neurogenic claudication: Secondary | ICD-10-CM

## 2022-04-15 NOTE — H&P (Signed)
John Berry is an 71 y.o. male.   Chief Complaint: back and leg pain, weakness HPI: Reason for Visit: Diagnositc Results (lumbar MRI) Context: 3 weeks Location (Lower Extremity): lower back pain ; leg pain bilateral, , Associated Symptoms: numbness/tingling; swelling Medications: Oxycodone and Gabapentin Notes: Patient follows up, now nearly 3 weeks out from fairly sudden onset of bilateral lower extremity weakness. Reports that his right leg was still tingling intermittently from prior surgery approximately 4 years ago but was not bothering him much until a few weeks ago. Just prior to onset he also had been recently donating blood, he thought maybe that may be part of why he was feeling weak. States since he was last here, his symptoms have progressed, he is unable to stand due to weakness and pain, but most significantly weakness in both legs. Both legs feel equally weak. He was actually in the ER earlier this week due to symptoms. Denies any bowel or bladder incontinence or saddle anesthesia. We did start him on some prednisone earlier in the week which does not seem to be making much of a difference in his symptoms. He is here with his wife today.  Past Medical History:  Diagnosis Date   CAD (coronary artery disease)    2013- stent placed    Cancer ([place])    hx of skin cancer    Diverticulosis    History of heart artery stent    Hyperlipidemia    Hypertension    OSA (obstructive sleep apnea)    surgery in 2011    Personal history of colonic polyps - adenomas 08/13/2010   TUBULAR ADENOMAS (X4)   Trigeminal neuralgia 4/11   Microvascular decompression    Past Surgical History:  Procedure Laterality Date   ACHILLES TENDON REPAIR     x 3   BACK SURGERY  2005   BILATERAL CARPAL TUNNEL RELEASE     COLONOSCOPY  08/13/2010   CRANIOTOMY  10/25/2009   for trigeminal neuralgia   ELECTROPHYSIOLOGY STUDY N/A 12/16/2011   Procedure: ELECTROPHYSIOLOGY STUDY;  Surgeon: Evans Lance,  MD;  Location: [facility] CATH LAB;  Service: Cardiovascular;  Laterality: N/A;   LEFT HEART CATHETERIZATION WITH CORONARY ANGIOGRAM N/A 12/13/2011   PTCA Procedure:  Surgeon: Hillary Bow, MD;  Location: [facility] CATH LAB;  Service: Cardiovascular;  Laterality: N/A;   LEFT HEART CATHETERIZATION WITH CORONARY ANGIOGRAM N/A 12/16/2011   Procedure: LEFT HEART CATHETERIZATION WITH CORONARY ANGIOGRAM;  Surgeon: Thayer Headings, MD;  Location: [facility] CATH LAB;  Service: Cardiovascular;  Laterality: N/A;   LUMBAR LAMINECTOMY/DECOMPRESSION MICRODISCECTOMY N/A 04/30/2017   Procedure: Microlumbar decompression L4-5, L3-4, L2-3, lateral mass fusion L4-5 with autograft/allograft bone;  Surgeon: Susa Day, MD;  Location: [facility] ORS;  Service: Orthopedics;  Laterality: N/A;   NASAL SINUS SURGERY     SPINE SURGERY     lumbar x 2   uvla platoplasty      Family History  Problem Relation Age of Onset   Hypertension Mother    Heart disease Mother 59   Hyperlipidemia Mother    Pancreatic cancer Father    Heart disease Brother 42   Colon cancer Neg Hx    Esophageal cancer Neg Hx    Prostate cancer Neg Hx    Rectal cancer Neg Hx    Stomach cancer Neg Hx    Social History:  reports that he has never smoked. He has never used smokeless tobacco. He reports that he does not drink alcohol and does not use  drugs.  Allergies: No Known Allergies  Current meds: aspirin 81 mg tablet cyanocobalamin (vit B-12) 1,000 mcg tablet famotidine 20 mg tablet gabapentin 300 mg capsule metoprolol tartrate 25 mg tablet naproxen 375 mg tablet Nitrostat 0.4 mg sublingual tablet oxyCODONE 5 mg tablet predniSONE 5 mg tablets in a dose pack rosuvastatin 20 mg tablet rosuvastatin 40 mg tablet  Review of Systems  Constitutional: Negative.   HENT: Negative.    Eyes: Negative.   Respiratory: Negative.    Cardiovascular: Negative.   Gastrointestinal: Negative.   Endocrine: Negative.   Genitourinary: Negative.   Musculoskeletal:   Positive for back pain and gait problem.  Skin: Negative.   Neurological:  Positive for weakness and numbness.  Psychiatric/Behavioral: Negative.      There were no vitals taken for this visit. Physical Exam Constitutional:      Appearance: Normal appearance.  HENT:     Head: Normocephalic and atraumatic.     Right Ear: External ear normal.     Left Ear: External ear normal.     Nose: Nose normal.     Mouth/Throat:     Pharynx: Oropharynx is clear.  Eyes:     Conjunctiva/sclera: Conjunctivae normal.  Cardiovascular:     Rate and Rhythm: Normal rate.     Pulses: Normal pulses.  Pulmonary:     Effort: Pulmonary effort is normal.  Abdominal:     General: Bowel sounds are normal.  Musculoskeletal:     Cervical back: Normal range of motion.     Comments: Patient is awake, alert, and oriented 3. Well-nourished and well-developed. He is in a wheelchair. He does appear slightly pale today.  On examination of the lumbar spine, tender to palpation through the spinous processes and paraspinous musculature. Nontender bilateral buttocks and over the greater trochanter of the hips. Decreased flexion and extension lumbar spine. Positive seated straight leg raise reproducing back pain. Weakness with dorsiflexion bilaterally 4/5, EHL bilaterally 3/5, trace weakness in the right quad and hip flexor. No groin pain with rotation of the hips bilaterally. No clonus present, negative Babinski. Decreased sensation through the dorsum of the feet. No calf pain or sign of DVT.   Neurological:     Mental Status: He is alert.   Prior lumbar x-rays reviewed. Multilevel disc degeneration, facet arthrosis. Status post multilevel decompression. Status post lateral mass fusion which appears well incorporated. No listhesis noted.  MRI images and report reviewed today with Dr. Maxie Better. Postoperative changes L2-5 status post decompression. At L3-4 there is recurrent severe central stenosis, as well as foraminal  stenosis with prominent left L3 nerve root displacement. There does appear to be significant bony overgrowth following prior decompression.   Assessment/Plan Impression: Back pain and bilateral lower extremity weakness secondary to spinal stenosis and neurogenic claudication, significant stenosis at L3-4, recurrent with prior history of lumbar decompression 4 years ago  Plan: We again discussed relevant anatomy and etiology of his symptoms, diagnosis of spinal stenosis and different treatment options. At this point his symptoms have progressed even since last time he was here and he has significant bilateral lower extremity weakness and is unable to stand. Do not feel he would benefit from any type of injection therapy at this point. Would recommend proceeding ASAP with revision microlumbar decompression L3-4. He will not require surgical clearance but we will obtain preop labs especially given his recent blood donation with perhaps some slight anemia after that. We discussed risks, complications and alternatives including but not limited to DVT, PE,  failure procedure, need for secondary procedure, anesthesia risk even death. Discussed possibility especially with this being a revision surgery of spinal fluid leak. Discussed postoperative protocols. We will proceed accordingly with scheduling. He was seen in conjunction with Dr. Tonita Cong today. Questions were invited and answered and he desires to proceed.  Plan Revision microlumbar decompression L3-4  Cecilie Kicks, PA-C for Dr Tonita Cong 04/15/2022, 8:17 AM

## 2022-04-15 NOTE — H&P (View-Only) (Signed)
John Berry is an 71 y.o. male.   Chief Complaint: back and leg pain, weakness HPI: Reason for Visit: Diagnositc Results (lumbar MRI) Context: 3 weeks Location (Lower Extremity): lower back pain ; leg pain bilateral, , Associated Symptoms: numbness/tingling; swelling Medications: Oxycodone and Gabapentin Notes: Patient follows up, now nearly 3 weeks out from fairly sudden onset of bilateral lower extremity weakness. Reports that his right leg was still tingling intermittently from prior surgery approximately 4 years ago but was not bothering him much until a few weeks ago. Just prior to onset he also had been recently donating blood, he thought maybe that may be part of why he was feeling weak. States since he was last here, his symptoms have progressed, he is unable to stand due to weakness and pain, but most significantly weakness in both legs. Both legs feel equally weak. He was actually in the ER earlier this week due to symptoms. Denies any bowel or bladder incontinence or saddle anesthesia. We did start him on some prednisone earlier in the week which does not seem to be making much of a difference in his symptoms. He is here with his wife today.  Past Medical History:  Diagnosis Date   CAD (coronary artery disease)    2013- stent placed    Cancer ([place])    hx of skin cancer    Diverticulosis    History of heart artery stent    Hyperlipidemia    Hypertension    OSA (obstructive sleep apnea)    surgery in 2011    Personal history of colonic polyps - adenomas 08/13/2010   TUBULAR ADENOMAS (X4)   Trigeminal neuralgia 4/11   Microvascular decompression    Past Surgical History:  Procedure Laterality Date   ACHILLES TENDON REPAIR     x 3   BACK SURGERY  2005   BILATERAL CARPAL TUNNEL RELEASE     COLONOSCOPY  08/13/2010   CRANIOTOMY  10/25/2009   for trigeminal neuralgia   ELECTROPHYSIOLOGY STUDY N/A 12/16/2011   Procedure: ELECTROPHYSIOLOGY STUDY;  Surgeon: Evans Lance,  MD;  Location: [facility] CATH LAB;  Service: Cardiovascular;  Laterality: N/A;   LEFT HEART CATHETERIZATION WITH CORONARY ANGIOGRAM N/A 12/13/2011   PTCA Procedure:  Surgeon: Hillary Bow, MD;  Location: [facility] CATH LAB;  Service: Cardiovascular;  Laterality: N/A;   LEFT HEART CATHETERIZATION WITH CORONARY ANGIOGRAM N/A 12/16/2011   Procedure: LEFT HEART CATHETERIZATION WITH CORONARY ANGIOGRAM;  Surgeon: Thayer Headings, MD;  Location: [facility] CATH LAB;  Service: Cardiovascular;  Laterality: N/A;   LUMBAR LAMINECTOMY/DECOMPRESSION MICRODISCECTOMY N/A 04/30/2017   Procedure: Microlumbar decompression L4-5, L3-4, L2-3, lateral mass fusion L4-5 with autograft/allograft bone;  Surgeon: Susa Day, MD;  Location: [facility]S;  Service: Orthopedics;  Laterality: N/A;   NASAL SINUS SURGERY     SPINE SURGERY     lumbar x 2   uvla platoplasty      Family History  Problem Relation Age of Onset   Hypertension Mother    Heart disease Mother 64   Hyperlipidemia Mother    Pancreatic cancer Father    Heart disease Brother 67   Colon cancer Neg Hx    Esophageal cancer Neg Hx    Prostate cancer Neg Hx    Rectal cancer Neg Hx    Stomach cancer Neg Hx    Social History:  reports that he has never smoked. He has never used smokeless tobacco. He reports that he does not drink alcohol and does not use  drugs.  Allergies: No Known Allergies  Current meds: aspirin 81 mg tablet cyanocobalamin (vit B-12) 1,000 mcg tablet famotidine 20 mg tablet gabapentin 300 mg capsule metoprolol tartrate 25 mg tablet naproxen 375 mg tablet Nitrostat 0.4 mg sublingual tablet oxyCODONE 5 mg tablet predniSONE 5 mg tablets in a dose pack rosuvastatin 20 mg tablet rosuvastatin 40 mg tablet  Review of Systems  Constitutional: Negative.   HENT: Negative.    Eyes: Negative.   Respiratory: Negative.    Cardiovascular: Negative.   Gastrointestinal: Negative.   Endocrine: Negative.   Genitourinary: Negative.   Musculoskeletal:   Positive for back pain and gait problem.  Skin: Negative.   Neurological:  Positive for weakness and numbness.  Psychiatric/Behavioral: Negative.      There were no vitals taken for this visit. Physical Exam Constitutional:      Appearance: Normal appearance.  HENT:     Head: Normocephalic and atraumatic.     Right Ear: External ear normal.     Left Ear: External ear normal.     Nose: Nose normal.     Mouth/Throat:     Pharynx: Oropharynx is clear.  Eyes:     Conjunctiva/sclera: Conjunctivae normal.  Cardiovascular:     Rate and Rhythm: Normal rate.     Pulses: Normal pulses.  Pulmonary:     Effort: Pulmonary effort is normal.  Abdominal:     General: Bowel sounds are normal.  Musculoskeletal:     Cervical back: Normal range of motion.     Comments: Patient is awake, alert, and oriented 3. Well-nourished and well-developed. He is in a wheelchair. He does appear slightly pale today.  On examination of the lumbar spine, tender to palpation through the spinous processes and paraspinous musculature. Nontender bilateral buttocks and over the greater trochanter of the hips. Decreased flexion and extension lumbar spine. Positive seated straight leg raise reproducing back pain. Weakness with dorsiflexion bilaterally 4/5, EHL bilaterally 3/5, trace weakness in the right quad and hip flexor. No groin pain with rotation of the hips bilaterally. No clonus present, negative Babinski. Decreased sensation through the dorsum of the feet. No calf pain or sign of DVT.   Neurological:     Mental Status: He is alert.   Prior lumbar x-rays reviewed. Multilevel disc degeneration, facet arthrosis. Status post multilevel decompression. Status post lateral mass fusion which appears well incorporated. No listhesis noted.  MRI images and report reviewed today with Dr. Maxie Better. Postoperative changes L2-5 status post decompression. At L3-4 there is recurrent severe central stenosis, as well as foraminal  stenosis with prominent left L3 nerve root displacement. There does appear to be significant bony overgrowth following prior decompression.   Assessment/Plan Impression: Back pain and bilateral lower extremity weakness secondary to spinal stenosis and neurogenic claudication, significant stenosis at L3-4, recurrent with prior history of lumbar decompression 4 years ago  Plan: We again discussed relevant anatomy and etiology of his symptoms, diagnosis of spinal stenosis and different treatment options. At this point his symptoms have progressed even since last time he was here and he has significant bilateral lower extremity weakness and is unable to stand. Do not feel he would benefit from any type of injection therapy at this point. Would recommend proceeding ASAP with revision microlumbar decompression L3-4. He will not require surgical clearance but we will obtain preop labs especially given his recent blood donation with perhaps some slight anemia after that. We discussed risks, complications and alternatives including but not limited to DVT, PE,  failure procedure, need for secondary procedure, anesthesia risk even death. Discussed possibility especially with this being a revision surgery of spinal fluid leak. Discussed postoperative protocols. We will proceed accordingly with scheduling. He was seen in conjunction with Dr. Tonita Cong today. Questions were invited and answered and he desires to proceed.  Plan Revision microlumbar decompression L3-4  Cecilie Kicks, PA-C for Dr Tonita Cong 04/15/2022, 8:17 AM

## 2022-04-18 ENCOUNTER — Encounter (HOSPITAL_COMMUNITY): Payer: Self-pay

## 2022-04-18 NOTE — Pre-Procedure Instructions (Signed)
Surgical Instructions    Your procedure is scheduled on Wednesday, April 24, 2022 at 12:30 PM.  Report to [facility] Main Entrance "A" at 10:30 A.M., then check in with the Admitting office.  Call this number if you have problems the morning of surgery:  [phone]   If you have any questions prior to your surgery date call [phone]: Open Monday-Friday 8am-4pm  *If you experience any cold or flu symptoms such as cough, fever, chills, shortness of breath, etc. between now and your scheduled surgery, please notify us.*    Remember:  Do not eat after midnight the night before your surgery  You may drink clear liquids until 9:30 AM the morning of your surgery.   Clear liquids allowed are: Water, Non-Citrus Juices (without pulp), Carbonated Beverages, Clear Tea, Black Coffee Only (NO MILK, CREAM OR POWDERED CREAMER of any kind), and Gatorade.  Please complete your PRE-SURGERY ENSURE that was provided to you by 8:30 AM the morning of surgery.  Please, if able, drink it in one setting. DO NOT SIP.     Take these medicines the morning of surgery with A SIP OF WATER:  gabapentin (NEURONTIN) metoprolol tartrate (LOPRESSOR) rosuvastatin (CRESTOR) oxyCODONE (OXY IR/ROXICODONE) - if needed  Follow your surgeon's instructions on when to stop Aspirin.  If no instructions were given by your surgeon then you will need to call the office to get those instructions.    As of today, STOP taking any Aleve, Naproxen, Ibuprofen, Motrin, Advil, Goody's, BC's, all herbal medications, fish oil, and all vitamins.                     Do NOT Smoke (Tobacco/Vaping) for 24 hours prior to your procedure.  If you use a CPAP at night, you may bring your mask/headgear for your overnight stay.   Contacts, glasses, piercing's, hearing aid's, dentures or partials may not be worn into surgery, please bring cases for these belongings.    For patients admitted to the hospital, discharge time will be  determined by your treatment team.   Patients discharged the day of surgery will not be allowed to drive home, and someone needs to stay with them for 24 hours.  SURGICAL WAITING ROOM VISITATION Patients having surgery or a procedure may have two support people in the waiting area. Visitors may stay in the waiting area during the procedure and switch out with other visitors if needed. Children under the age of 28 must have an adult accompany them who is not the patient. If the patient needs to stay at the hospital during part of their recovery, the visitor guidelines for inpatient rooms apply.  Please refer to the [facility] website for the visitor guidelines for Inpatients (after your surgery is over and you are in a regular room).    Special instructions:   [place]- Preparing For Surgery  Before surgery, you can play an important role. Because skin is not sterile, your skin needs to be as free of germs as possible. You can reduce the number of germs on your skin by washing with CHG (chlorahexidine gluconate) Soap before surgery.  CHG is an antiseptic cleaner which kills germs and bonds with the skin to continue killing germs even after washing.    Oral Hygiene is also important to reduce your risk of infection.  Remember - BRUSH YOUR TEETH THE MORNING OF SURGERY WITH YOUR REGULAR TOOTHPASTE  Please do not use if you have an allergy to CHG or  antibacterial soaps. If your skin becomes reddened/irritated stop using the CHG.  Do not shave (including legs and underarms) for at least 48 hours prior to first CHG shower. It is OK to shave your face.  Please follow these instructions carefully.   Shower the NIGHT BEFORE SURGERY and the MORNING OF SURGERY  If you chose to wash your hair, wash your hair first as usual with your normal shampoo.  After you shampoo, rinse your hair and body thoroughly to remove the shampoo.  Use CHG Soap as you would any other liquid soap. You can apply CHG  directly to the skin and wash gently with a scrungie or a clean washcloth.   Apply the CHG Soap to your body ONLY FROM THE NECK DOWN.  Do not use on open wounds or open sores. Avoid contact with your eyes, ears, mouth and genitals (private parts). Wash Face and genitals (private parts)  with your normal soap.   Wash thoroughly, paying special attention to the area where your surgery will be performed.  Thoroughly rinse your body with warm water from the neck down.  DO NOT shower/wash with your normal soap after using and rinsing off the CHG Soap.  Pat yourself dry with a CLEAN TOWEL.  Wear CLEAN PAJAMAS to bed the night before surgery  Place CLEAN SHEETS on your bed the night before your surgery  DO NOT SLEEP WITH PETS.   Day of Surgery: Take a shower with CHG soap. Do not wear jewelry. Do not wear lotions, powders, perfumes/colognes, or deodorant. Do not shave 48 hours prior to surgery.  Men may shave face and neck. Do not bring valuables to the hospital.  [facility] is not responsible for any belongings or valuables. Wear Clean/Comfortable clothing the morning of surgery Do not apply any deodorants/lotions.   Remember to brush your teeth WITH YOUR REGULAR TOOTHPASTE.   Please read over the following fact sheets that you were given.  If you received a COVID test during your pre-op visit  it is requested that you wear a mask when out in public, stay away from anyone that may not be feeling well and notify your surgeon if you develop symptoms. If you have been in contact with anyone that has tested positive in the last 10 days please notify you surgeon.

## 2022-04-19 ENCOUNTER — Other Ambulatory Visit: Payer: Self-pay

## 2022-04-19 ENCOUNTER — Ambulatory Visit (HOSPITAL_COMMUNITY): Admission: RE | Admit: 2022-04-19 | Payer: Medicare Other | Source: Ambulatory Visit

## 2022-04-19 ENCOUNTER — Ambulatory Visit (HOSPITAL_COMMUNITY)
Admission: RE | Admit: 2022-04-19 | Discharge: 2022-04-19 | Disposition: A | Discharge status: Home / Self Care | Payer: Medicare Other | Source: Ambulatory Visit | Attending: Orthopedic Surgery | Admitting: Orthopedic Surgery

## 2022-04-19 ENCOUNTER — Encounter (HOSPITAL_COMMUNITY): Payer: Self-pay

## 2022-04-19 ENCOUNTER — Encounter (HOSPITAL_COMMUNITY)
Admission: RE | Admit: 2022-04-19 | Discharge: 2022-04-19 | Disposition: A | Discharge status: Home / Self Care | Payer: Medicare Other | Source: Ambulatory Visit | Attending: Specialist | Admitting: Specialist

## 2022-04-19 VITALS — BP 143/86 | HR 65 | Temp 97.7°F | Resp 17 | Ht 71.0 in | Wt 225.0 lb

## 2022-04-19 DIAGNOSIS — Z79899 Other long term (current) drug therapy: Secondary | ICD-10-CM | POA: Diagnosis not present

## 2022-04-19 DIAGNOSIS — Z955 Presence of coronary angioplasty implant and graft: Secondary | ICD-10-CM | POA: Diagnosis not present

## 2022-04-19 DIAGNOSIS — Z01818 Encounter for other preprocedural examination: Secondary | ICD-10-CM

## 2022-04-19 DIAGNOSIS — E785 Hyperlipidemia, unspecified: Secondary | ICD-10-CM | POA: Insufficient documentation

## 2022-04-19 DIAGNOSIS — I2510 Atherosclerotic heart disease of native coronary artery without angina pectoris: Secondary | ICD-10-CM | POA: Diagnosis not present

## 2022-04-19 DIAGNOSIS — I10 Essential (primary) hypertension: Secondary | ICD-10-CM | POA: Insufficient documentation

## 2022-04-19 DIAGNOSIS — M48062 Spinal stenosis, lumbar region with neurogenic claudication: Secondary | ICD-10-CM | POA: Diagnosis not present

## 2022-04-19 DIAGNOSIS — G4733 Obstructive sleep apnea (adult) (pediatric): Secondary | ICD-10-CM | POA: Insufficient documentation

## 2022-04-19 LAB — SURGICAL PCR SCREEN
MRSA, PCR: NEGATIVE
Staphylococcus aureus: NEGATIVE

## 2022-04-19 NOTE — Progress Notes (Signed)
PCP - Dr. Aura Dials Cardiologist - Dr. Osvaldo Angst  PPM/ICD - Denies  Spine x-ray - 04/19/22 EKG - 07/12/21 Stress Test - 07/19/21 ECHO - Denies Cardiac Cath - Denies  Sleep Study - Yes, OSA in the past; Pt states he had surgery done for it. CPAP - No  Diabetes: Denies  Blood Thinner Instructions: N/A Aspirin Instructions:   ERAS Protcol - Yes, PRE-SURGERY Ensure  COVID TEST- N/A   Anesthesia review: Yes, cardiac hx  Patient denies shortness of breath, fever, cough and chest pain at PAT appointment   All instructions explained to the patient, with a verbal understanding of the material. Patient agrees to go over the instructions while at home for a better understanding. Patient also instructed to self quarantine after being tested for COVID-19. The opportunity to ask questions was provided.

## 2022-04-22 ENCOUNTER — Encounter (HOSPITAL_COMMUNITY): Payer: Self-pay

## 2022-04-22 NOTE — Progress Notes (Signed)
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 ED visit 03/29/22 for low back pain with fairly abrupt bilateral extremity weakness, intact bowel and bladder function. Treated with Toradol and Decadron with close out-patient follow-up with spine surgeon.  History includes never smoker, HTN, HLD, CAD (s/p Evolve study stent mid RCA, residual LAD disease treated medically 12/13/11), OSA (moderate-severe OSA, AHI 55.1/hr 08/04/06, s/p septal reconstruction, turbinate reduction with UPPP 08/21/06, no CPAP), skin cancer, trigeminal neuralgia (s/p right suboccipital craniotomy for microvascular decompression 10/25/09), spinal surgery (2005; L2-5 decompression, lateral mass fusion L4-5 04/30/17).   Last cardiology visit with Dr. Stanford Breed was on 07/12/21. A nuclear stress test was ordered for increased DOE. Test was done on 07/19/21 and showed LVEF 50% (45-54%), moderate inferior defects worse at rest consistent with attenuation artifact, low risk study. Results reviewed by Dr. Stanford Breed who wrote, "No ischemia; continue medical therapy".   Non-ischemic stress test within the year. Denied chest pain and SOB at PAT RN visit. Hhe had CBC with diff,j CMP, Mg at 03/29/22 ED visit. L-spine xrays report is still pending. Anesthesia team to evaluate on the day of surgery.    VS: BP (!) 143/86   Pulse 65   Temp 36.5 C (Oral)   Resp 17   Ht '5\' 11"'$  (1.803 m)   Wt 102.1 kg   SpO2 98%   BMI 31.38 kg/m   PROVIDERS: Aura Dials, MD is PCP  Kirk Ruths, MD is cardiologist Jolyn Nap, MD is EP cardiologist. Last visit 02/13/12. S/p EPS in 2013 for syncope in setting of ischemic heart disease, s/p  RCA stent, moderate LAD treated medically, EF 45%. No inducible VT or SVT on 12/16/11 EP study. No recurrent syncope.   LABS: Labs results from 03/29/22 include: Lab Results  Component Value Date   WBC 5.5 03/29/2022   HGB 12.6 (L) 03/29/2022   HCT 40.3 03/29/2022   PLT 240 03/29/2022   GLUCOSE 101 (H) 03/29/2022   ALT 14 03/29/2022   AST 20 03/29/2022   NA 142 03/29/2022   K 3.7 03/29/2022   CL 105 03/29/2022   CREATININE 0.98 03/29/2022   BUN 18 03/29/2022   CO2 26 03/29/2022      IMAGES: Xray L-spine 04/19/22: In process.    EKG: 07/12/21: NSR. LAD.   CV: Nuclear stress test 07/19/21:   The study is normal. The study is low risk.   No ST deviation was noted.   Left ventricular function is normal. Nuclear stress EF: 50 %. The left ventricular ejection fraction is mildly decreased (45-54%). End diastolic cavity size is normal.   Prior study available for comparison from 12/12/2011.   Moderate inferior defect appears worse at rest then stress consistent with attenuation artifact.   EP Study 12/16/11 (for syncope in setting of ischemic heart disease, s/p RCA PCI, EF 45%) RESULTS:  This demonstrates no inducible VT or SVT.  The patient did have nonsustained atrial fibrillation induced with programmed atrial stimulation.  He also had a PR interval greater than the RR interval, but no inducible SVT.    Cardiac cath (selective) 12/15/21 (follow-up non obstructive edge disruption post-PCI): RCA: The RCA  is a very large caliber vessel.  It is tortuous.   There is approximately 20% narrowing in the first bend, but this is smooth.  The stent in the mid vessel is widely patent, without interruption, and the distal margin of the stent near the bend is smooth with no visible evidence of edge disruption.  There is moderate plaquing in the distal vessel, with some evidence of streaming as well due to the bend and large size.  This did not appear flow limiting.  The plaque in the PDA and the  remainder of the distal vessels are unchanged.   - Left ventriculography: Left ventricular systolic function is normal, LVEF is estimated at 55-65%, there is no significant mitral regurgitation  Final Conclusions:   1.  Continued patency of the RCA with a widely patent stent with smooth transitions.     Cardiac cath/PCI 12/13/11:  Coronary angiography: - Coronary dominance: right - Left mainstem: There is no obstruction and the vessel is free of disease. - LAD: The vessel courses to the apex.  It provides the apex.  There is a focal 70% lesion just after a severely diseased septal perforating vessel. The septal has tandem 80-90 lesions.  There is then a tiny diagonal, non graftable, with a 90% lesion ostial.  The remainder of the LAD is smooth without critical disease and is graftable.   - LCx: Large with two large marginals.  There is minor irregularity but no critical disease.  There is a tiny av vessel.   - RCA: Large caliber vessel with a shepherd's crook origin, but smooth vessel proximally.  In the mid portion of the mid vessel is a focal 80% stenosis.  The distal vessel courses distally and is large in caliber.  The PDA has a mid 70% lesion.  The PLA does not have critical disease. After stenting, there is no residual stenosis at the lesion site.  There is a linear contrast hang up extending proximally at the distal edge of the stent.  There is perhaps 10-20% narrowing just distal to the stent, but with good distal flow.  - Left ventriculography: Left ventricular systolic function is normal, LVEF is estimated at 45%, there is no significant mitral regurgitation.  The inferior base is severely hypokinetic.  There is mild anterolateral hypokinesis.   Final Conclusions:   1.  Successful PCI (Stent Evolve study stent) of the mid RCA with reduction in stenosis from 80% to 0% with distal non flow limiting edge disruption 2.  Moderate stenosis of the LAD and septal as noted. 3.  Reduced overall LV  function with an inferior wall motion abnormality.      Past Medical History:  Diagnosis Date   CAD (coronary artery disease)    s/p Evolve study stent mid RCA 12/13/11   Cancer ([place])    hx of skin cancer    Diverticulosis    History of heart artery stent    Hyperlipidemia    Hypertension    OSA (obstructive sleep apnea)    surgery in 2011    Personal history of colonic polyps - adenomas 08/13/2010   TUBULAR ADENOMAS (X4)   Pre-diabetes    Trigeminal neuralgia 09/2009   Microvascular decompression    Past Surgical History:  Procedure Laterality Date   ACHILLES TENDON REPAIR     x 3   BACK SURGERY  2005   BILATERAL CARPAL TUNNEL RELEASE     COLONOSCOPY  08/13/2010   CORONARY ANGIOPLASTY  12/13/2011   s/p  Evolve study stent mid RCA 12/13/11   CRANIOTOMY  10/25/2009   right suboccipital craniotomy for microvascular decompression for trigeminal neuralgia   ELECTROPHYSIOLOGY STUDY N/A 12/16/2011   Procedure: ELECTROPHYSIOLOGY STUDY;  Surgeon: Evans Lance, MD;  Location[facility] C[facility] LAB;  Service: Cardiovascular;  Laterality: N/A;   LEFT HEART CATHETERIZATION WITH CORONARY ANGIOGRAM N/A 12/13/2011   PTCA Procedure:  Surgeon: Hillary Bow, MD;  Location: [facility] CATH LAB;  Service: Cardiovascular;  Laterality: N/A;   LEFT HEART CATHETERIZATION WITH CORONARY ANGIOGRAM N/A 12/16/2011   Procedure: LEFT HEART CATHETERIZATION WITH CORONARY ANGIOGRAM;  Surgeon: Thayer Headings, MD;  Location: [facility] CATH LAB;  Service: Cardiovascular;  Laterality: N/A;   LUMBAR LAMINECTOMY/DECOMPRESSION MICRODISCECTOMY N/A 04/30/2017   Procedure: Microlumbar decompression L4-5, L3-4, L2-3, lateral mass fusion L4-5 with autograft/allograft bone;  Surgeon: Susa Day, MD;  Location: [facility] ORS;  Service: Orthopedics;  Laterality: N/A;   NASAL SINUS SURGERY     SPINE SURGERY     lumbar x 2   UVULOPALATOPHARYNGOPLASTY  08/21/2006    MEDICATIONS:  aspirin 81 MG tablet   famotidine (PEPCID) 20 MG tablet    ferrous sulfate 325 (65 FE) MG EC tablet   gabapentin (NEURONTIN) 300 MG capsule   metoprolol tartrate (LOPRESSOR) 25 MG tablet   naproxen (NAPROSYN) 375 MG tablet   nitroGLYCERIN (NITROSTAT) 0.4 MG SL tablet   oxyCODONE (OXY IR/ROXICODONE) 5 MG immediate release tablet   rosuvastatin (CRESTOR) 40 MG tablet   No current facility-administered medications for this encounter.  Advised to follow surgeon recommendations regarding perioperative ASA.    Myra Gianotti, PA-C Surgical Short Stay/Anesthesiology [facility] Phone [phone] [facility] Phone [phone] 04/22/2022 10:03 AM

## 2022-04-24 ENCOUNTER — Encounter (HOSPITAL_COMMUNITY): Payer: Self-pay | Admitting: Specialist

## 2022-04-24 ENCOUNTER — Ambulatory Visit (HOSPITAL_COMMUNITY): Payer: Medicare Other

## 2022-04-24 ENCOUNTER — Other Ambulatory Visit: Payer: Self-pay

## 2022-04-24 ENCOUNTER — Ambulatory Visit (HOSPITAL_COMMUNITY): Payer: Medicare Other | Admitting: Anesthesiology

## 2022-04-24 ENCOUNTER — Encounter (HOSPITAL_COMMUNITY)
Admission: RE | Disposition: A | Discharge status: Home / Self Care | Payer: Self-pay | Source: Home / Self Care | Attending: Specialist

## 2022-04-24 ENCOUNTER — Inpatient Hospital Stay (HOSPITAL_COMMUNITY)
Admission: RE | Admit: 2022-04-24 | Discharge: 2022-04-26 | DRG: 519 | Disposition: A | Discharge status: Home / Self Care | Payer: Medicare Other | Attending: Specialist | Admitting: Specialist

## 2022-04-24 ENCOUNTER — Ambulatory Visit (HOSPITAL_COMMUNITY): Payer: Medicare Other | Admitting: Vascular Surgery

## 2022-04-24 DIAGNOSIS — M48062 Spinal stenosis, lumbar region with neurogenic claudication: Secondary | ICD-10-CM | POA: Diagnosis not present

## 2022-04-24 DIAGNOSIS — I2510 Atherosclerotic heart disease of native coronary artery without angina pectoris: Secondary | ICD-10-CM | POA: Diagnosis present

## 2022-04-24 DIAGNOSIS — G4733 Obstructive sleep apnea (adult) (pediatric): Secondary | ICD-10-CM | POA: Diagnosis not present

## 2022-04-24 DIAGNOSIS — M5416 Radiculopathy, lumbar region: Secondary | ICD-10-CM | POA: Diagnosis present

## 2022-04-24 DIAGNOSIS — M7138 Other bursal cyst, other site: Secondary | ICD-10-CM | POA: Diagnosis not present

## 2022-04-24 DIAGNOSIS — K219 Gastro-esophageal reflux disease without esophagitis: Secondary | ICD-10-CM | POA: Diagnosis present

## 2022-04-24 DIAGNOSIS — G9611 Dural tear: Secondary | ICD-10-CM | POA: Diagnosis not present

## 2022-04-24 DIAGNOSIS — M48061 Spinal stenosis, lumbar region without neurogenic claudication: Secondary | ICD-10-CM | POA: Diagnosis not present

## 2022-04-24 DIAGNOSIS — Z800 Family history of malignant neoplasm of digestive organs: Secondary | ICD-10-CM | POA: Diagnosis not present

## 2022-04-24 DIAGNOSIS — Z79899 Other long term (current) drug therapy: Secondary | ICD-10-CM

## 2022-04-24 DIAGNOSIS — Z981 Arthrodesis status: Secondary | ICD-10-CM | POA: Diagnosis not present

## 2022-04-24 DIAGNOSIS — I10 Essential (primary) hypertension: Secondary | ICD-10-CM | POA: Diagnosis not present

## 2022-04-24 DIAGNOSIS — Y838 Other surgical procedures as the cause of abnormal reaction of the patient, or of later complication, without mention of misadventure at the time of the procedure: Secondary | ICD-10-CM | POA: Diagnosis not present

## 2022-04-24 DIAGNOSIS — Z7982 Long term (current) use of aspirin: Secondary | ICD-10-CM

## 2022-04-24 DIAGNOSIS — Z83438 Family history of other disorder of lipoprotein metabolism and other lipidemia: Secondary | ICD-10-CM | POA: Diagnosis not present

## 2022-04-24 DIAGNOSIS — Z8249 Family history of ischemic heart disease and other diseases of the circulatory system: Secondary | ICD-10-CM | POA: Diagnosis not present

## 2022-04-24 DIAGNOSIS — M4606 Spinal enthesopathy, lumbar region: Secondary | ICD-10-CM | POA: Diagnosis not present

## 2022-04-24 DIAGNOSIS — R262 Difficulty in walking, not elsewhere classified: Secondary | ICD-10-CM | POA: Diagnosis present

## 2022-04-24 DIAGNOSIS — M619 Calcification and ossification of muscle, unspecified: Secondary | ICD-10-CM | POA: Diagnosis not present

## 2022-04-24 DIAGNOSIS — Z955 Presence of coronary angioplasty implant and graft: Secondary | ICD-10-CM | POA: Diagnosis not present

## 2022-04-24 DIAGNOSIS — Z8719 Personal history of other diseases of the digestive system: Secondary | ICD-10-CM

## 2022-04-24 DIAGNOSIS — E785 Hyperlipidemia, unspecified: Secondary | ICD-10-CM | POA: Diagnosis not present

## 2022-04-24 DIAGNOSIS — Z85828 Personal history of other malignant neoplasm of skin: Secondary | ICD-10-CM

## 2022-04-24 DIAGNOSIS — G9741 Accidental puncture or laceration of dura during a procedure: Secondary | ICD-10-CM | POA: Diagnosis not present

## 2022-04-24 DIAGNOSIS — M6158 Other ossification of muscle, other site: Secondary | ICD-10-CM | POA: Diagnosis not present

## 2022-04-24 HISTORY — PX: LUMBAR LAMINECTOMY/DECOMPRESSION MICRODISCECTOMY: SHX5026

## 2022-04-24 SURGERY — LUMBAR LAMINECTOMY/DECOMPRESSION MICRODISCECTOMY 1 LEVEL
Anesthesia: General

## 2022-04-24 MED ORDER — ACETAMINOPHEN 650 MG RE SUPP: 650.0000 mg | RECTAL | Status: DC | PRN

## 2022-04-24 MED ORDER — PANTOPRAZOLE SODIUM 40 MG IV SOLR
40.0000 mg | Freq: Every day | INTRAVENOUS | Status: DC
  Administered 2022-04-24 – 2022-04-25 (×2): 40 mg via INTRAVENOUS
  Filled 2022-04-24 (×2): qty 10

## 2022-04-24 MED ORDER — FAMOTIDINE 20 MG PO TABS: 20.0000 mg | ORAL_TABLET | Freq: Two times a day (BID) | ORAL | Status: DC

## 2022-04-24 MED ORDER — ORAL CARE MOUTH RINSE: 15.0000 mL | Freq: Once | OROMUCOSAL | Status: AC

## 2022-04-24 MED ORDER — PROPOFOL 10 MG/ML IV BOLUS
INTRAVENOUS | Status: DC | PRN
  Administered 2022-04-24: 50 mg via INTRAVENOUS
  Administered 2022-04-24: 150 mg via INTRAVENOUS

## 2022-04-24 MED ORDER — ACETAMINOPHEN 325 MG PO TABS: 650.0000 mg | ORAL_TABLET | ORAL | Status: DC | PRN

## 2022-04-24 MED ORDER — 0.9 % SODIUM CHLORIDE (POUR BTL) OPTIME
TOPICAL | Status: DC | PRN
  Administered 2022-04-24 (×2): 1000 mL

## 2022-04-24 MED ORDER — MENTHOL 3 MG MT LOZG: 1.0000 | LOZENGE | OROMUCOSAL | Status: DC | PRN

## 2022-04-24 MED ORDER — DEXAMETHASONE SODIUM PHOSPHATE 10 MG/ML IJ SOLN
INTRAMUSCULAR | Status: DC | PRN
  Administered 2022-04-24: 5 mg via INTRAVENOUS

## 2022-04-24 MED ORDER — LIDOCAINE 2% (20 MG/ML) 5 ML SYRINGE
INTRAMUSCULAR | Status: DC | PRN
  Administered 2022-04-24: 100 mg via INTRAVENOUS

## 2022-04-24 MED ORDER — PROPOFOL 10 MG/ML IV BOLUS
INTRAVENOUS | Status: AC
  Filled 2022-04-24: qty 20

## 2022-04-24 MED ORDER — HYDROMORPHONE HCL 1 MG/ML IJ SOLN: 0.5000 mg | INTRAMUSCULAR | Status: DC | PRN

## 2022-04-24 MED ORDER — ROCURONIUM BROMIDE 10 MG/ML (PF) SYRINGE
PREFILLED_SYRINGE | INTRAVENOUS | Status: DC | PRN
  Administered 2022-04-24: 30 mg via INTRAVENOUS
  Administered 2022-04-24: 70 mg via INTRAVENOUS

## 2022-04-24 MED ORDER — ALUM & MAG HYDROXIDE-SIMETH 200-200-20 MG/5ML PO SUSP: 30.0000 mL | Freq: Four times a day (QID) | ORAL | Status: DC | PRN

## 2022-04-24 MED ORDER — BISACODYL 5 MG PO TBEC: 5.0000 mg | DELAYED_RELEASE_TABLET | Freq: Every day | ORAL | Status: DC | PRN

## 2022-04-24 MED ORDER — THROMBIN 20000 UNITS EX SOLR
CUTANEOUS | Status: AC
  Filled 2022-04-24: qty 20000

## 2022-04-24 MED ORDER — OXYCODONE HCL 5 MG PO TABS: 10.0000 mg | ORAL_TABLET | ORAL | Status: DC | PRN

## 2022-04-24 MED ORDER — MIDAZOLAM HCL 2 MG/2ML IJ SOLN
INTRAMUSCULAR | Status: AC
  Filled 2022-04-24: qty 2

## 2022-04-24 MED ORDER — ONDANSETRON HCL 4 MG/2ML IJ SOLN
INTRAMUSCULAR | Status: DC | PRN
  Administered 2022-04-24: 4 mg via INTRAVENOUS

## 2022-04-24 MED ORDER — OXYCODONE HCL 5 MG PO TABS: 5.0000 mg | ORAL_TABLET | ORAL | 0 refills | Status: DC | PRN

## 2022-04-24 MED ORDER — SUGAMMADEX SODIUM 200 MG/2ML IV SOLN
INTRAVENOUS | Status: DC | PRN
  Administered 2022-04-24: 50 mg via INTRAVENOUS
  Administered 2022-04-24: 150 mg via INTRAVENOUS

## 2022-04-24 MED ORDER — METOPROLOL TARTRATE 12.5 MG HALF TABLET
12.5000 mg | ORAL_TABLET | Freq: Two times a day (BID) | ORAL | Status: DC
  Administered 2022-04-24 – 2022-04-25 (×3): 12.5 mg via ORAL
  Filled 2022-04-24 (×3): qty 1

## 2022-04-24 MED ORDER — DROPERIDOL 2.5 MG/ML IJ SOLN: 0.6250 mg | Freq: Once | INTRAMUSCULAR | Status: DC | PRN

## 2022-04-24 MED ORDER — MAGNESIUM CITRATE PO SOLN
1.0000 | Freq: Once | ORAL | Status: DC | PRN
  Filled 2022-04-24: qty 296

## 2022-04-24 MED ORDER — PHENYLEPHRINE 80 MCG/ML (10ML) SYRINGE FOR IV PUSH (FOR BLOOD PRESSURE SUPPORT)
PREFILLED_SYRINGE | INTRAVENOUS | Status: DC | PRN
  Administered 2022-04-24: 240 ug via INTRAVENOUS
  Administered 2022-04-24: 160 ug via INTRAVENOUS

## 2022-04-24 MED ORDER — HYDROMORPHONE HCL 1 MG/ML IJ SOLN
INTRAMUSCULAR | Status: AC
  Filled 2022-04-24: qty 0.5

## 2022-04-24 MED ORDER — CEFAZOLIN SODIUM-DEXTROSE 2-4 GM/100ML-% IV SOLN
2.0000 g | INTRAVENOUS | Status: AC
  Administered 2022-04-24 (×2): 2 g via INTRAVENOUS
  Filled 2022-04-24: qty 100

## 2022-04-24 MED ORDER — NITROGLYCERIN 0.4 MG SL SUBL: 0.4000 mg | SUBLINGUAL_TABLET | SUBLINGUAL | Status: DC | PRN

## 2022-04-24 MED ORDER — METHOCARBAMOL 1000 MG/10ML IJ SOLN: 500.0000 mg | Freq: Four times a day (QID) | INTRAVENOUS | Status: DC | PRN

## 2022-04-24 MED ORDER — TRANEXAMIC ACID 1000 MG/10ML IV SOLN
2000.0000 mg | Freq: Once | INTRAVENOUS | Status: AC
  Administered 2022-04-24: 2000 mg via TOPICAL
  Filled 2022-04-24: qty 20

## 2022-04-24 MED ORDER — PHENOL 1.4 % MT LIQD: 1.0000 | OROMUCOSAL | Status: DC | PRN

## 2022-04-24 MED ORDER — DOCUSATE SODIUM 100 MG PO CAPS: 100.0000 mg | ORAL_CAPSULE | Freq: Two times a day (BID) | ORAL | 1 refills | Status: DC | PRN

## 2022-04-24 MED ORDER — ONDANSETRON HCL 4 MG/2ML IJ SOLN: 4.0000 mg | Freq: Four times a day (QID) | INTRAMUSCULAR | Status: DC | PRN

## 2022-04-24 MED ORDER — PHENYLEPHRINE HCL (PRESSORS) 10 MG/ML IV SOLN
INTRAVENOUS | Status: AC
  Filled 2022-04-24: qty 1

## 2022-04-24 MED ORDER — LACTATED RINGERS IV SOLN: INTRAVENOUS | Status: DC

## 2022-04-24 MED ORDER — POLYETHYLENE GLYCOL 3350 17 G PO PACK: 17.0000 g | PACK | Freq: Every day | ORAL | 0 refills | Status: AC

## 2022-04-24 MED ORDER — CHLORHEXIDINE GLUCONATE 0.12 % MT SOLN
OROMUCOSAL | Status: AC
  Filled 2022-04-24: qty 15

## 2022-04-24 MED ORDER — TRANEXAMIC ACID-NACL 1000-0.7 MG/100ML-% IV SOLN
1000.0000 mg | INTRAVENOUS | Status: AC
  Administered 2022-04-24: 1000 mg via INTRAVENOUS
  Filled 2022-04-24: qty 100

## 2022-04-24 MED ORDER — EPHEDRINE 5 MG/ML INJ
INTRAVENOUS | Status: AC
  Filled 2022-04-24: qty 5

## 2022-04-24 MED ORDER — METHOCARBAMOL 500 MG PO TABS: 500.0000 mg | ORAL_TABLET | Freq: Four times a day (QID) | ORAL | Status: DC | PRN

## 2022-04-24 MED ORDER — POLYETHYLENE GLYCOL 3350 17 G PO PACK: 17.0000 g | PACK | Freq: Every day | ORAL | Status: DC | PRN

## 2022-04-24 MED ORDER — ONDANSETRON HCL 4 MG PO TABS: 4.0000 mg | ORAL_TABLET | Freq: Four times a day (QID) | ORAL | Status: DC | PRN

## 2022-04-24 MED ORDER — FENTANYL CITRATE (PF) 250 MCG/5ML IJ SOLN
INTRAMUSCULAR | Status: AC
  Filled 2022-04-24: qty 5

## 2022-04-24 MED ORDER — EPHEDRINE SULFATE-NACL 50-0.9 MG/10ML-% IV SOSY
PREFILLED_SYRINGE | INTRAVENOUS | Status: DC | PRN
  Administered 2022-04-24: 5 mg via INTRAVENOUS
  Administered 2022-04-24: 10 mg via INTRAVENOUS

## 2022-04-24 MED ORDER — KCL IN DEXTROSE-NACL 20-5-0.45 MEQ/L-%-% IV SOLN
INTRAVENOUS | Status: AC
  Filled 2022-04-24: qty 1000

## 2022-04-24 MED ORDER — OXYCODONE HCL 5 MG PO TABS: 5.0000 mg | ORAL_TABLET | ORAL | Status: DC | PRN

## 2022-04-24 MED ORDER — GABAPENTIN 300 MG PO CAPS
300.0000 mg | ORAL_CAPSULE | Freq: Three times a day (TID) | ORAL | Status: DC
  Administered 2022-04-24 – 2022-04-25 (×3): 300 mg via ORAL
  Filled 2022-04-24 (×3): qty 1

## 2022-04-24 MED ORDER — DOCUSATE SODIUM 100 MG PO CAPS
100.0000 mg | ORAL_CAPSULE | Freq: Two times a day (BID) | ORAL | Status: DC
  Administered 2022-04-24 – 2022-04-25 (×3): 100 mg via ORAL
  Filled 2022-04-24 (×3): qty 1

## 2022-04-24 MED ORDER — CEFAZOLIN SODIUM-DEXTROSE 2-4 GM/100ML-% IV SOLN
2.0000 g | Freq: Three times a day (TID) | INTRAVENOUS | Status: AC
  Administered 2022-04-24 – 2022-04-25 (×2): 2 g via INTRAVENOUS
  Filled 2022-04-24 (×2): qty 100

## 2022-04-24 MED ORDER — PRONTOSAN WOUND IRRIGATION OPTIME
TOPICAL | Status: DC | PRN
  Administered 2022-04-24: 350 mL

## 2022-04-24 MED ORDER — KETAMINE HCL 50 MG/5ML IJ SOSY
PREFILLED_SYRINGE | INTRAMUSCULAR | Status: AC
  Filled 2022-04-24: qty 5

## 2022-04-24 MED ORDER — DEXMEDETOMIDINE HCL IN NACL 80 MCG/20ML IV SOLN
INTRAVENOUS | Status: DC | PRN
  Administered 2022-04-24 (×2): 8 ug via BUCCAL
  Administered 2022-04-24: 4 ug via BUCCAL

## 2022-04-24 MED ORDER — MIDAZOLAM HCL 2 MG/2ML IJ SOLN
INTRAMUSCULAR | Status: DC | PRN
  Administered 2022-04-24: 2 mg via INTRAVENOUS

## 2022-04-24 MED ORDER — BUPIVACAINE-EPINEPHRINE 0.5% -1:200000 IJ SOLN
INTRAMUSCULAR | Status: DC | PRN
  Administered 2022-04-24: 5 mL

## 2022-04-24 MED ORDER — BUPIVACAINE-EPINEPHRINE (PF) 0.5% -1:200000 IJ SOLN
INTRAMUSCULAR | Status: AC
  Filled 2022-04-24: qty 30

## 2022-04-24 MED ORDER — HYDROMORPHONE HCL 1 MG/ML IJ SOLN
INTRAMUSCULAR | Status: DC | PRN
  Administered 2022-04-24 (×8): .25 mg via INTRAMUSCULAR

## 2022-04-24 MED ORDER — KETAMINE HCL 10 MG/ML IJ SOLN
INTRAMUSCULAR | Status: DC | PRN
  Administered 2022-04-24 (×2): 20 mg via INTRAVENOUS
  Administered 2022-04-24: 30 mg via INTRAVENOUS

## 2022-04-24 MED ORDER — RISAQUAD PO CAPS
1.0000 | ORAL_CAPSULE | Freq: Every day | ORAL | Status: DC
  Administered 2022-04-25: 1 via ORAL
  Filled 2022-04-24: qty 1

## 2022-04-24 MED ORDER — FENTANYL CITRATE (PF) 250 MCG/5ML IJ SOLN
INTRAMUSCULAR | Status: DC | PRN
  Administered 2022-04-24: 100 ug via INTRAVENOUS
  Administered 2022-04-24 (×2): 50 ug via INTRAVENOUS

## 2022-04-24 MED ORDER — ACETAMINOPHEN 10 MG/ML IV SOLN
1000.0000 mg | INTRAVENOUS | Status: AC
  Administered 2022-04-24: 1000 mg via INTRAVENOUS
  Filled 2022-04-24: qty 100

## 2022-04-24 MED ORDER — THROMBIN 20000 UNITS EX SOLR
CUTANEOUS | Status: DC | PRN
  Administered 2022-04-24: 20 mL via TOPICAL

## 2022-04-24 MED ORDER — CHLORHEXIDINE GLUCONATE 0.12 % MT SOLN
15.0000 mL | Freq: Once | OROMUCOSAL | Status: AC
  Administered 2022-04-24: 15 mL via OROMUCOSAL

## 2022-04-24 MED ORDER — HYDROMORPHONE HCL 1 MG/ML IJ SOLN: 0.2500 mg | INTRAMUSCULAR | Status: DC | PRN

## 2022-04-24 SURGICAL SUPPLY — 67 items
BAG COUNTER SPONGE SURGICOUNT (BAG) ×1 IMPLANT
BAG DECANTER FOR FLEXI CONT (MISCELLANEOUS) IMPLANT
BAND RUBBER #18 3X1/16 STRL (MISCELLANEOUS) ×2 IMPLANT
BUR EGG ELITE 5.0 (BURR) IMPLANT
BUR RND DIAMOND ELITE 4.0 (BURR) IMPLANT
CLEANER TIP ELECTROSURG 2X2 (MISCELLANEOUS) ×1 IMPLANT
CNTNR URN SCR LID CUP LEK RST (MISCELLANEOUS) ×1 IMPLANT
CONT SPEC 4OZ STRL OR WHT (MISCELLANEOUS) ×1
DRAPE LAPAROTOMY 100X72X124 (DRAPES) ×1 IMPLANT
DRAPE MICROSCOPE SLANT 54X150 (MISCELLANEOUS) ×1 IMPLANT
DRAPE SHEET LG 3/4 BI-LAMINATE (DRAPES) ×1 IMPLANT
DRAPE SURG 17X11 SM STRL (DRAPES) ×1 IMPLANT
DRAPE UTILITY XL STRL (DRAPES) ×1 IMPLANT
DRSG AQUACEL AG ADV 3.5X 4 (GAUZE/BANDAGES/DRESSINGS) IMPLANT
DRSG AQUACEL AG ADV 3.5X 6 (GAUZE/BANDAGES/DRESSINGS) IMPLANT
DRSG AQUACEL AG ADV 3.5X10 (GAUZE/BANDAGES/DRESSINGS) IMPLANT
DRSG TELFA 3X8 NADH STRL (GAUZE/BANDAGES/DRESSINGS) IMPLANT
DURAGUARD 04CMX04CM IMPLANT
DURAPREP 26ML APPLICATOR (WOUND CARE) ×1 IMPLANT
DURASEAL APPLICATOR TIP (TIP) IMPLANT
DURASEAL SPINE SEALANT 3ML (MISCELLANEOUS) IMPLANT
ELECT BLADE 4.0 EZ CLEAN MEGAD (MISCELLANEOUS) ×1
ELECT REM PT RETURN 9FT ADLT (ELECTROSURGICAL) ×1
ELECTRODE BLDE 4.0 EZ CLN MEGD (MISCELLANEOUS) IMPLANT
ELECTRODE REM PT RTRN 9FT ADLT (ELECTROSURGICAL) ×1 IMPLANT
GLOVE BIOGEL PI IND STRL 7.5 (GLOVE) ×1 IMPLANT
GLOVE SURG SS PI 7.0 STRL IVOR (GLOVE) ×1 IMPLANT
GLOVE SURG SS PI 8.0 STRL IVOR (GLOVE) ×2 IMPLANT
GOWN STRL REUS W/ TWL LRG LVL3 (GOWN DISPOSABLE) ×1 IMPLANT
GOWN STRL REUS W/ TWL XL LVL3 (GOWN DISPOSABLE) ×1 IMPLANT
GOWN STRL REUS W/TWL LRG LVL3 (GOWN DISPOSABLE) ×3
GOWN STRL REUS W/TWL XL LVL3 (GOWN DISPOSABLE) ×3
GRAFT DURAGEN MATRIX 1WX1L (Tissue) IMPLANT
IV CATH 14GX2 1/4 (CATHETERS) ×1 IMPLANT
KIT BASIN OR (CUSTOM PROCEDURE TRAY) ×1 IMPLANT
NDL 22X1.5 STRL (OR ONLY) (MISCELLANEOUS) ×1 IMPLANT
NDL SPNL 18GX3.5 QUINCKE PK (NEEDLE) ×2 IMPLANT
NEEDLE 22X1.5 STRL (OR ONLY) (MISCELLANEOUS) ×1 IMPLANT
NEEDLE SPNL 18GX3.5 QUINCKE PK (NEEDLE) ×2 IMPLANT
PACK LAMINECTOMY NEURO (CUSTOM PROCEDURE TRAY) ×1 IMPLANT
PATTIES SURGICAL .5 X.5 (GAUZE/BANDAGES/DRESSINGS) IMPLANT
PATTIES SURGICAL .5 X1 (DISPOSABLE) IMPLANT
PATTIES SURGICAL .75X.75 (GAUZE/BANDAGES/DRESSINGS) ×1 IMPLANT
PATTIES SURGICAL 1X1 (DISPOSABLE) IMPLANT
SOLUTION PRONTOSAN WOUND 350ML (IRRIGATION / IRRIGATOR) IMPLANT
SPONGE SURGIFOAM ABS GEL 100 (HEMOSTASIS) ×1 IMPLANT
SPONGE T-LAP 4X18 ~~LOC~~+RFID (SPONGE) IMPLANT
STAPLER VISISTAT (STAPLE) IMPLANT
STRIP CLOSURE SKIN 1/2X4 (GAUZE/BANDAGES/DRESSINGS) ×1 IMPLANT
SUT ETHIBOND NAB CT1 #1 30IN (SUTURE) IMPLANT
SUT MNCRL AB 3-0 PS2 18 (SUTURE) IMPLANT
SUT NURALON 4 0 TR CR/8 (SUTURE) IMPLANT
SUT PROLENE 3 0 PS 2 (SUTURE) IMPLANT
SUT PROLENE 6 0 C 1 30 (SUTURE) IMPLANT
SUT VIC AB 1 CT1 27 (SUTURE) ×2
SUT VIC AB 1 CT1 27XBRD ANTBC (SUTURE) IMPLANT
SUT VIC AB 1-0 CT2 27 (SUTURE) IMPLANT
SUT VIC AB 2-0 CT1 27 (SUTURE) ×1
SUT VIC AB 2-0 CT1 TAPERPNT 27 (SUTURE) IMPLANT
SUT VIC AB 2-0 CT2 27 (SUTURE) IMPLANT
SYR 3ML LL SCALE MARK (SYRINGE) ×1 IMPLANT
TAPE STRIPS DRAPE STRL (GAUZE/BANDAGES/DRESSINGS) IMPLANT
TOWEL GREEN STERILE (TOWEL DISPOSABLE) ×1 IMPLANT
TOWEL GREEN STERILE FF (TOWEL DISPOSABLE) ×1 IMPLANT
TRAY FOLEY MTR SLVR 16FR STAT (SET/KITS/TRAYS/PACK) ×1 IMPLANT
WIPE CHG 2% 2PK PREOPERATIVE (MISCELLANEOUS) ×1 IMPLANT
YANKAUER SUCT BULB TIP NO VENT (SUCTIONS) ×1 IMPLANT

## 2022-04-24 NOTE — Discharge Instructions (Addendum)
Walk As Tolerated utilizing back precautions.  No bending, twisting, or lifting.  No driving for 2 weeks.   Aquacel dressing may remain in place until follow up. May shower with aquacel dressing in place. If the dressing peels off or becomes saturated, you may remove aquacel dressing and place gauze and tape dressing which should be kept clean and dry and changed daily. Do not remove steri-strips if they are present. See Dr. Tonita Cong in office in 10 to 14 days. Begin taking aspirin '81mg'$  per day starting 4 days after your surgery if not allergic to aspirin or on another blood thinner. Walk daily even outside. Use a cane or walker only if necessary. Avoid sitting on soft sofas. Be sure to take plenty of stool softeners to not strain for bowel movements. Brace yourself when coughing/sneezing. Do not lift more than 5 lbs.

## 2022-04-24 NOTE — Anesthesia Preprocedure Evaluation (Addendum)
Anesthesia Evaluation  Patient identified by MRN, date of birth, ID band Patient awake    Reviewed: Allergy & Precautions, NPO status , Patient's Chart, lab work & pertinent test results, reviewed documented beta blocker date and time   History of Anesthesia Complications Negative for: history of anesthetic complications  Airway Mallampati: III  TM Distance: >3 FB Neck ROM: Full    Dental no notable dental hx.    Pulmonary sleep apnea (s/p UPPP 2008) ,    Pulmonary exam normal        Cardiovascular hypertension, Pt. on home beta blockers and Pt. 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    GI/Hepatic GERD  Medicated and Controlled,  Endo/Other    Renal/GU      Musculoskeletal   Abdominal   Peds  Hematology   Anesthesia Other Findings   Reproductive/Obstetrics                            Anesthesia Physical Anesthesia Plan  ASA: 2  Anesthesia Plan: General   Post-op Pain Management: Tylenol PO (pre-op)* and Ketamine IV*   Induction: Intravenous  PONV Risk Score and Plan: 2 and Treatment may vary due to age or medical condition, Ondansetron, Dexamethasone and Midazolam  Airway Management Planned: Oral ETT  Additional Equipment: None  Intra-op Plan:   Post-operative Plan: Extubation in OR  Informed Consent: I have reviewed the patients History and Physical, chart, labs and discussed the procedure including the risks, benefits and alternatives for the proposed anesthesia with the patient or authorized representative who has indicated his/her understanding and acceptance.     Dental advisory given  Plan Discussed with: CRNA  Anesthesia Plan Comments:        Anesthesia Quick Evaluation

## 2022-04-24 NOTE — Interval H&P Note (Signed)
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  The various methods of treatment have been discussed with the patient and family. After consideration of risks, benefits and other options for treatment, the patient has consented to  Procedure(s) with comments: Revision microlumbar decompression L3-4 (N/A) - 2.5 hrs 3C-Bed as a surgical intervention.  The patient's history has been reviewed, patient examined, no change in status, stable for surgery.  I have reviewed the patient's chart and labs.  Questions were answered to the patient's satisfaction.     Johnn Hai

## 2022-04-24 NOTE — Anesthesia Procedure Notes (Signed)
Procedure Name: Intubation Date/Time: 04/24/2022 12:58 PM  Performed by: Elvin So, CRNAPre-anesthesia Checklist: Patient identified, Emergency Drugs available, Suction available and Patient being monitored Patient Re-evaluated:Patient Re-evaluated prior to induction Oxygen Delivery Method: Circle System Utilized Preoxygenation: Pre-oxygenation with 100% oxygen Induction Type: IV induction Ventilation: Mask ventilation without difficulty Laryngoscope Size: Mac and 4 Grade View: Grade I Tube type: Oral Number of attempts: 1 Airway Equipment and Method: Stylet and Oral airway Placement Confirmation: ETT inserted through vocal cords under direct vision, positive ETCO2 and breath sounds checked- equal and bilateral Secured at: 23 cm Tube secured with: Tape Dental Injury: Teeth and Oropharynx as per pre-operative assessment

## 2022-04-24 NOTE — Transfer of Care (Signed)
Immediate Anesthesia Transfer of Care Note  Patient: [name]  Procedure(s) Performed: Revision microlumbar decompression Lumbar three -four  Patient Location: PACU  Anesthesia Type:General  Level of Consciousness: drowsy and responds to stimulation  Airway & Oxygen Therapy: Patient Spontanous Breathing and Patient connected to face mask oxygen  Post-op Assessment: Report given to RN, Post -op Vital signs reviewed and stable, and Patient moving all extremities X 4  Post vital signs: Reviewed and stable  Last Vitals:  Vitals Value Taken Time  BP 142/104 04/24/22 2048  Temp 36.3 C 04/24/22 2030  Pulse 112 04/24/22 2058  Resp 16 04/24/22 2058  SpO2 95 % 04/24/22 2058  Vitals shown include unvalidated device data.  Last Pain:  Vitals:   04/24/22 2030  TempSrc:   PainSc: Asleep      Patients Stated Pain Goal: 0 (23/34/35 6861)  Complications: No notable events documented.

## 2022-04-25 ENCOUNTER — Encounter (HOSPITAL_COMMUNITY): Payer: Self-pay | Admitting: Specialist

## 2022-04-25 DIAGNOSIS — G4733 Obstructive sleep apnea (adult) (pediatric): Secondary | ICD-10-CM | POA: Diagnosis present

## 2022-04-25 DIAGNOSIS — R262 Difficulty in walking, not elsewhere classified: Secondary | ICD-10-CM | POA: Diagnosis present

## 2022-04-25 DIAGNOSIS — M48062 Spinal stenosis, lumbar region with neurogenic claudication: Secondary | ICD-10-CM | POA: Diagnosis present

## 2022-04-25 DIAGNOSIS — M48061 Spinal stenosis, lumbar region without neurogenic claudication: Secondary | ICD-10-CM | POA: Diagnosis present

## 2022-04-25 DIAGNOSIS — M4606 Spinal enthesopathy, lumbar region: Secondary | ICD-10-CM | POA: Diagnosis present

## 2022-04-25 DIAGNOSIS — Z79899 Other long term (current) drug therapy: Secondary | ICD-10-CM | POA: Diagnosis not present

## 2022-04-25 DIAGNOSIS — M619 Calcification and ossification of muscle, unspecified: Secondary | ICD-10-CM | POA: Diagnosis present

## 2022-04-25 DIAGNOSIS — Z800 Family history of malignant neoplasm of digestive organs: Secondary | ICD-10-CM | POA: Diagnosis not present

## 2022-04-25 DIAGNOSIS — Z85828 Personal history of other malignant neoplasm of skin: Secondary | ICD-10-CM | POA: Diagnosis not present

## 2022-04-25 DIAGNOSIS — Z7982 Long term (current) use of aspirin: Secondary | ICD-10-CM | POA: Diagnosis not present

## 2022-04-25 DIAGNOSIS — I2510 Atherosclerotic heart disease of native coronary artery without angina pectoris: Secondary | ICD-10-CM | POA: Diagnosis present

## 2022-04-25 DIAGNOSIS — Y838 Other surgical procedures as the cause of abnormal reaction of the patient, or of later complication, without mention of misadventure at the time of the procedure: Secondary | ICD-10-CM | POA: Diagnosis not present

## 2022-04-25 DIAGNOSIS — M7138 Other bursal cyst, other site: Secondary | ICD-10-CM | POA: Diagnosis present

## 2022-04-25 DIAGNOSIS — I10 Essential (primary) hypertension: Secondary | ICD-10-CM | POA: Diagnosis present

## 2022-04-25 DIAGNOSIS — Z83438 Family history of other disorder of lipoprotein metabolism and other lipidemia: Secondary | ICD-10-CM | POA: Diagnosis not present

## 2022-04-25 DIAGNOSIS — E785 Hyperlipidemia, unspecified: Secondary | ICD-10-CM | POA: Diagnosis present

## 2022-04-25 DIAGNOSIS — K219 Gastro-esophageal reflux disease without esophagitis: Secondary | ICD-10-CM | POA: Diagnosis present

## 2022-04-25 DIAGNOSIS — Z955 Presence of coronary angioplasty implant and graft: Secondary | ICD-10-CM | POA: Diagnosis not present

## 2022-04-25 DIAGNOSIS — G9741 Accidental puncture or laceration of dura during a procedure: Secondary | ICD-10-CM | POA: Diagnosis not present

## 2022-04-25 DIAGNOSIS — Z8719 Personal history of other diseases of the digestive system: Secondary | ICD-10-CM | POA: Diagnosis not present

## 2022-04-25 DIAGNOSIS — Z8249 Family history of ischemic heart disease and other diseases of the circulatory system: Secondary | ICD-10-CM | POA: Diagnosis not present

## 2022-04-25 DIAGNOSIS — M5416 Radiculopathy, lumbar region: Secondary | ICD-10-CM | POA: Diagnosis present

## 2022-04-25 LAB — COMPREHENSIVE METABOLIC PANEL
ALT: 21 U/L (ref 0–44)
AST: 45 U/L — ABNORMAL HIGH (ref 15–41)
Albumin: 2.9 g/dL — ABNORMAL LOW (ref 3.5–5.0)
Alkaline Phosphatase: 27 U/L — ABNORMAL LOW (ref 38–126)
Anion gap: 8 (ref 5–15)
BUN: 15 mg/dL (ref 8–23)
CO2: 25 mmol/L (ref 22–32)
Calcium: 8.5 mg/dL — ABNORMAL LOW (ref 8.9–10.3)
Chloride: 105 mmol/L (ref 98–111)
Creatinine, Ser: 1.05 mg/dL (ref 0.61–1.24)
GFR, Estimated: >60 mL/min (ref >60)
Glucose, Bld: 122 mg/dL — ABNORMAL HIGH (ref 70–99)
Potassium: 4.2 mmol/L (ref 3.5–5.1)
Sodium: 138 mmol/L (ref 135–145)
Total Bilirubin: 0.6 mg/dL (ref 0.3–1.2)
Total Protein: 5.7 g/dL — ABNORMAL LOW (ref 6.5–8.1)

## 2022-04-25 LAB — CBC WITH DIFFERENTIAL/PLATELET
Abs Immature Granulocytes: 0.04 10*3/uL (ref 0.00–0.07)
Basophils Absolute: 0 10*3/uL (ref 0.0–0.1)
Basophils Relative: 0 %
Eosinophils Absolute: 0 10*3/uL (ref 0.0–0.5)
Eosinophils Relative: 0 %
HCT: 37 % — ABNORMAL LOW (ref 39.0–52.0)
Hemoglobin: 12.3 g/dL — ABNORMAL LOW (ref 13.0–17.0)
Immature Granulocytes: 0 %
Lymphocytes Relative: 13 %
Lymphs Abs: 1.3 10*3/uL (ref 0.7–4.0)
MCH: 27.2 pg (ref 26.0–34.0)
MCHC: 33.2 g/dL (ref 30.0–36.0)
MCV: 81.7 fL (ref 80.0–100.0)
Monocytes Absolute: 0.7 10*3/uL (ref 0.1–1.0)
Monocytes Relative: 6 %
Neutro Abs: 8.4 10*3/uL — ABNORMAL HIGH (ref 1.7–7.7)
Neutrophils Relative %: 81 %
Platelets: 224 10*3/uL (ref 150–400)
RBC: 4.53 MIL/uL (ref 4.22–5.81)
RDW: 16.5 % — ABNORMAL HIGH (ref 11.5–15.5)
WBC: 10.4 10*3/uL (ref 4.0–10.5)
nRBC: 0 % (ref 0.0–0.2)

## 2022-04-25 MED ORDER — ORAL CARE MOUTH RINSE: 15.0000 mL | OROMUCOSAL | Status: DC | PRN

## 2022-04-25 MED ORDER — HYDROXYZINE HCL 50 MG/ML IM SOLN: 50.0000 mg | Freq: Four times a day (QID) | INTRAMUSCULAR | Status: DC | PRN

## 2022-04-25 MED ORDER — ACETAMINOPHEN 500 MG PO TABS: 1000.0000 mg | ORAL_TABLET | Freq: Once | ORAL | Status: DC

## 2022-04-25 NOTE — Progress Notes (Addendum)
Subjective: 2 Days Post-Op Procedure(s) (LRB): Revision microlumbar decompression Lumbar three -four (N/A) Patient reports pain as 4 on 0-10 scale.   Denies CP or SOB.  Voiding without difficulty. Positive flatus. Objective: Vital signs in last 24 hours: Temp:  [97.8 F (36.6 C)-98.9 F (37.2 C)] 97.8 F (36.6 C) (10/27 1125) Pulse Rate:  [70-93] 83 (10/27 1125) Resp:  [16-20] 20 (10/27 1125) BP: (113-142)/(72-88) 139/85 (10/27 1125) SpO2:  [94 %-98 %] 98 % (10/27 1125)  Intake/Output from previous day: 10/26 0701 - 10/27 0700 In: 240 [P.O.:240] Out: 3850 [Urine:3850] Intake/Output this shift: Total I/O In: -  Out: 350 [Urine:350]  Recent Labs    04/25/22 0737  HGB 12.3*   Recent Labs    04/25/22 0737  WBC 10.4  RBC 4.53  HCT 37.0*  PLT 224   Recent Labs    04/25/22 0652  NA 138  K 4.2  CL 105  CO2 25  BUN 15  CREATININE 1.05  GLUCOSE 122*  CALCIUM 8.5*   No results for input(s): "LABPT", "INR" in the last 72 hours.  ABD soft Sensation intact distally Intact pulses distally Compartment soft Good PF. 4/5 DF no change from preop Residual numbness bottom of feat better since preop. Perineal sensation intact. Quad 5/5  Assessment/Plan:  2 Days Post-Op Procedure(s) (LRB): Revision microlumbar decompression Lumbar three -four (N/A) Repair of dural rent. Discussed operation and repair of dural rent utilizing patch graft. No sign of a leak. No HA. HOB to 60 deg then 90 If no HA then OOB and reg diet.  Discussed avoiding lifting and straining.  And if there is any headaches nausea etc. that would be a sign of a CSF leak.  He is aware.   Principal Problem:   Spinal stenosis at L4-L5 level Active Problems:   Spinal stenosis of lumbar region    This exam was 6 PM on 10/27.  Johnn Hai 04/26/2022, '@NOW'$ 

## 2022-04-25 NOTE — Brief Op Note (Deleted)
04/24/2022  5:04 PM  PATIENT:  John Berry  71 y.o. 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 Rolena Infante, Dierdre Highman PA    ANESTHESIA:   general  EBL:  150 mL   BLOOD ADMINISTERED:none  DRAINS: none   LOCAL MEDICATIONS USED:  MARCAINE     SPECIMEN:  No Specimen  DISPOSITION OF SPECIMEN:  N/A  COUNTS:  YES  TOURNIQUET:  * No tourniquets in log *  DICTATION: .Other Dictation: Dictation Number [id number]  PLAN OF CARE: Admit to inpatient   PATIENT DISPOSITION:  PACU - hemodynamically stable.   Delay start of Pharmacological VTE agent (>24hrs) due to surgical blood loss or risk of bleeding: yes

## 2022-04-25 NOTE — Progress Notes (Signed)
  Subjective: 1 Day Post-Op Procedure(s) (LRB): Revision microlumbar decompression Lumbar three -four (N/A) and dural repair Patient reports pain as mild.  Has been flat but can still tell a difference in his radicular symptoms. No HA. No other c/o. Asking about going home.  Objective: Vital signs in last 24 hours: Temp:  [97.4 F (36.3 C)-98.4 F (36.9 C)] 98.3 F (36.8 C) (10/26 0758) Pulse Rate:  [68-110] 68 (10/26 0758) Resp:  [11-20] 18 (10/26 0758) BP: (123-159)/(78-104) 127/78 (10/26 0758) SpO2:  [92 %-98 %] 97 % (10/26 0758)  Intake/Output from previous day: 10/25 0701 - 10/26 0700 In: 2140 [P.O.:240; I.V.:1500; IV Piggyback:400] Out: 1150 [Urine:1000; Blood:150] Intake/Output this shift: Total I/O In: -  Out: 1100 [Urine:1100]  Recent Labs    04/25/22 0737  HGB 12.3*   Recent Labs    04/25/22 0737  WBC 10.4  RBC 4.53  HCT 37.0*  PLT 224   Recent Labs    04/25/22 0652  NA 138  K 4.2  CL 105  CO2 25  BUN 15  CREATININE 1.05  GLUCOSE 122*  CALCIUM 8.5*   No results for input(s): "LABPT", "INR" in the last 72 hours.  Neurologically intact ABD soft Neurovascular intact Sensation intact distally Intact pulses distally Dorsiflexion/Plantar flexion intact Incision: dressing C/D/I and no drainage No cellulitis present Compartment soft No sign of DVT   Assessment/Plan: 1 Day Post-Op Procedure(s) (LRB): Revision microlumbar decompression Lumbar three -four (N/A) Advance diet D/C IV fluids Head of bed at zero Plan to advance head of bed up later today Discussed with Dr. Tonita Cong Discussed dural tear and repair with patient   Cecilie Kicks 04/25/2022, 10:39 AM

## 2022-04-26 MED ORDER — CHLORHEXIDINE GLUCONATE CLOTH 2 % EX PADS: 6.0000 | MEDICATED_PAD | Freq: Every day | CUTANEOUS | Status: DC

## 2022-04-26 NOTE — Progress Notes (Signed)
Subjective: 2 Days Post-Op Procedure(s) (LRB): Revision microlumbar decompression Lumbar three -four (N/A) & repair dural tear Patient reports pain as mild.   Seated at 90 degrees this AM. Tolerating well. No HA, N/V, CP, SOB. Voiding without difficulty. Would like to try to go home today.  Objective: Vital signs in last 24 hours: Temp:  [98.1 F (36.7 C)-98.9 F (37.2 C)] 98.4 F (36.9 C) (10/27 0742) Pulse Rate:  [70-93] 93 (10/27 0742) Resp:  [16-20] 20 (10/27 0742) BP: (113-142)/(72-88) 141/76 (10/27 0742) SpO2:  [94 %-98 %] 96 % (10/27 0742)  Intake/Output from previous day: 10/26 0701 - 10/27 0700 In: 240 [P.O.:240] Out: 3850 [Urine:3850] Intake/Output this shift: No intake/output data recorded.  Recent Labs    04/25/22 0737  HGB 12.3*   Recent Labs    04/25/22 0737  WBC 10.4  RBC 4.53  HCT 37.0*  PLT 224   Recent Labs    04/25/22 0652  NA 138  K 4.2  CL 105  CO2 25  BUN 15  CREATININE 1.05  GLUCOSE 122*  CALCIUM 8.5*   No results for input(s): "LABPT", "INR" in the last 72 hours.  Neurologically intact ABD soft Neurovascular intact Sensation intact distally Intact pulses distally Dorsiflexion/Plantar flexion intact Incision: dressing C/D/I and no drainage No cellulitis present Compartment soft No sign of DVT   Assessment/Plan: 2 Days Post-Op Procedure(s) (LRB): Revision microlumbar decompression Lumbar three -four (N/A) Advance diet Up with therapy D/C IV fluids Mobilize with PT. As long as tolerates well plan for D/C later today Discussed with Dr Tonita Cong  Cecilie Kicks 04/26/2022, 8:14 AM

## 2022-04-26 NOTE — Evaluation (Signed)
Physical Therapy Evaluation Patient Details Name: John Berry MRN: [id number] DOB: January 31, 1951 Today's Date: 04/26/2022  History of Present Illness  71 yo male s/p revision microlumbar decompression L3-4 repair of dural tear on 04/24/22. PMH: back surgery 2018, CAD, skin CA, diverticulosis, heart stent, HLD, HTN, OSA, trigeminal neuralgia 2011, achilles tendon repair x3, craniotomy 2011 nasal sinus surgery,  Clinical Impression  Patient admitted following above procedure. PTA, patient lives with wife and was utilizing w/c for 4 weeks due to pain and has not been ambulating during that time period. Patient presents with weakness, impaired balance, and decreased activity tolerance. Educated patient on back precautions with mobility and car transfer with RW, patient verbalized understanding. Ambulated 68' with RW and min guard but keeps trunk and knees flexed throughout due to weakness and muscle tightness at hips. Discussed with patient about discharging home at w/c level and attending OPPT which patient is in agreement. Patient will benefit from skilled PT services during acute stay to address listed deficits. Recommend OPPT to address mobility, strengthening, and balance.         Recommendations for follow up therapy are one component of a multi-disciplinary discharge planning process, led by the attending physician.  Recommendations may be updated based on patient status, additional functional criteria and insurance authorization.  Follow Up Recommendations Outpatient PT      Assistance Recommended at Discharge Frequent or constant Supervision/Assistance  Patient can return home with the following  A lot of help with walking and/or transfers;Assistance with cooking/housework;Help with stairs or ramp for entrance;Assist for transportation    Equipment Recommendations Rolling Lev Cervone (2 wheels)  Recommendations for Other Services       Functional Status Assessment Patient has had a  recent decline in their functional status and demonstrates the ability to make significant improvements in function in a reasonable and predictable amount of time.     Precautions / Restrictions Precautions Precautions: Back Precaution Booklet Issued: No Precaution Comments: educated on back precautions .      Mobility  Bed Mobility               General bed mobility comments: up in recliner with OT on arrival    Transfers Overall transfer level: Needs assistance Equipment used: Rolling Davin Archuletta (2 wheels) Transfers: Sit to/from Stand Sit to Stand: Min assist           General transfer comment: assist to steady during UE transition to RW from recliner.    Ambulation/Gait Ambulation/Gait assistance: Min guard Gait Distance (Feet): 40 Feet Assistive device: Rolling Ahijah Devery (2 wheels) Gait Pattern/deviations: Step-through pattern, Decreased stride length, Knee flexed in stance - right, Knee flexed in stance - left, Trunk flexed Gait velocity: decreased     General Gait Details: min guard for safety. Cues for upright posture and knee extension with ambulation as patient tends to keep trunk flexed and knees flexed during mobility. No overt LOB but general unsteadiness with mobility.  Stairs            Wheelchair Mobility    Modified Rankin (Stroke Patients Only)       Balance Overall balance assessment: Needs assistance Sitting-balance support: No upper extremity supported, Feet supported Sitting balance-Leahy Scale: Fair     Standing balance support: Bilateral upper extremity supported, Reliant on assistive device for balance Standing balance-Leahy Scale: Poor Standing balance comment: reliant on UE support  Pertinent Vitals/Pain Pain Assessment Pain Assessment: Faces Faces Pain Scale: Hurts little more Pain Location: back during transition to standing Pain Descriptors / Indicators: Discomfort, Grimacing Pain  Intervention(s): Monitored during session    Home Living Family/patient expects to be discharged to:: Private residence Living Arrangements: Spouse/significant other Available Help at Discharge: Family;Available 24 hours/day Type of Home: House Home Access: Level entry (2in bump to get in the house)       Home Layout: One level Home Equipment: Cane - single point;Wheelchair - manual;Grab bars - tub/shower;Shower seat Additional Comments: dog and cat    Prior Function Prior Level of Function : Needs assist             Mobility Comments: has been using w/c for past 4 weeks due to pain. Performing squat pivots in/out of car and able to self propel w/c over 2 inch stoop to access home       Hand Dominance        Extremity/Trunk Assessment   Upper Extremity Assessment Upper Extremity Assessment: Defer to OT evaluation    Lower Extremity Assessment Lower Extremity Assessment: Generalized weakness (deconditioning and B hip tightness 2/2 primary w/c mobility for 4 weeks)    Cervical / Trunk Assessment Cervical / Trunk Assessment: Back Surgery  Communication   Communication: No difficulties  Cognition Arousal/Alertness: Awake/alert Behavior During Therapy: WFL for tasks assessed/performed Overall Cognitive Status: Within Functional Limits for tasks assessed                                          General Comments      Exercises     Assessment/Plan    PT Assessment Patient needs continued PT services  PT Problem List Decreased strength;Decreased activity tolerance;Decreased balance;Decreased range of motion;Decreased mobility;Decreased knowledge of precautions       PT Treatment Interventions Gait training;DME instruction;Functional mobility training;Stair training;Therapeutic activities;Balance training;Therapeutic exercise;Patient/family education    PT Goals (Current goals can be found in the Care Plan section)  Acute Rehab PT  Goals Patient Stated Goal: to go home PT Goal Formulation: With patient Time For Goal Achievement: 05/10/22 Potential to Achieve Goals: Good    Frequency Min 5X/week     Co-evaluation               AM-PAC PT "6 Clicks" Mobility  Outcome Measure Help needed turning from your back to your side while in a flat bed without using bedrails?: A Little Help needed moving from lying on your back to sitting on the side of a flat bed without using bedrails?: A Little Help needed moving to and from a bed to a chair (including a wheelchair)?: A Little Help needed standing up from a chair using your arms (e.g., wheelchair or bedside chair)?: A Little Help needed to walk in hospital room?: A Little Help needed climbing 3-5 steps with a railing? : Total 6 Click Score: 16    End of Session Equipment Utilized During Treatment: Gait belt Activity Tolerance: Patient tolerated treatment well Patient left: in chair;with call bell/phone within reach Nurse Communication: Mobility status PT Visit Diagnosis: Unsteadiness on feet (R26.81);Muscle weakness (generalized) (M62.81);Other abnormalities of gait and mobility (R26.89);Difficulty in walking, not elsewhere classified (R26.2)    Time: 8841-6606 PT Time Calculation (min) (ACUTE ONLY): 18 min   Charges:   PT Evaluation $PT Eval Moderate Complexity: 1 Mod  Laurina Fischl A. Gilford Rile PT, DPT Acute Rehabilitation Services Office [phone]   Linna Hoff 04/26/2022, 12:16 PM

## 2022-04-26 NOTE — Anesthesia Postprocedure Evaluation (Signed)
Anesthesia Post Note  Patient: John Berry  Procedure(s) Performed: Revision microlumbar decompression Lumbar three -four     Patient location during evaluation: PACU Anesthesia Type: General Level of consciousness: awake and alert Pain management: pain level controlled Vital Signs Assessment: post-procedure vital signs reviewed and stable Respiratory status: spontaneous breathing, nonlabored ventilation, respiratory function stable and patient connected to nasal cannula oxygen Cardiovascular status: blood pressure returned to baseline and stable Postop Assessment: no apparent nausea or vomiting Anesthetic complications: no   No notable events documented.  Last Vitals:  Vitals:   04/25/22 2339 04/26/22 0436  BP: 133/79 (!) 142/82  Pulse: 72 74  Resp: 20 20  Temp: 36.9 C 36.7 C  SpO2: 96% 94%    Last Pain:  Vitals:   04/26/22 0436  TempSrc: Oral  PainSc: 0-No pain                 Belenda Cruise P Mirza Kidney

## 2022-04-26 NOTE — Discharge Summary (Signed)
Physician Discharge Summary   Patient ID: John Berry MRN: [id number] DOB/AGE: 07-22-1950 71 y.o.  Admit date: 04/24/2022 Discharge date: 04/26/2022  Primary Diagnosis:   Recurrent Spinal stenosis L3-4  Admission Diagnoses:  Past Medical History:  Diagnosis Date   CAD (coronary artery disease)    s/p Evolve study stent mid RCA 12/13/11   Cancer ([place])    hx of skin cancer    Diverticulosis    History of heart artery stent    Hyperlipidemia    Hypertension    OSA (obstructive sleep apnea)    surgery in 2011    Personal history of colonic polyps - adenomas 08/13/2010   TUBULAR ADENOMAS (X4)   Pre-diabetes    Trigeminal neuralgia 09/2009   Microvascular decompression   Discharge Diagnoses:   Principal Problem:   Spinal stenosis at L4-L5 level Active Problems:   Spinal stenosis of lumbar region  Procedure:  Procedure(s) (LRB): Revision microlumbar decompression Lumbar three -four (N/A), repair of dural tear  Consults: None  HPI:  See H&P    Laboratory Data: Hospital Outpatient Visit on 04/19/2022  Component Date Value Ref Range Status   MRSA, PCR 04/19/2022 NEGATIVE  NEGATIVE Final   Staphylococcus aureus 04/19/2022 NEGATIVE  NEGATIVE Final   Comment: (NOTE) The Xpert SA Assay (FDA approved for NASAL specimens in patients 63 years of age and older), is one component of a comprehensive surveillance program. It is not intended to diagnose infection nor to guide or monitor treatment. Performed at [facility], [place] [address], [place], [place] [address]    Recent Labs    04/25/22 0737  HGB 12.3*   Recent Labs    04/25/22 0737  WBC 10.4  RBC 4.53  HCT 37.0*  PLT 224   Recent Labs    04/25/22 0652  NA 138  K 4.2  CL 105  CO2 25  BUN 15  CREATININE 1.05  GLUCOSE 122*  CALCIUM 8.5*   No results for input(s): "LABPT", "INR" in the last 72 hours.  X-Rays:DG Lumbar Spine 2-3 Views  Result Date: 04/24/2022 CLINICAL DATA:  Revision  of microlumbar decompression. EXAM: LUMBAR SPINE - 2-3 VIEW COMPARISON:  04/19/2022. FINDINGS: Three portable cross-table lateral lumbar images were obtained intraoperatively. Multilevel degenerative endplate changes and facet arthropathy is noted. Surgical instruments are identified at the L3-L4 region. Please see operative report for additional information. IMPRESSION: Intraoperative portable images obtained for microlumbar decompression. Please see operative report for additional information. Electronically Signed   By: Brett Fairy M.D.   On: 04/24/2022 20:39   DG Lumbar Spine 2-3 Views  Result Date: 04/22/2022 CLINICAL DATA:  Preop for decompression of L3-4. EXAM: LUMBAR SPINE - 2-3 VIEW COMPARISON:  April 29, 2017 FINDINGS: There is no evidence of lumbar spine fracture. Alignment is normal. Degenerative joint changes with narrowed joint space, osteophyte formation facet joint sclerosis are identified throughout the lumbar spine. Multilevel facet joint sclerosis is worsened compared exam. IMPRESSION: Degenerative joint changes of lumbar spine. Electronically Signed   By: Abelardo Diesel M.D.   On: 04/22/2022 10:20    EKG: Orders placed or performed in visit on 07/12/21   EKG 12-Lead     Hospital Course: Patient was admitted to [facility] and taken to the OR and underwent the above state procedure without complications.  Patient tolerated the procedure well and was later transferred to the recovery room and then to the orthopaedic floor for postoperative care.  They were given PO and IV  analgesics for pain control following their surgery.  They were given 24 hours of postoperative antibiotics.   Due to dural repair, patient was flat for 24 hours then slowly elevated head of the bed on POD 1 which was well tolerated.  Discharge planning was consulted to help with postop disposition and equipment needs.  Patient had a fair night on the evening of surgery and started to get up OOB with  therapy on day two. The patient was allowed to be WBAT with therapy and was taught back precautions.  Patient was seen in rounds and was ready to go home on day two.  They were given discharge instructions and dressing directions.  They were instructed on when to follow up in the office with Dr. Tonita Cong.   Diet: Regular diet Activity:WBAT, Lspine precautions Follow-up:in 10-14 days Disposition - Home Discharged Condition: good   Discharge Instructions     Call MD / Call 911   Complete by: As directed    If you experience chest pain or shortness of breath, CALL 911 and be transported to the hospital emergency room.  If you develope a fever above 101 F, pus (white drainage) or increased drainage or redness at the wound, or calf pain, call your surgeon's office.   Constipation Prevention   Complete by: As directed    Drink plenty of fluids.  Prune juice may be helpful.  You may use a stool softener, such as Colace (over the counter) 100 mg twice a day.  Use MiraLax (over the counter) for constipation as needed.   Diet - low sodium heart healthy   Complete by: As directed    Incentive spirometry RT   Complete by: As directed    Increase activity slowly as tolerated   Complete by: As directed    Post-operative opioid taper instructions:   Complete by: As directed    POST-OPERATIVE OPIOID TAPER INSTRUCTIONS: It is important to wean off of your opioid medication as soon as possible. If you do not need pain medication after your surgery it is ok to stop day one. Opioids include: Codeine, Hydrocodone(Norco, Vicodin), Oxycodone(Percocet, oxycontin) and hydromorphone amongst others.  Long term and even short term use of opiods can cause: Increased pain response Dependence Constipation Depression Respiratory depression And more.  Withdrawal symptoms can include Flu like symptoms Nausea, vomiting And more Techniques to manage these symptoms Hydrate well Eat regular healthy meals Stay  active Use relaxation techniques(deep breathing, meditating, yoga) Do Not substitute Alcohol to help with tapering If you have been on opioids for less than two weeks and do not have pain than it is ok to stop all together.  Plan to wean off of opioids This plan should start within one week post op of your joint replacement. Maintain the same interval or time between taking each dose and first decrease the dose.  Cut the total daily intake of opioids by one tablet each day Next start to increase the time between doses. The last dose that should be eliminated is the evening dose.         Allergies as of 04/26/2022   No Known Allergies      Medication List     STOP taking these medications    aspirin 81 MG tablet   naproxen 375 MG tablet Commonly known as: NAPROSYN   rosuvastatin 40 MG tablet Commonly known as: CRESTOR       TAKE these medications    docusate sodium 100 MG capsule Commonly known  as: Colace Take 1 capsule (100 mg total) by mouth 2 (two) times daily as needed for mild constipation.   famotidine 20 MG tablet Commonly known as: PEPCID Take 1 tablet (20 mg total) by mouth 2 (two) times daily.   ferrous sulfate 325 (65 FE) MG EC tablet Take 325 mg by mouth daily.   gabapentin 300 MG capsule Commonly known as: NEURONTIN Take 300 mg by mouth 3 (three) times daily.   metoprolol tartrate 25 MG tablet Commonly known as: LOPRESSOR Take 1/2 (one-half) tablet by mouth twice daily   nitroGLYCERIN 0.4 MG SL tablet Commonly known as: Nitrostat Place 1 tablet (0.4 mg total) under the tongue every 5 (five) minutes as needed for chest pain.   oxyCODONE 5 MG immediate release tablet Commonly known as: Oxy IR/ROXICODONE Take 1 tablet (5 mg total) by mouth every 4 (four) hours as needed for severe pain. What changed:  when to take this reasons to take this   polyethylene glycol 17 g packet Commonly known as: MIRALAX / GLYCOLAX Take 17 g by mouth daily.         Follow-up Information     Susa Day, MD Follow up in 2 week(s).   Specialty: Orthopedic Surgery Contact information: [address] [place] [address] [phone]                 Signed: Lacie Draft PA-C Orthopaedic Surgery 04/26/2022, 12:27 PM

## 2022-04-26 NOTE — Progress Notes (Signed)
Occupational Therapy Evaluation Patient Details Name: John Berry MRN: [id number] DOB: 06-Dec-1950 Today's Date: 04/26/2022   History of Present Illness 71 yo male revision microlumbar decompression L3-4 repair of dural tear PMH back surgery 2018, CAD, skin CA, diverticulosis, heart stent, HLD, HTN, OSA, trigeminal neuralgia 2011, achilles tendon repair x3, craniotomy 2011 nasal sinus surgery,   Clinical Impression   Patient evaluated by Occupational Therapy with no further acute OT needs identified. All education has been completed and the patient has no further questions. See below for any follow-up Occupational Therapy or equipment needs. OT to sign off. Thank you for referral.        Recommendations for follow up therapy are one component of a multi-disciplinary discharge planning process, led by the attending physician.  Recommendations may be updated based on patient status, additional functional criteria and insurance authorization.   Follow Up Recommendations  Outpatient OT    Assistance Recommended at Discharge Set up Supervision/Assistance  Patient can return home with the following A little help with walking and/or transfers;A little help with bathing/dressing/bathroom;Assist for transportation    Functional Status Assessment  Patient has had a recent decline in their functional status and demonstrates the ability to make significant improvements in function in a reasonable and predictable amount of time.  Equipment Recommendations  Other (comment) (RW)    Recommendations for Other Services       Precautions / Restrictions Precautions Precautions: Back Precaution Booklet Issued: No Precaution Comments: educated on back precautions with adls.      Mobility Bed Mobility Overal bed mobility: Needs Assistance Bed Mobility: Supine to Sit, Rolling Rolling: Min guard   Supine to sit: Min guard     General bed mobility comments: mod cues for sequence but  increased    Transfers Overall transfer level: Needs assistance Equipment used: Rolling walker (2 wheels) Transfers: Sit to/from Stand Sit to Stand: Min assist           General transfer comment: assist to steady during UE transition to RW from recliner.      Balance Overall balance assessment: Needs assistance Sitting-balance support: No upper extremity supported, Feet supported Sitting balance-Leahy Scale: Fair     Standing balance support: Bilateral upper extremity supported, Reliant on assistive device for balance Standing balance-Leahy Scale: Poor Standing balance comment: reliant on UE support                           ADL either performed or assessed with clinical judgement   ADL Overall ADL's : Needs assistance/impaired Eating/Feeding: Independent   Grooming: Wash/dry hands           Upper Body Dressing : Modified independent;With adaptive equipment;Sitting   Lower Body Dressing: Modified independent;With adaptive equipment;Adhering to back precautions;Sit to/from stand   Toilet Transfer: Minimal assistance;Rolling walker (2 wheels);BSC/3in1           Functional mobility during ADLs: Minimal assistance;Rolling walker (2 wheels) General ADL Comments: pt with a very forward posture in RW and needs cues for upright. pt increased fall risk with RW. recommend use of w/c with outpatient follow.     Vision Baseline Vision/History: 1 Wears glasses (reading)       Perception     Praxis      Pertinent Vitals/Pain Pain Assessment Pain Assessment: Faces Faces Pain Scale: Hurts little more Pain Location: back during transition to standing Pain Descriptors / Indicators: Discomfort, Grimacing Pain Intervention(s): Premedicated before session, Monitored during  session, Repositioned     Hand Dominance Right   Extremity/Trunk Assessment Upper Extremity Assessment Upper Extremity Assessment: Overall WFL for tasks assessed   Lower Extremity  Assessment Lower Extremity Assessment: Defer to PT evaluation   Cervical / Trunk Assessment Cervical / Trunk Assessment: Back Surgery   Communication Communication Communication: No difficulties   Cognition Arousal/Alertness: Awake/alert Behavior During Therapy: WFL for tasks assessed/performed Overall Cognitive Status: Within Functional Limits for tasks assessed                                       General Comments  dressing loose and RN called to room to address and fixed dressing at this time    Exercises     Shoulder Instructions      Home Living Family/patient expects to be discharged to:: Private residence Living Arrangements: Spouse/significant other Available Help at Discharge: Family;Available 24 hours/day Type of Home: House Home Access: Level entry (2in bump to get in the house)     Home Layout: One level     Bathroom Shower/Tub: Occupational psychologist: Handicapped height     Home Equipment: [place] - single point;Wheelchair - manual;Grab bars - tub/shower;Shower seat   Additional Comments: dog and cat- pt favors the Nurse, children's      Prior Functioning/Environment Prior Level of Function : Needs assist             Mobility Comments: has been using w/c for past 4 weeks due to pain. Performing squat pivots in/out of car and able to self propel w/c over 2 inch stoop to access home          OT Problem List: Decreased strength;Decreased activity tolerance;Impaired balance (sitting and/or standing);Decreased safety awareness      OT Treatment/Interventions: Self-care/ADL training;Therapeutic exercise;DME and/or AE instruction;Therapeutic activities;Balance training    OT Goals(Current goals can be found in the care plan section) Acute Rehab OT Goals Patient Stated Goal: to go home today OT Goal Formulation: With patient Time For Goal Achievement: 05/10/22 Potential to Achieve Goals: Good  OT Frequency: Min 2X/week     Co-evaluation              AM-PAC OT "6 Clicks" Daily Activity     Outcome Measure Help from another person eating meals?: None Help from another person taking care of personal grooming?: None Help from another person toileting, which includes using toliet, bedpan, or urinal?: None Help from another person bathing (including washing, rinsing, drying)?: A Little Help from another person to put on and taking off regular upper body clothing?: None Help from another person to put on and taking off regular lower body clothing?: A Little 6 Click Score: 22   End of Session Equipment Utilized During Treatment: Gait belt;Rolling walker (2 wheels) Nurse Communication: Mobility status;Precautions  Activity Tolerance: Patient tolerated treatment well Patient left: in chair;with call bell/phone within reach;Other (comment) (PT arriving for session)  OT Visit Diagnosis: Unsteadiness on feet (R26.81);Muscle weakness (generalized) (M62.81)                Time: 6160-7371 OT Time Calculation (min): 26 min Charges:  OT General Charges $OT Visit: 1 Visit OT Evaluation $OT Eval Moderate Complexity: 1 Mod   Brynn, OTR/L  Acute Rehabilitation Services Office: [phone] .   Jeri Modena 04/26/2022, 4:08 PM

## 2022-04-26 NOTE — Op Note (Signed)
NAME: John Berry, John Berry MEDICAL RECORD NO: [id number] ACCOUNT NO: [id number] DATE OF BIRTH: 1950-12-05 FACILITY: [facility] LOCATION: [facility] PHYSICIAN: Johnn Hai, MD  Operative Report   DATE OF PROCEDURE: 04/24/2022  PREOPERATIVE DIAGNOSIS:  Recurrent spinal stenosis at L3-L4, L4-L5.  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  PROCEDURES PERFORMED:  1.  Revision lumbar decompression at L3-L4 with takedown of extensive heterotopic bone at L3-L4. 2.  Repair of dural rent with Duragen patch and Dura-Guard patch, foraminotomies, L3-L4, left.  ASSISTANT:  Dr. Rolena Infante, [place], [place].  HISTORY:  This is a 71 year old with severe bilateral lower extremity radicular pain, numbness and neurogenic claudication secondary to severe spinal stenosis, recurrent at L3-L4.  MRI indicating severe multifactorial stenosis with ligamentum flavum  hypertrophy.  A small cyst from the right L3-L4 facet noted.  He had failed conservative treatment with difficulty ambulating and lower extremity weakness.  He was indicated for revision lumbar decompression.  Risks and benefits discussed including  bleeding, infection, damage to neurovascular structures, no change in symptoms, worsening symptoms, DVT, PE, anesthetic complications, CSF leakage, need for revision, inability to perform the decompression, etc.  DESCRIPTION OF PROCEDURE:  The patient was placed in supine position, after induction of adequate general anesthesia, 2 grams Kefzol, he was placed prone on a Wilson frame.  All bony prominences well padded.  Lumbar region was prepped and draped in the  usual sterile fashion.  Two 18-gauge spinal needles utilized to localize the L2 spinous process and the L4-L5 region.  The L3 spinous process was then excised.  This was confirmed with x-ray.  I made an incision in the previous surgical incision  extending from the spinous  process of L2 to and below 5.  Subcutaneous tissue was dissected.  Electrocautery was utilized to achieve hemostasis.  We found the dorsal lumbar fascia proximally, divided in line with the skin incision.  Paraspinous muscles  elevated from lamina of 2 and then I extended the division of the dorsal lumbar fascia in line with this, the depths of the division at L2 and extended it distally to approximately L4-L5.  I then beginning cephalad elevated the paraspinous musculature  along the plane that was established, right at the L2 lamina.  Noted just below the spinous process of 2 was extensive bone.  That continued caudad.  There was extensive bone.  I placed a self-retaining McCulloch retractors as there was no interlaminar  space at L2-L3. After McCulloch retractors were placed, I took an x-ray with a Kocher on the spinous processes of 2 and approximately at L5.  We continued with the dissection.  After the [facility] retractor was in place as we continued caudad, I used a  curette to skeletonize the heterotopic bone and extended distally.  The normal anatomy was obscured with this heterotopic bone.  As I continued caudad on the left, there was what appeared to be the hypertrophic facet of L3-L4.  There was a large superior  articulating process extending cephalad and a large inferior articulating process that extended distally.  I placed an instrument and what appeared to be the pars of 3, identifying the 3-4 space.  This confirmed that.  I used a curette to move the  facet.  So I could get the distal extent of the heterotopic bone.  This was obscuring the midline and the bony landmarks.  I then used a high-speed bur to enlarge the articulation between the  superior and inferior articulating process of L3-L4 on the  left.  Distally at the end of the heterotopic bone, there was scar tissue.  There was an avenue to place a 2 mm Kerrison in the safe zone at the bottom of the facet.  I removed some of the inferior  bone.  Using the midline as the spinous process of L2, I  began with a high-speed bur to remove the lamina, to remove the heterotopic bone, to debulk the heterotopic bone.  I then in the midline removed with a 2 mm Kerrison, starting at the inferior portion of the heterotopic bone, began removing bone  centrally and going cephalad, using a Woodson probe and a Penfield and neural patties.  The thickness of the heterotopic bone was approximately 2 cm.  After this, extending cephalad, there was ligamentum flavum noted centrally.  A central laminotomy,  decompressing the central thecal sac.  There was hypertrophic ligamentum flavum on the left.  With a Penfield 4, I developed a plane between the thecal sac and the ligamentum flavum.  I used a 2 mm Kerrison and removed the hypertrophic ligamentum.  I  also removed the bone ligamentum across the midline to the right.  As I decompressed the ligamentum flavum along the lateral recess on the left, the thecal sac expanded and into that, I noted CSF fluid distally.  I then placed a neuro patty on that and  then examined that distally.  There was an osteophyte or spike of bone from the previous laminotomy defect, it was slightly mobile.  It had created a rent in the dura.  I used some neural patties.  I isolated the bony spike and with a micro curette, was  able to remove that piece of bone. Prior to this, the takedown of the heterotopic bone was an extensive challenge.  When we got to the CSF fluid, when we remove the bone spur; as I was removing the bone spur, I had called my partner Dr. Rolena Infante to assist.   He was in the office and he would be available subsequently. I then proceeded with removing further the bone spur and placing neuro patties over the rent.  The rent appeared to be approximately 1 cm in length.  Dr. Rolena Infante arrived and he preferred to  work with his loops.  We therefore removed the microscope and Dr. Rolena Infante proceeded with a repair.  After isolating  the rent, we performed a foraminotomy of 4 and then fully decompressing the thecal sac and nerve root distally.  Following this, a Woodson  probe passed freely below the pedicle of 4, above the pedicle of 3 and centrally.  First after isolating the rent, we felt that the dura, which was fairly attenuated would not hold a side to side suture repair.  We felt the best construct would be a  patch graft.  So, first we reconstituted the patch we used the Duragen patch and placed a 2 x 1 cm patch over the dural rent.  Then, following this, we stitched in Dura-Guard of the Duragen patch, it was a 2.5 x 2.5 cm unit in starting.  We then used the  Dura-Guard Dura patch, removed it from its container and extirpated it for over 3 minutes in 500 mL of saline.  This was affixed to the non-attenuated portion of the thecal sac, 4 corners, 3 to the dura with 6-0 Prolene and then 1 corner of the patch to the  lateral ligamentum flavum that was still  present.  This provided a double layer of patch to the rent.  This fully covered the defect we felt.  Following this, after this, we then placed the dura glue on top of the patch and allowed it to sit for 5  minutes.  After this, we performed a Valsalva at 40.  There was no evidence of CSF leakage occurring.  We then placed a neuro patty over the area, removed the [facility] retractor.  We had placed in the [facility] retractor periodically throughout the  case.  We copiously irrigated the paraspinous musculature and the operative field.  We had irrigated it previously throughout the case.  Following this, bipolar cautery was utilized to achieve strict hemostasis.  I then used a Woodson probe and we  decided at this point in time that it would be best to leave the right side lateral recess given the time of the case and the attenuation of the dura that was encountered.  I placed a Woodson cephalad, it seemed like the central part of the dura was well  decompressed.  Next, again we  decided at this point in time, it was best to close.  After copious irrigation, I closed the dorsal lumbar fascia with #1 Ethibond interrupted figure-of-eight sutures and I oversewed it with a running #1 Vicryl.  I closed  the subcutaneous with interrupted 2-0s for a watertight closure and the fascia, subcutaneous with 2-0 and skin with subcuticular Monocryl.  Steri-Strips were applied.  A sterile dressing was applied.  The patient was dosed again intraoperatively with 2  additional grams of Kefzol. He was then placed supine on the hospital bed, extubated without difficulty and transported to the recovery room in satisfactory condition.  The patient tolerated the procedure well.  Assistant Dr. Myrtie Hawk and PA, Coralyn Mark.  Blood loss 150 mL.  Postoperatively, in the recovery room, the patient had good dorsiflexion and plantarflexion of the feet and sensation was intact.  No evidence of DVT.   MUK D: 04/25/2022 5:43:02 pm T: 04/26/2022 2:25:00 am  JOB: [id number]/ [id number]

## 2022-04-29 ENCOUNTER — Telehealth: Payer: Self-pay | Admitting: *Deleted

## 2022-04-29 ENCOUNTER — Encounter: Payer: Self-pay | Admitting: *Deleted

## 2022-04-29 NOTE — Patient Instructions (Signed)
Visit Information  Thank you for taking time to visit with me today. Please don't hesitate to contact me if I can be of assistance to you.   Following are the goals we discussed today:   Goals Addressed               This Visit's Progress     COMPLETED: Inquired if post-op PT services (pt-stated)        Care Coordination Interventions: Advised patient to contact his provider's office for a referral to a recommended outpatient PT agency as noted on his d/c instructions and PT evaluation. Verified pt was provided a rolling walker upon his recent discharge. Reviewed medications with patient and discussed adherence with no acute delays or needed refills Reviewed scheduled/upcoming provider appointments including pending appointments Screening for signs and symptoms of depression related to chronic disease state  Assessed social determinant of health barriers          Please call the care guide team at [phone] if you need to cancel or reschedule your appointment.   If you are experiencing a Mental Health or [place] or need someone to talk to, please call the Suicide and Crisis Lifeline: 988  Patient verbalizes understanding of instructions and care plan provided today and agrees to view in [place]. Active MyChart status and patient understanding of how to access instructions and care plan via MyChart confirmed with patient.     No further follow up required: No additional needs  Raina Mina, RN Care Management Coordinator [place] Office [phone]

## 2022-04-29 NOTE — Patient Outreach (Signed)
  Care Coordination   Initial Visit Note   04/29/2022 Name: CALEEL KINER MRN: [id number] DOB: Feb 25, 1951  ABDOUL ENCINAS is a 71 y.o. year old male who sees Aura Dials, MD for primary care. I spoke with  Alwyn Pea by phone today.  What matters to the patients health and wellness today?  Out pt PT services    Goals Addressed               This Visit's Progress     COMPLETED: Inquired if post-op PT services (pt-stated)        Care Coordination Interventions: Advised patient to contact his provider's office for a referral to a recommended outpatient PT agency as noted on his d/c instructions and PT evaluation. Verified pt was provided a rolling walker upon his recent discharge. Reviewed medications with patient and discussed adherence with no acute delays or needed refills Reviewed scheduled/upcoming provider appointments including pending appointments Screening for signs and symptoms of depression related to chronic disease state  Assessed social determinant of health barriers          SDOH assessments and interventions completed:  Yes  SDOH Interventions Today    Flowsheet Row Most Recent Value  SDOH Interventions   Food Insecurity Interventions Intervention Not Indicated  Housing Interventions Intervention Not Indicated  Transportation Interventions Intervention Not Indicated  Utilities Interventions Intervention Not Indicated        Care Coordination Interventions Activated:  Yes  Care Coordination Interventions:  Yes, provided   Follow up plan: No further intervention required.   Encounter Outcome:  Pt. Visit Completed   Raina Mina, RN Care Management Coordinator [place] Office [phone]

## 2022-05-03 ENCOUNTER — Encounter: Payer: Self-pay | Admitting: Internal Medicine

## 2022-05-15 ENCOUNTER — Encounter: Payer: Self-pay | Admitting: Internal Medicine

## 2022-05-15 NOTE — Progress Notes (Signed)
Cardiology Clinic Note   Patient Name: John Berry Date of Encounter: 05/17/2022  Primary Care Provider:  Aura Dials, MD Primary Cardiologist:  Kirk Ruths, MD  Patient Profile    John Berry 71 year old male presents to the clinic today for follow-up evaluation of his coronary artery disease, hyperlipidemia, and SVT.  Past Medical History    Past Medical History:  Diagnosis Date   CAD (coronary artery disease)    s/p Evolve study stent mid RCA 12/13/11   Cancer ([place])    hx of skin cancer    Diverticulosis    History of heart artery stent    Hyperlipidemia    Hypertension    OSA (obstructive sleep apnea)    surgery in 2011    Personal history of colonic polyps - adenomas 08/13/2010   TUBULAR ADENOMAS (X4)   Pre-diabetes    Trigeminal neuralgia 09/2009   Microvascular decompression   Past Surgical History:  Procedure Laterality Date   ACHILLES TENDON REPAIR     x 3   BACK SURGERY  2005   BILATERAL CARPAL TUNNEL RELEASE     COLONOSCOPY  08/13/2010   CORONARY ANGIOPLASTY  12/13/2011   s/p Evolve study stent mid RCA 12/13/11   CRANIOTOMY  10/25/2009   right suboccipital craniotomy for microvascular decompression for trigeminal neuralgia   ELECTROPHYSIOLOGY STUDY N/A 12/16/2011   Procedure: ELECTROPHYSIOLOGY STUDY;  Surgeon: Evans Lance, MD;  Location[facility] C[facility] LAB;  Service: Cardiovascular;  Laterality: N/A;   LEFT HEART CATHETERIZATION WITH CORONARY ANGIOGRAM N/A 12/13/2011   PTCA Procedure:  Surgeon: Hillary Bow, MD;  Location: [facility] CATH LAB;  Service: Cardiovascular;  Laterality: N/A;   LEFT HEART CATHETERIZATION WITH CORONARY ANGIOGRAM N/A 12/16/2011   Procedure: LEFT HEART CATHETERIZATION WITH CORONARY ANGIOGRAM;  Surgeon: Thayer Headings, MD;  Location: [facility] CATH LAB;  Service: Cardiovascular;  Laterality: N/A;   LUMBAR LAMINECTOMY/DECOMPRESSION MICRODISCECTOMY N/A 04/30/2017   Procedure: Microlumbar decompression L4-5, L3-4, L2-3,  lateral mass fusion L4-5 with autograft/allograft bone;  Surgeon: Susa Day, MD;  Location: [facility] ORS;  Service: Orthopedics;  Laterality: N/A;   LUMBAR LAMINECTOMY/DECOMPRESSION MICRODISCECTOMY N/A 04/24/2022   Procedure: Revision microlumbar decompression Lumbar three -four;  Surgeon: Susa Day, MD;  Location: [place];  Service: Orthopedics;  Laterality: N/A;  2.5 hrs 3C-Bed   NASAL SINUS SURGERY     SPINE SURGERY     lumbar x 2   UVULOPALATOPHARYNGOPLASTY  08/21/2006    Allergies  No Known Allergies  History of Present Illness    John Berry has a PMH of coronary artery disease, SVT, syncope, hyperlipidemia, colon polyps, and lumbar spinal stenosis.  His nuclear stress test 6/13 showed an EF of 51%.  There was noted to be diaphragmatic attenuation and possible inferior ischemia.  He underwent cardiac catheterization 6/13 which showed no disease in his left main, 70% LAD, 80-90% septal, and a small diagonal lesion with 90% stenosis.  He was noted to have 80% mid RCA and 70% mid PDA lesion.  He received PCI to his RCA.  His LAD disease was treated medically.  His EF was preserved.  He had an EP study for unexplained syncope 6/13.  He did not have any inducible ventricular tachycardia or SVT.  His exercise stress test 3/16 was negative.  MRA of his abdomen 12/18 showed no AAA.  His ABIs 1/22 were normal.  He was seen in follow-up by Dr. Stanford Breed on 07/12/2021.  During that time he denied increased dyspnea  on exertion, orthopnea, PND, lower extremity swelling, chest pain and syncope.  He underwent revision of microlumbar decompression L3-4 and repair of dural tear.  He was admitted on 04/24/2022 and discharged on 04/26/2022.  He presents to the clinic today for follow-up evaluation and states he is recovering well from his back surgery.  He indicates that his scar tissue from previous surgery was pressing on his spinal cord and he lost the ability to walk.  He is now able to walk at  home with a cane and is continuing to progress his physical activity.  He was taken off of his rosuvastatin by his neurosurgeon.  He reports arthralgias with 40 mg dose of rosuvastatin.  He was tolerating 20 mg well.  We reviewed his previous cardiac catheterization recent stress test.  He expressed understanding.  I reviewed the importance of heart healthy low-sodium high-fiber diet, will restart his rosuvastatin 20 mg, plan for repeat fasting lipids and LFTs in February, and plan follow-up in 9 to 12 months.  Today he denies chest pain, shortness of breath, lower extremity edema, fatigue, palpitations, melena, hematuria, hemoptysis, diaphoresis, weakness, presyncope, syncope, orthopnea, and PND.   Home Medications    Prior to Admission medications   Medication Sig Start Date End Date Taking? Authorizing Provider  docusate sodium (COLACE) 100 MG capsule Take 1 capsule (100 mg total) by mouth 2 (two) times daily as needed for mild constipation. 04/24/22   Susa Day, MD  famotidine (PEPCID) 20 MG tablet Take 1 tablet (20 mg total) by mouth 2 (two) times daily. Patient not taking: Reported on 04/17/2022 03/30/22   Fatima Blank, MD  ferrous sulfate 325 (65 FE) MG EC tablet Take 325 mg by mouth daily.    [provider]  gabapentin (NEURONTIN) 300 MG capsule Take 300 mg by mouth 3 (three) times daily.    [provider]  metoprolol tartrate (LOPRESSOR) 25 MG tablet Take 1/2 (one-half) tablet by mouth twice daily 10/30/21   Lelon Perla, MD  nitroGLYCERIN (NITROSTAT) 0.4 MG SL tablet Place 1 tablet (0.4 mg total) under the tongue every 5 (five) minutes as needed for chest pain. Patient not taking: Reported on 04/17/2022 01/21/19   Lelon Perla, MD  oxyCODONE (OXY IR/ROXICODONE) 5 MG immediate release tablet Take 1 tablet (5 mg total) by mouth every 4 (four) hours as needed for severe pain. 04/24/22   Susa Day, MD  polyethylene glycol (MIRALAX / GLYCOLAX) 17 g  packet Take 17 g by mouth daily. 04/24/22   Susa Day, MD    Family History    Family History  Problem Relation Age of Onset   Hypertension Mother    Heart disease Mother 51   Hyperlipidemia Mother    Pancreatic cancer Father    Heart disease Brother 64   Colon cancer Neg Hx    Esophageal cancer Neg Hx    Prostate cancer Neg Hx    Rectal cancer Neg Hx    Stomach cancer Neg Hx    He indicated that his mother is deceased. He indicated that his father is deceased. He indicated that both of his sisters are alive. He indicated that only one of his two brothers is alive. He indicated that his maternal grandmother is deceased. He indicated that his maternal grandfather is deceased. He indicated that his paternal grandmother is deceased. He indicated that his paternal grandfather is deceased. He indicated that all of his three daughters are alive. He indicated that the status of  his neg hx is unknown.  Social History    Social History   Socioeconomic History   Marital status: Married    Spouse name: Jenny Reichmann   Number of children: 3   Years of education: Not on file   Highest education level: Not on file  Occupational History   Occupation: YOUTH PASTOR    Employer: [employer]  Tobacco Use   Smoking status: Never   Smokeless tobacco: Never  Vaping Use   Vaping Use: Never used  Substance and Sexual Activity   Alcohol use: No   Drug use: No   Sexual activity: Yes  Other Topics Concern   Not on file  Social History Narrative   Lives in [place], [place] with wife.    Exercise: No   Social Determinants of Health   Financial Resource Strain: Not on file  Food Insecurity: No Food Insecurity (04/29/2022)   Hunger Vital Sign    Worried About Running Out of Food in the Last Year: Never true    Ran Out of Food in the Last Year: Never true  Transportation Needs: No Transportation Needs (04/29/2022)   PRAPARE - Hydrologist (Medical): No    Lack of  Transportation (Non-Medical): No  Physical Activity: Not on file  Stress: Not on file  Social Connections: Not on file  Intimate Partner Violence: Not on file     Review of Systems    General:  No chills, fever, night sweats or weight changes.  Cardiovascular:  No chest pain, dyspnea on exertion, edema, orthopnea, palpitations, paroxysmal nocturnal dyspnea. Dermatological: No rash, lesions/masses Respiratory: No cough, dyspnea Urologic: No hematuria, dysuria Abdominal:   No nausea, vomiting, diarrhea, bright red blood per rectum, melena, or hematemesis Neurologic:  No visual changes, wkns, changes in mental status. All other systems reviewed and are otherwise negative except as noted above.  Physical Exam    VS:  BP 132/72 (BP Location: Left Arm, Patient Position: Sitting, Cuff Size: Large)   Pulse 81   Ht '5\' 11"'$  (1.803 m)   Wt 220 lb 6.4 oz (100 kg)   SpO2 96%   BMI 30.74 kg/m  , BMI Body mass index is 30.74 kg/m. GEN: Well nourished, well developed, in no acute distress. HEENT: normal. Neck: Supple, no JVD, carotid bruits, or masses. Cardiac: RRR, no murmurs, rubs, or gallops. No clubbing, cyanosis, edema.  Radials/DP/PT 2+ and equal bilaterally.  Respiratory:  Respirations regular and unlabored, clear to auscultation bilaterally. GI: Soft, nontender, nondistended, BS + x 4. MS: no deformity or atrophy. Skin: warm and dry, no rash. Neuro:  Strength and sensation are intact.  Bilateral foot numbness Psych: Normal affect.  Accessory Clinical Findings    Recent Labs: 03/29/2022: Magnesium 1.9 04/25/2022: ALT 21; BUN 15; Creatinine, Ser 1.05; Hemoglobin 12.3; Platelets 224; Potassium 4.2; Sodium 138   Recent Lipid Panel    Component Value Date/Time   CHOL 115 09/20/2020 0000   TRIG 78 09/20/2020 0000   HDL 47 09/20/2020 0000   CHOLHDL 2.4 09/20/2020 0000   CHOLHDL 3.0 09/09/2016 0843   VLDL 17 09/09/2016 0843   LDLCALC 52 09/20/2020 0000         ECG personally  reviewed by me today-normal sinus rhythm left axis deviation anterior MI undetermined age 63 bpm- No acute changes  Nuclear stress test 07/19/2021    The study is normal. The study is low risk.   No ST deviation was noted.   Left ventricular function  is normal. Nuclear stress EF: 50 %. The left ventricular ejection fraction is mildly decreased (45-54%). End diastolic cavity size is normal.   Prior study available for comparison from 12/12/2011.   Moderate inferior defect appears worse at rest then stress consistent with attenuation artifact  Assessment & Plan   1.  Coronary artery disease-no recent episodes of arm neck back or chest discomfort.  Exercise stress testing 3/16 was negative.  Nuclear stress test 07/19/2021 showed an EF of 50% and no ischemia. Continue metoprolol, nitroglycerin as needed Heart healthy low-sodium diet-salty 6 given Increase physical activity as tolerated  Hyperlipidemia-LDL 50 on 06/06/21.  Noticed arthralgias with 40 mg of Crestor.  Previously tolerated 20 mg of rosuvastatin well. Restart rosuvastatin 20 mg daily Heart healthy low-sodium high-fiber diet.   Increase physical activity as tolerated Repeat fasting lipids and lfts in February.  PVD-denies claudication.  Does note some foot numbness.  Is walking again with a cane post back surgery. Increase physical activity as tolerated Continue current medical therapy  Dyspnea on exertion-progressing well postoperatively denies DOE. Increase physical activity as tolerated Continue to monitor  Disposition: Follow-up with Dr. Stanford Breed in 9-12 months.   Jossie Ng. Jordan Pardini NP-C     05/17/2022, 11:04 AM [place] [address] Office [phone] Fax [phone]    I spent 15 minutes examining this patient, reviewing medications, and using patient centered shared decision making involving her cardiac care.  Prior to her visit I spent greater than 20 minutes  reviewing her past medical history,  medications, and prior cardiac tests.

## 2022-05-17 ENCOUNTER — Encounter: Payer: Self-pay | Admitting: General Practice

## 2022-05-17 ENCOUNTER — Ambulatory Visit: Payer: Medicare Other | Attending: Cardiology | Admitting: General Practice

## 2022-05-17 VITALS — BP 132/72 | HR 81 | Ht 71.0 in | Wt 220.4 lb

## 2022-05-17 DIAGNOSIS — I2510 Atherosclerotic heart disease of native coronary artery without angina pectoris: Secondary | ICD-10-CM

## 2022-05-17 DIAGNOSIS — R0609 Other forms of dyspnea: Secondary | ICD-10-CM | POA: Diagnosis not present

## 2022-05-17 DIAGNOSIS — R200 Anesthesia of skin: Secondary | ICD-10-CM | POA: Diagnosis not present

## 2022-05-17 DIAGNOSIS — E7800 Pure hypercholesterolemia, unspecified: Secondary | ICD-10-CM | POA: Diagnosis not present

## 2022-05-17 MED ORDER — ROSUVASTATIN CALCIUM 20 MG PO TABS: 20.0000 mg | ORAL_TABLET | Freq: Every day | ORAL | 3 refills | Status: DC

## 2022-05-17 NOTE — Patient Instructions (Signed)
Medication Instructions:  RESTART ROSUVASTATIN '20MG'$   *If you need a refill on your cardiac medications before your next appointment, please call your pharmacy*   Lab Work: FASTING LIPID AND LFT IN FEBRUARY 2024  If you have labs (blood work) drawn today and your tests are completely normal, you will receive your results only by:  [place] (if you have MyChart) OR  A paper copy in the mail  If you have any lab test that is abnormal or we need to change your treatment, we will call you to review the results.  Testing/Procedures: NONE  Follow-Up: At [facility], you and your health needs are our priority.  As part of our continuing mission to provide you with exceptional heart care, we have created designated Provider Care Teams.  These Care Teams include your primary Cardiologist (physician) and Advanced Practice Providers (APPs -  Physician Assistants and Nurse Practitioners) who all work together to provide you with the care you need, when you need it.  Your next appointment:   9-12 month(s)  The format for your next appointment:   In Person  Provider:   Kirk Ruths, MD    Other Instructions PLEASE READ AND FOLLOW ATTACHED  SALTY 6  INCREASE PHYSICAL ACTIVITY AS TOLERATED  PLEASE READ AND FOLLOW INCREASED FIBER DIET  Important Information About Sugar         High-Fiber Eating Plan Fiber, also called dietary fiber, is a type of carbohydrate. It is found foods such as fruits, vegetables, whole grains, and beans. A high-fiber diet can have many health benefits. Your health care provider may recommend a high-fiber diet to help: Prevent constipation. Fiber can make your bowel movements more regular. Lower your cholesterol. Relieve the following conditions: Inflammation of veins in the anus (hemorrhoids). Inflammation of specific areas of the digestive tract (uncomplicated diverticulosis). A problem of the large intestine, also called the colon, that  sometimes causes pain and diarrhea (irritable bowel syndrome, or IBS). Prevent overeating as part of a weight-loss plan. Prevent heart disease, type 2 diabetes, and certain cancers. What are tips for following this plan? Reading food labels  Check the nutrition facts label on food products for the amount of dietary fiber. Choose foods that have 5 grams of fiber or more per serving. The goals for recommended daily fiber intake include: Men (age 8 or younger): 34-38 g. Men (over age 88): 28-34 g. Women (age 63 or younger): 25-28 g. Women (over age 13): 22-25 g. Your daily fiber goal is _____________ g. Shopping Choose whole fruits and vegetables instead of processed forms, such as apple juice or applesauce. Choose a wide variety of high-fiber foods such as avocados, lentils, oats, and kidney beans. Read the nutrition facts label of the foods you choose. Be aware of foods with added fiber. These foods often have high sugar and sodium amounts per serving. Cooking Use whole-grain flour for baking and cooking. Cook with brown rice instead of white rice. Meal planning Start the day with a breakfast that is high in fiber, such as a cereal that contains 5 g of fiber or more per serving. Eat breads and cereals that are made with whole-grain flour instead of refined flour or white flour. Eat brown rice, bulgur wheat, or millet instead of white rice. Use beans in place of meat in soups, salads, and pasta dishes. Be sure that half of the grains you eat each day are whole grains. General information You can get the recommended daily intake of dietary  fiber by: Eating a variety of fruits, vegetables, grains, nuts, and beans. Taking a fiber supplement if you are not able to take in enough fiber in your diet. It is better to get fiber through food than from a supplement. Gradually increase how much fiber you consume. If you increase your intake of dietary fiber too quickly, you may have bloating,  cramping, or gas. Drink plenty of water to help you digest fiber. Choose high-fiber snacks, such as berries, raw vegetables, nuts, and popcorn. What foods should I eat? Fruits Berries. Pears. Apples. Oranges. Avocado. Prunes and raisins. Dried figs. Vegetables Sweet potatoes. Spinach. Kale. Artichokes. Cabbage. Broccoli. Cauliflower. Green peas. Carrots. Squash. Grains Whole-grain breads. Multigrain cereal. Oats and oatmeal. Brown rice. Barley. Bulgur wheat. [place]. Quinoa. Bran muffins. Popcorn. Rye wafer crackers. Meats and other proteins Navy beans, kidney beans, and pinto beans. Soybeans. Split peas. Lentils. Nuts and seeds. Dairy Fiber-fortified yogurt. Beverages Fiber-fortified soy milk. Fiber-fortified orange juice. Other foods Fiber bars. The items listed above may not be a complete list of recommended foods and beverages. Contact a dietitian for more information. What foods should I avoid? Fruits Fruit juice. Cooked, strained fruit. Vegetables Fried potatoes. Canned vegetables. Well-cooked vegetables. Grains White bread. Pasta made with refined flour. White rice. Meats and other proteins Fatty cuts of meat. Fried chicken or fried fish. Dairy Milk. Yogurt. Cream cheese. Sour cream. Fats and oils Butters. Beverages Soft drinks. Other foods Cakes and pastries. The items listed above may not be a complete list of foods and beverages to avoid. Talk with your dietitian about what choices are best for you. Summary Fiber is a type of carbohydrate. It is found in foods such as fruits, vegetables, whole grains, and beans. A high-fiber diet has many benefits. It can help to prevent constipation, lower blood cholesterol, aid weight loss, and reduce your risk of heart disease, diabetes, and certain cancers. Increase your intake of fiber gradually. Increasing fiber too quickly may cause cramping, bloating, and gas. Drink plenty of water while you increase the amount of fiber you  consume. The best sources of fiber include whole fruits and vegetables, whole grains, nuts, seeds, and beans. This information is not intended to replace advice given to you by your health care provider. Make sure you discuss any questions you have with your health care provider. Document Revised: 10/21/2019 Document Reviewed: 10/21/2019 Elsevier Patient Education  [place].

## 2022-05-21 NOTE — Addendum Note (Signed)
Addended by: Brantley Persons A on: 05/21/2022 07:40 AM   Modules accepted: Orders

## 2022-06-03 DIAGNOSIS — L738 Other specified follicular disorders: Secondary | ICD-10-CM | POA: Diagnosis not present

## 2022-06-03 DIAGNOSIS — L821 Other seborrheic keratosis: Secondary | ICD-10-CM | POA: Diagnosis not present

## 2022-06-03 DIAGNOSIS — Z85820 Personal history of malignant melanoma of skin: Secondary | ICD-10-CM | POA: Diagnosis not present

## 2022-06-03 DIAGNOSIS — L570 Actinic keratosis: Secondary | ICD-10-CM | POA: Diagnosis not present

## 2022-06-07 DIAGNOSIS — Z0000 Encounter for general adult medical examination without abnormal findings: Secondary | ICD-10-CM | POA: Diagnosis not present

## 2022-06-07 DIAGNOSIS — D126 Benign neoplasm of colon, unspecified: Secondary | ICD-10-CM | POA: Diagnosis not present

## 2022-06-07 DIAGNOSIS — Z125 Encounter for screening for malignant neoplasm of prostate: Secondary | ICD-10-CM | POA: Diagnosis not present

## 2022-06-07 DIAGNOSIS — E782 Mixed hyperlipidemia: Secondary | ICD-10-CM | POA: Diagnosis not present

## 2022-06-26 ENCOUNTER — Encounter: Payer: Medicare Other | Admitting: Internal Medicine

## 2022-07-09 ENCOUNTER — Ambulatory Visit (AMBULATORY_SURGERY_CENTER): Payer: BLUE CROSS/BLUE SHIELD | Admitting: *Deleted

## 2022-07-09 VITALS — Ht 71.0 in | Wt 211.0 lb

## 2022-07-09 DIAGNOSIS — Z86010 Personal history of colonic polyps: Secondary | ICD-10-CM

## 2022-07-09 NOTE — Progress Notes (Signed)
Pt hasn't taken Nitro in 10 years  No egg or soy allergy known to patient  No issues known to pt with past sedation with any surgeries or procedures Patient denies ever being told they had issues or difficulty with intubation  No FH of Malignant Hyperthermia Pt is not on diet pills Pt is not on  home 02  Pt is not on blood thinners  Pt denies issues with constipation  Pt is not on dialysis Pt denies any upcoming cardiac testing Pt encouraged to use to use Singlecare or Goodrx to reduce cost  Patient's chart reviewed by Osvaldo Angst CNRA prior to previsit and patient appropriate for the [place].  Previsit completed and red dot placed by patient's name on their procedure day (on provider's schedule).

## 2022-07-16 DIAGNOSIS — K080 Exfoliation of teeth due to systemic causes: Secondary | ICD-10-CM | POA: Diagnosis not present

## 2022-07-18 DIAGNOSIS — M5451 Vertebrogenic low back pain: Secondary | ICD-10-CM | POA: Diagnosis not present

## 2022-07-24 ENCOUNTER — Encounter: Payer: Self-pay | Admitting: Internal Medicine

## 2022-07-26 DIAGNOSIS — M545 Low back pain, unspecified: Secondary | ICD-10-CM | POA: Diagnosis not present

## 2022-07-31 ENCOUNTER — Encounter: Payer: Medicare Other | Admitting: Internal Medicine

## 2022-08-13 DIAGNOSIS — M48062 Spinal stenosis, lumbar region with neurogenic claudication: Secondary | ICD-10-CM | POA: Diagnosis not present

## 2022-09-16 DIAGNOSIS — M4316 Spondylolisthesis, lumbar region: Secondary | ICD-10-CM | POA: Diagnosis not present

## 2022-09-16 DIAGNOSIS — Z7982 Long term (current) use of aspirin: Secondary | ICD-10-CM | POA: Diagnosis not present

## 2022-09-16 DIAGNOSIS — E669 Obesity, unspecified: Secondary | ICD-10-CM | POA: Diagnosis not present

## 2022-09-16 DIAGNOSIS — G4733 Obstructive sleep apnea (adult) (pediatric): Secondary | ICD-10-CM | POA: Diagnosis not present

## 2022-09-16 DIAGNOSIS — M48061 Spinal stenosis, lumbar region without neurogenic claudication: Secondary | ICD-10-CM | POA: Diagnosis not present

## 2022-09-16 DIAGNOSIS — Z981 Arthrodesis status: Secondary | ICD-10-CM | POA: Diagnosis not present

## 2022-09-16 DIAGNOSIS — E785 Hyperlipidemia, unspecified: Secondary | ICD-10-CM | POA: Diagnosis not present

## 2022-09-16 DIAGNOSIS — Z79899 Other long term (current) drug therapy: Secondary | ICD-10-CM | POA: Diagnosis not present

## 2022-09-16 DIAGNOSIS — I2510 Atherosclerotic heart disease of native coronary artery without angina pectoris: Secondary | ICD-10-CM | POA: Diagnosis not present

## 2022-09-16 DIAGNOSIS — G500 Trigeminal neuralgia: Secondary | ICD-10-CM | POA: Diagnosis not present

## 2022-09-16 DIAGNOSIS — Z6830 Body mass index (BMI) 30.0-30.9, adult: Secondary | ICD-10-CM | POA: Diagnosis not present

## 2022-09-16 DIAGNOSIS — Z955 Presence of coronary angioplasty implant and graft: Secondary | ICD-10-CM | POA: Diagnosis not present

## 2022-09-16 DIAGNOSIS — G4730 Sleep apnea, unspecified: Secondary | ICD-10-CM | POA: Diagnosis not present

## 2022-09-17 DIAGNOSIS — M48061 Spinal stenosis, lumbar region without neurogenic claudication: Secondary | ICD-10-CM | POA: Diagnosis not present

## 2022-09-17 DIAGNOSIS — E785 Hyperlipidemia, unspecified: Secondary | ICD-10-CM | POA: Diagnosis not present

## 2022-09-17 DIAGNOSIS — G4733 Obstructive sleep apnea (adult) (pediatric): Secondary | ICD-10-CM | POA: Diagnosis not present

## 2022-09-17 DIAGNOSIS — Z7982 Long term (current) use of aspirin: Secondary | ICD-10-CM | POA: Diagnosis not present

## 2022-09-17 DIAGNOSIS — Z79899 Other long term (current) drug therapy: Secondary | ICD-10-CM | POA: Diagnosis not present

## 2022-09-17 DIAGNOSIS — E669 Obesity, unspecified: Secondary | ICD-10-CM | POA: Diagnosis not present

## 2022-09-17 DIAGNOSIS — M4316 Spondylolisthesis, lumbar region: Secondary | ICD-10-CM | POA: Diagnosis not present

## 2022-09-17 DIAGNOSIS — G500 Trigeminal neuralgia: Secondary | ICD-10-CM | POA: Diagnosis not present

## 2022-09-17 DIAGNOSIS — I2510 Atherosclerotic heart disease of native coronary artery without angina pectoris: Secondary | ICD-10-CM | POA: Diagnosis not present

## 2022-09-17 DIAGNOSIS — Z6830 Body mass index (BMI) 30.0-30.9, adult: Secondary | ICD-10-CM | POA: Diagnosis not present

## 2022-09-17 DIAGNOSIS — Z955 Presence of coronary angioplasty implant and graft: Secondary | ICD-10-CM | POA: Diagnosis not present

## 2022-09-18 DIAGNOSIS — Z955 Presence of coronary angioplasty implant and graft: Secondary | ICD-10-CM | POA: Diagnosis not present

## 2022-09-18 DIAGNOSIS — Z6830 Body mass index (BMI) 30.0-30.9, adult: Secondary | ICD-10-CM | POA: Diagnosis not present

## 2022-09-18 DIAGNOSIS — E669 Obesity, unspecified: Secondary | ICD-10-CM | POA: Diagnosis not present

## 2022-09-18 DIAGNOSIS — M4316 Spondylolisthesis, lumbar region: Secondary | ICD-10-CM | POA: Diagnosis not present

## 2022-09-18 DIAGNOSIS — G500 Trigeminal neuralgia: Secondary | ICD-10-CM | POA: Diagnosis not present

## 2022-09-18 DIAGNOSIS — M48061 Spinal stenosis, lumbar region without neurogenic claudication: Secondary | ICD-10-CM | POA: Diagnosis not present

## 2022-09-18 DIAGNOSIS — Z7982 Long term (current) use of aspirin: Secondary | ICD-10-CM | POA: Diagnosis not present

## 2022-09-18 DIAGNOSIS — E785 Hyperlipidemia, unspecified: Secondary | ICD-10-CM | POA: Diagnosis not present

## 2022-09-18 DIAGNOSIS — G4733 Obstructive sleep apnea (adult) (pediatric): Secondary | ICD-10-CM | POA: Diagnosis not present

## 2022-09-18 DIAGNOSIS — Z79899 Other long term (current) drug therapy: Secondary | ICD-10-CM | POA: Diagnosis not present

## 2022-09-18 DIAGNOSIS — I2510 Atherosclerotic heart disease of native coronary artery without angina pectoris: Secondary | ICD-10-CM | POA: Diagnosis not present

## 2022-10-08 DIAGNOSIS — Z981 Arthrodesis status: Secondary | ICD-10-CM | POA: Diagnosis not present

## 2022-10-24 ENCOUNTER — Other Ambulatory Visit: Payer: Self-pay | Admitting: Cardiology

## 2022-10-24 DIAGNOSIS — M7022 Olecranon bursitis, left elbow: Secondary | ICD-10-CM | POA: Diagnosis not present

## 2022-10-24 DIAGNOSIS — M25522 Pain in left elbow: Secondary | ICD-10-CM | POA: Insufficient documentation

## 2022-12-03 DIAGNOSIS — M25522 Pain in left elbow: Secondary | ICD-10-CM | POA: Diagnosis not present

## 2023-01-21 DIAGNOSIS — K080 Exfoliation of teeth due to systemic causes: Secondary | ICD-10-CM | POA: Diagnosis not present

## 2023-01-22 ENCOUNTER — Ambulatory Visit (AMBULATORY_SURGERY_CENTER): Payer: Medicare Other | Admitting: *Deleted

## 2023-01-22 VITALS — Ht 71.0 in | Wt 215.0 lb

## 2023-01-22 DIAGNOSIS — Z86010 Personal history of colonic polyps: Secondary | ICD-10-CM

## 2023-01-22 DIAGNOSIS — R7303 Prediabetes: Secondary | ICD-10-CM

## 2023-01-22 NOTE — Progress Notes (Signed)
Pt's name and DOB verified at the beginning of the pre-visit.  Pt denies any difficulty with ambulating,sitting, laying down or rolling side to side Gave both [facility] main # and MD on call # prior to instructions.  No egg or soy allergy known to patient  No issues known to pt with past sedation with any surgeries or procedures Pt denies having issues being intubated Pt has no issues moving head neck or swallowing No FH of Malignant Hyperthermia Pt is not on diet pills Pt is not on home 02  Pt is not on blood thinners  Pt denies issues with constipation  Pt is not on dialysis Pt denise any abnormal heart rhythms  Pt denies any upcoming cardiac testing Pt encouraged to use to use Singlecare or Goodrx to reduce cost  Patient's chart reviewed by John Berry CNRA prior to pre-visit and patient appropriate for the [facility].  Pre-visit completed and red dot placed by patient's name on their procedure day (on provider's schedule).  . Visit by phone Pt states weight is 215 lb Instructed pt why it is important to and  to call if they have any changes in health or new medications. Directed them to the # given and on instructions.   Pt states they will.  Instructions reviewed with pt and pt states understanding. Instructed to review again prior to procedure. Pt states they will.  Instructions sent by mail with coupon and by my chart

## 2023-01-23 DIAGNOSIS — K080 Exfoliation of teeth due to systemic causes: Secondary | ICD-10-CM | POA: Diagnosis not present

## 2023-02-02 DIAGNOSIS — G500 Trigeminal neuralgia: Secondary | ICD-10-CM | POA: Diagnosis not present

## 2023-02-11 ENCOUNTER — Encounter: Payer: Self-pay | Admitting: Internal Medicine

## 2023-02-11 DIAGNOSIS — M792 Neuralgia and neuritis, unspecified: Secondary | ICD-10-CM | POA: Diagnosis not present

## 2023-02-18 DIAGNOSIS — K080 Exfoliation of teeth due to systemic causes: Secondary | ICD-10-CM | POA: Diagnosis not present

## 2023-02-23 ENCOUNTER — Encounter: Payer: Self-pay | Admitting: Certified Registered Nurse Anesthetist

## 2023-02-26 ENCOUNTER — Ambulatory Visit (AMBULATORY_SURGERY_CENTER): Payer: Medicare Other | Admitting: Internal Medicine

## 2023-02-26 ENCOUNTER — Encounter: Payer: Self-pay | Admitting: Internal Medicine

## 2023-02-26 VITALS — BP 132/83 | HR 51 | Temp 97.8°F | Resp 12 | Ht 71.0 in | Wt 215.0 lb

## 2023-02-26 DIAGNOSIS — Z86010 Personal history of colonic polyps: Secondary | ICD-10-CM | POA: Diagnosis not present

## 2023-02-26 DIAGNOSIS — D124 Benign neoplasm of descending colon: Secondary | ICD-10-CM | POA: Diagnosis not present

## 2023-02-26 DIAGNOSIS — Z09 Encounter for follow-up examination after completed treatment for conditions other than malignant neoplasm: Secondary | ICD-10-CM | POA: Diagnosis not present

## 2023-02-26 DIAGNOSIS — D125 Benign neoplasm of sigmoid colon: Secondary | ICD-10-CM

## 2023-02-26 MED ORDER — SODIUM CHLORIDE 0.9 % IV SOLN: 500.0000 mL | Freq: Once | INTRAVENOUS | Status: DC

## 2023-02-26 NOTE — Progress Notes (Signed)
Report given to PACU, vss 

## 2023-02-26 NOTE — Op Note (Signed)
[facility] Patient Name: John Berry Procedure Date: 02/26/2023 1:43 PM MRN: [id number] Endoscopist: Iva Boop , MD, [id number] Age: 72 Referring MD:  Date of Birth: [birth year] Gender: Male Account #: [id number] Procedure:                Colonoscopy Indications:              Surveillance: Personal history of adenomatous                            polyps on last colonoscopy > 5 years ago, Last                            colonoscopy: 2018 Medicines:                Monitored Anesthesia Care Procedure:                Pre-Anesthesia Assessment:                           - Prior to the procedure, a History and Physical                            was performed, and patient medications and                            allergies were reviewed. The patient's tolerance of                            previous anesthesia was also reviewed. The risks                            and benefits of the procedure and the sedation                            options and risks were discussed with the patient.                            All questions were answered, and informed consent                            was obtained. Prior Anticoagulants: The patient has                            taken no anticoagulant or antiplatelet agents. ASA                            Grade Assessment: III - A patient with severe                            systemic disease. After reviewing the risks and                            benefits, the patient was deemed in satisfactory  condition to undergo the procedure.                           After obtaining informed consent, the colonoscope                            was passed under direct vision. Throughout the                            procedure, the patient's blood pressure, pulse, and                            oxygen saturations were monitored continuously. The                            Olympus Scope SN: [id number] was introduced  through                            the anus and advanced to the the cecum, identified                            by appendiceal orifice and ileocecal valve. The                            colonoscopy was performed without difficulty. The                            patient tolerated the procedure well. The quality                            of the bowel preparation was adequate. The                            ileocecal valve, appendiceal orifice, and rectum                            were photographed. The bowel preparation used was                            Miralax via split dose instruction. Scope In: 1:48:34 PM Scope Out: 2:07:54 PM Scope Withdrawal Time: 0 hours 15 minutes 38 seconds  Total Procedure Duration: 0 hours 19 minutes 20 seconds  Findings:                 The perianal and digital rectal examinations were                            normal.                           Two sessile polyps were found in the sigmoid colon                            and descending colon. The polyps were diminutive in  size. These polyps were removed with a cold biopsy                            forceps. Resection and retrieval were complete.                            Verification of patient identification for the                            specimen was done. Estimated blood loss was minimal.                           Multiple diverticula were found in the left colon.                           External and internal hemorrhoids were found.                           The exam was otherwise without abnormality on                            direct and retroflexion views. Complications:            No immediate complications. Estimated Blood Loss:     Estimated blood loss was minimal. Impression:               - Two diminutive polyps in the sigmoid colon and in                            the descending colon, removed with a cold biopsy                            forceps. Resected  and retrieved.                           - Diverticulosis in the left colon.                           - External and internal hemorrhoids.                           - The examination was otherwise normal on direct                            and retroflexion views. Adequate prep w/                            significant lavage                           - Personal history of colonic polyps. 08/2010 5                            adenomas max 6 mm  11/2013 - 5 adenomas max 7 mm                           04/10/2017 4 polyps max 8mm - 2-3 adenomas Recommendation:           - Patient has a contact number available for                            emergencies. The signs and symptoms of potential                            delayed complications were discussed with the                            patient. Return to normal activities tomorrow.                            Written discharge instructions were provided to the                            patient.                           - Resume previous diet.                           - Continue present medications.                           - Await pathology results.                           - Repeat colonoscopy is recommended for                            surveillance. The colonoscopy date will be                            determined after pathology results from today's                            exam become available for review. Consider closer                            interval and differnt prep vs extra prep next time Iva Boop, MD 02/26/2023 2:15:22 PM This report has been signed electronically.

## 2023-02-26 NOTE — Progress Notes (Signed)
[facility] History and Physical   Primary Care Physician:  Henrine Screws, MD   Reason for Procedure:    Encounter Diagnosis  Name Primary?   Personal history of colonic polyps Yes     Plan:    colonoscopy     HPI: John Berry is a 72 y.o. male here for surveillance exam   08/2010 5 adenomas max 6 mm 11/2013 - 5 adenomas max 7 mm  04/10/2017 4 polyps max 8mm - 2-3 adenomas   Past Surgical History:  Procedure Laterality Date   ACHILLES TENDON REPAIR     x 3   BACK SURGERY  2005   BILATERAL CARPAL TUNNEL RELEASE     COLONOSCOPY  08/13/2010   CORONARY ANGIOPLASTY  12/13/2011   s/p Evolve study stent mid RCA 12/13/11   CRANIOTOMY  10/25/2009   right suboccipital craniotomy for microvascular decompression for trigeminal neuralgia   ELECTROPHYSIOLOGY STUDY N/A 12/16/2011   Procedure: ELECTROPHYSIOLOGY STUDY;  Surgeon: Marinus Maw, MD;  Location: [facility] CATH LAB;  Service: Cardiovascular;  Laterality: N/A;   LEFT HEART CATHETERIZATION WITH CORONARY ANGIOGRAM N/A 12/13/2011   PTCA Procedure:  Surgeon: Herby Abraham, MD;  Location: [facility] CATH LAB;  Service: Cardiovascular;  Laterality: N/A;   LEFT HEART CATHETERIZATION WITH CORONARY ANGIOGRAM N/A 12/16/2011   Procedure: LEFT HEART CATHETERIZATION WITH CORONARY ANGIOGRAM;  Surgeon: Vesta Mixer, MD;  Location: [facility] CATH LAB;  Service: Cardiovascular;  Laterality: N/A;   LUMBAR LAMINECTOMY/DECOMPRESSION MICRODISCECTOMY N/A 04/30/2017   Procedure: Microlumbar decompression L4-5, L3-4, L2-3, lateral mass fusion L4-5 with autograft/allograft bone;  Surgeon: Jene Every, MD;  Location: [facility] ORS;  Service: Orthopedics;  Laterality: N/A;   LUMBAR LAMINECTOMY/DECOMPRESSION MICRODISCECTOMY N/A 04/24/2022   Procedure: Revision microlumbar decompression Lumbar three -four;  Surgeon: Jene Every, MD;  Location: [facility] OR;  Service: Orthopedics;  Laterality: N/A;  2.5 hrs 3C-Bed   NASAL SINUS SURGERY     SPINE SURGERY      lumbar x 2   UVULOPALATOPHARYNGOPLASTY  08/21/2006    Prior to Admission medications   Medication Sig Start Date End Date Taking? Authorizing Provider  aspirin EC 81 MG tablet Take 81 mg by mouth daily. Swallow whole.    [provider]  cyclobenzaprine (FLEXERIL) 10 MG tablet Take by mouth. Patient not taking: Reported on 01/22/2023    [provider]  docusate sodium (COLACE) 100 MG capsule Take 1 capsule (100 mg total) by mouth 2 (two) times daily as needed for mild constipation. Patient not taking: Reported on 01/22/2023 04/24/22   Jene Every, MD  famotidine (PEPCID) 20 MG tablet Take 1 tablet (20 mg total) by mouth 2 (two) times daily. Patient not taking: Reported on 05/17/2022 03/30/22   Nira Conn, MD  ferrous sulfate 325 (65 FE) MG EC tablet Take 325 mg by mouth daily. Patient not taking: Reported on 07/09/2022    [provider]  gabapentin (NEURONTIN) 300 MG capsule Take 300 mg by mouth 3 (three) times daily. Patient not taking: Reported on 07/09/2022    [provider]  metoprolol tartrate (LOPRESSOR) 25 MG tablet Take 0.5 tablets (12.5 mg total) by mouth 2 (two) times daily. Take 1/2 (one-half) tablet by mouth twice daily 10/25/22   Ronney Asters, NP  Multiple Vitamin (MULTIVITAMIN) tablet Take 1 tablet by mouth every morning. Patient not taking: Reported on 01/22/2023    [provider]  nitroGLYCERIN (NITROSTAT) 0.4 MG SL tablet Place 1 tablet (0.4 mg total) under the tongue  every 5 (five) minutes as needed for chest pain. 01/21/19   Lewayne Bunting, MD  oxyCODONE (OXY IR/ROXICODONE) 5 MG immediate release tablet Take 1 tablet (5 mg total) by mouth every 4 (four) hours as needed for severe pain. Patient not taking: Reported on 07/09/2022 04/24/22   Jene Every, MD  polyethylene glycol (MIRALAX / GLYCOLAX) 17 g packet Take 17 g by mouth daily. Patient not taking: Reported on 07/09/2022 04/24/22   Jene Every, MD   rosuvastatin (CRESTOR) 20 MG tablet Take 1 tablet (20 mg total) by mouth daily. 05/17/22 05/12/23  Ronney Asters, NP    Current Outpatient Medications  Medication Sig Dispense Refill   aspirin EC 81 MG tablet Take 81 mg by mouth daily. Swallow whole.     cyclobenzaprine (FLEXERIL) 10 MG tablet Take by mouth. (Patient not taking: Reported on 01/22/2023)     docusate sodium (COLACE) 100 MG capsule Take 1 capsule (100 mg total) by mouth 2 (two) times daily as needed for mild constipation. (Patient not taking: Reported on 01/22/2023) 30 capsule 1   famotidine (PEPCID) 20 MG tablet Take 1 tablet (20 mg total) by mouth 2 (two) times daily. (Patient not taking: Reported on 05/17/2022) 30 tablet 0   ferrous sulfate 325 (65 FE) MG EC tablet Take 325 mg by mouth daily. (Patient not taking: Reported on 07/09/2022)     gabapentin (NEURONTIN) 300 MG capsule Take 300 mg by mouth 3 (three) times daily. (Patient not taking: Reported on 07/09/2022)     metoprolol tartrate (LOPRESSOR) 25 MG tablet Take 0.5 tablets (12.5 mg total) by mouth 2 (two) times daily. Take 1/2 (one-half) tablet by mouth twice daily 45 tablet 2   Multiple Vitamin (MULTIVITAMIN) tablet Take 1 tablet by mouth every morning. (Patient not taking: Reported on 01/22/2023)     nitroGLYCERIN (NITROSTAT) 0.4 MG SL tablet Place 1 tablet (0.4 mg total) under the tongue every 5 (five) minutes as needed for chest pain. 25 tablet 1   oxyCODONE (OXY IR/ROXICODONE) 5 MG immediate release tablet Take 1 tablet (5 mg total) by mouth every 4 (four) hours as needed for severe pain. (Patient not taking: Reported on 07/09/2022) 40 tablet 0   polyethylene glycol (MIRALAX / GLYCOLAX) 17 g packet Take 17 g by mouth daily. (Patient not taking: Reported on 07/09/2022) 14 each 0   rosuvastatin (CRESTOR) 20 MG tablet Take 1 tablet (20 mg total) by mouth daily. 90 tablet 3   Current Facility-Administered Medications  Medication Dose Route Frequency Provider Last Rate Last Admin    0.9 %  sodium chloride infusion  500 mL Intravenous Once Iva Boop, MD        Allergies as of 02/26/2023   (No Known Allergies)    Family History  Problem Relation Age of Onset   Hypertension Mother    Heart disease Mother 37   Hyperlipidemia Mother    Pancreatic cancer Father    Heart disease Brother 80   Colon cancer Neg Hx    Esophageal cancer Neg Hx    Prostate cancer Neg Hx    Rectal cancer Neg Hx    Stomach cancer Neg Hx    Colon polyps Neg Hx     Social History   Socioeconomic History   Marital status: Married    Spouse name: Arline Asp   Number of children: 3   Years of education: Not on file   Highest education level: Not on file  Occupational History   Occupation: YOUTH  PASTOR    Employer: [employer]  Tobacco Use   Smoking status: Never   Smokeless tobacco: Never  Vaping Use   Vaping status: Never Used  Substance and Sexual Activity   Alcohol use: No   Drug use: No   Sexual activity: Yes  Other Topics Concern   Not on file  Social History Narrative   Lives in [place], [place] with wife.    Exercise: No   Social Determinants of Health   Financial Resource Strain: Low Risk  (09/16/2022)   Received from [facility], [facility]   Overall Financial Resource Strain (CARDIA)    Difficulty of Paying Living Expenses: Not hard at all  Food Insecurity: No Food Insecurity (04/29/2022)   Hunger Vital Sign    Worried About Running Out of Food in the Last Year: Never true    Ran Out of Food in the Last Year: Never true  Transportation Needs: No Transportation Needs (04/29/2022)   PRAPARE - Administrator, Civil Service (Medical): No    Lack of Transportation (Non-Medical): No  Physical Activity: Not on file  Stress: No Stress Concern Present (09/16/2022)   Received from [facility], [facility] - Occupational Stress Questionnaire    Feeling of Stress : Not at all  Social Connections:  Unknown (08/15/2022)   Received from [facility], [facility]   Social Network    Social Network: Not on file  Intimate Partner Violence: Unknown (08/15/2022)   Received from [facility], [facility]   HITS    Physically Hurt: Not on file    Insult or Talk Down To: Not on file    Threaten Physical Harm: Not on file    Scream or Curse: Not on file    Review of Systems:  All other review of systems negative except as mentioned in the HPI.  Physical Exam: Vital signs BP (!) 162/74   Pulse 60   Temp 97.8 F (36.6 C)   Ht 5\' 11"  (1.803 m)   Wt 215 lb (97.5 kg)   SpO2 96%   BMI 29.99 kg/m   General:   Alert,  Well-developed, well-nourished, pleasant and cooperative in NAD Lungs:  Clear throughout to auscultation.   Heart:  Regular rate and rhythm; no murmurs, clicks, rubs,  or gallops. Abdomen:  Soft, nontender and nondistended. Normal bowel sounds.   Neuro/Psych:  Alert and cooperative. Normal mood and affect. A and O x 3   @Pascual Mantel  Sena Slate, MD, [facility] [phone] (pager) 02/26/2023 1:37 PM@

## 2023-02-26 NOTE — Progress Notes (Signed)
Called to room to assist during endoscopic procedure.  Patient ID and intended procedure confirmed with present staff. Received instructions for my participation in the procedure from the performing physician.  

## 2023-02-26 NOTE — Progress Notes (Signed)
Pt's states no medical or surgical changes since previsit or office visit. 

## 2023-02-26 NOTE — Patient Instructions (Addendum)
Recommendation:  - Patient has a contact number available for                            emergencies. The signs and symptoms of potential                            delayed complications were discussed with the                            patient. Return to normal activities tomorrow.                            Written discharge instructions were provided to the                            patient.                           - Resume previous diet.                           - Continue present medications.                           - Await pathology results.                           - Repeat colonoscopy is recommended for                            surveillance. The colonoscopy date will be                            determined after pathology results from today's                            exam become available for review. Consider closer                            interval and differnt prep vs extra prep next time  Handouts on polyps, diverticulosis and hemorrhoids given.  YOU HAD AN ENDOSCOPIC PROCEDURE TODAY AT THE [facility]:   Refer to the procedure report that was given to you for any specific questions about what was found during the examination.  If the procedure report does not answer your questions, please call your gastroenterologist to clarify.  If you requested that your care partner not be given the details of your procedure findings, then the procedure report has been included in a sealed envelope for you to review at your convenience later.  YOU SHOULD EXPECT: Some feelings of bloating in the abdomen. Passage of more gas than usual.  Walking can help get rid of the air that was put into your GI tract during the procedure and reduce the bloating. If you had a lower endoscopy (such as a colonoscopy or flexible sigmoidoscopy) you may notice spotting of blood in your stool or on the toilet paper. If you underwent a bowel prep for your  procedure, you may not have a normal  bowel movement for a few days.  Please Note:  You might notice some irritation and congestion in your nose or some drainage.  This is from the oxygen used during your procedure.  There is no need for concern and it should clear up in a day or so.  SYMPTOMS TO REPORT IMMEDIATELY:  Following lower endoscopy (colonoscopy or flexible sigmoidoscopy):  Excessive amounts of blood in the stool  Significant tenderness or worsening of abdominal pains  Swelling of the abdomen that is new, acute  Fever of 100F or higher   For urgent or emergent issues, a gastroenterologist can be reached at any hour by calling [phone]. Do not use MyChart messaging for urgent concerns.    DIET:  We do recommend a small meal at first, but then you may proceed to your regular diet.  Drink plenty of fluids but you should avoid alcoholic beverages for 24 hours.  ACTIVITY:  You should plan to take it easy for the rest of today and you should NOT DRIVE or use heavy machinery until tomorrow (because of the sedation medicines used during the test).    FOLLOW UP: Our staff will call the number listed on your records the next business day following your procedure.  We will call around 7:15- 8:00 am to check on you and address any questions or concerns that you may have regarding the information given to you following your procedure. If we do not reach you, we will leave a message.     If any biopsies were taken you will be contacted by phone or by letter within the next 1-3 weeks.  Please call us at [phone] if you have not heard about the biopsies in 3 weeks.    SIGNATURES/CONFIDENTIALITY: You and/or your care partner have signed paperwork which will be entered into your electronic medical record.  These signatures attest to the fact that that the information above on your After Visit Summary has been reviewed and is understood.  Full responsibility of the confidentiality of this discharge information lies with  you and/or your care-partner.Two tiny polyps removed today.  Also saw diverticulosis and swollen hemorrhoids.  I will let you know pathology results and when to have another routine colonoscopy by mail and/or My Chart.  I appreciate the opportunity to care for you. Iva Boop, MD, [facility]

## 2023-02-27 ENCOUNTER — Telehealth: Payer: Self-pay | Admitting: *Deleted

## 2023-02-27 NOTE — Telephone Encounter (Signed)
  Follow up Call-     02/26/2023    1:34 PM  Call back number  Post procedure Call Back phone  # [phone]  Permission to leave phone message Yes     Patient questions:  Do you have a fever, pain , or abdominal swelling? No. Pain Score  0 *  Have you tolerated food without any problems? Yes.    Have you been able to return to your normal activities? Yes.    Do you have any questions about your discharge instructions: Diet   No. Medications  No. Follow up visit  No.  Do you have questions or concerns about your Care? No.  Actions: * If pain score is 4 or above: No action needed, pain <4.

## 2023-03-05 ENCOUNTER — Encounter: Payer: Self-pay | Admitting: Internal Medicine

## 2023-03-24 ENCOUNTER — Other Ambulatory Visit: Payer: Self-pay | Admitting: General Practice

## 2023-04-23 ENCOUNTER — Other Ambulatory Visit: Payer: Self-pay | Admitting: Specialist

## 2023-06-05 DIAGNOSIS — L814 Other melanin hyperpigmentation: Secondary | ICD-10-CM | POA: Diagnosis not present

## 2023-06-05 DIAGNOSIS — L821 Other seborrheic keratosis: Secondary | ICD-10-CM | POA: Diagnosis not present

## 2023-06-05 DIAGNOSIS — C44319 Basal cell carcinoma of skin of other parts of face: Secondary | ICD-10-CM | POA: Diagnosis not present

## 2023-06-05 DIAGNOSIS — Z85820 Personal history of malignant melanoma of skin: Secondary | ICD-10-CM | POA: Diagnosis not present

## 2023-06-05 DIAGNOSIS — L905 Scar conditions and fibrosis of skin: Secondary | ICD-10-CM | POA: Diagnosis not present

## 2023-06-13 DIAGNOSIS — Z0000 Encounter for general adult medical examination without abnormal findings: Secondary | ICD-10-CM | POA: Diagnosis not present

## 2023-06-13 DIAGNOSIS — E782 Mixed hyperlipidemia: Secondary | ICD-10-CM | POA: Diagnosis not present

## 2023-06-13 DIAGNOSIS — N401 Enlarged prostate with lower urinary tract symptoms: Secondary | ICD-10-CM | POA: Diagnosis not present

## 2023-06-13 DIAGNOSIS — I10 Essential (primary) hypertension: Secondary | ICD-10-CM | POA: Diagnosis not present

## 2023-06-13 DIAGNOSIS — R7309 Other abnormal glucose: Secondary | ICD-10-CM | POA: Diagnosis not present

## 2023-06-22 ENCOUNTER — Other Ambulatory Visit: Payer: Self-pay | Admitting: General Practice

## 2023-07-24 ENCOUNTER — Other Ambulatory Visit: Payer: Self-pay | Admitting: General Practice

## 2023-07-24 DIAGNOSIS — C4401 Basal cell carcinoma of skin of lip: Secondary | ICD-10-CM | POA: Diagnosis not present

## 2023-08-07 ENCOUNTER — Other Ambulatory Visit: Payer: Self-pay | Admitting: General Practice

## 2023-08-12 DIAGNOSIS — M48062 Spinal stenosis, lumbar region with neurogenic claudication: Secondary | ICD-10-CM | POA: Diagnosis not present

## 2023-08-25 ENCOUNTER — Other Ambulatory Visit: Payer: Self-pay | Admitting: General Practice

## 2023-08-28 ENCOUNTER — Other Ambulatory Visit: Payer: Self-pay | Admitting: Cardiology

## 2023-09-08 ENCOUNTER — Other Ambulatory Visit: Payer: Self-pay | Admitting: Cardiology

## 2023-09-11 DIAGNOSIS — E7439 Other disorders of intestinal carbohydrate absorption: Secondary | ICD-10-CM | POA: Diagnosis not present

## 2023-09-30 DIAGNOSIS — K080 Exfoliation of teeth due to systemic causes: Secondary | ICD-10-CM | POA: Diagnosis not present

## 2023-10-13 ENCOUNTER — Other Ambulatory Visit: Payer: Self-pay | Admitting: Cardiology

## 2023-10-17 ENCOUNTER — Other Ambulatory Visit: Payer: Self-pay | Admitting: Cardiology

## 2023-10-21 ENCOUNTER — Other Ambulatory Visit: Payer: Self-pay | Admitting: Cardiology

## 2023-10-29 ENCOUNTER — Telehealth: Payer: Self-pay | Admitting: Cardiology

## 2023-10-29 NOTE — Telephone Encounter (Signed)
*  STAT* If patient is at the pharmacy, call can be transferred to refill team.   1. Which medications need to be refilled? (please list name of each medication and dose if known)  metoprolol  tartrate (LOPRESSOR ) 25 MG tablet rosuvastatin  (CRESTOR ) 20 MG tablet  2. Which pharmacy/location (including street and city if local pharmacy) is medication to be sent to? [facility] [address], [place] - [address]   3. Do they need a 30 day or 90 day supply?  90 day supply  Patient has 1 week of medication remaining.

## 2023-10-30 MED ORDER — METOPROLOL TARTRATE 25 MG PO TABS: 12.5000 mg | ORAL_TABLET | Freq: Two times a day (BID) | ORAL | 1 refills | Status: DC

## 2023-10-30 MED ORDER — ROSUVASTATIN CALCIUM 20 MG PO TABS: 20.0000 mg | ORAL_TABLET | Freq: Every day | ORAL | 1 refills | Status: DC

## 2023-10-30 NOTE — Telephone Encounter (Signed)
 Pt's medications was sent to pt's pharmacy as requested. Confirmation received.

## 2024-01-02 NOTE — Progress Notes (Unsigned)
 HPI: FU coronary artery disease.  Catheterization in June of 2013 showed 70% LAD, 80-90% septal, and a small diagonal with a 90% ostial lesion; 80% mid right coronary artery and the PDA had a 70% mid lesion. The patient had PCI of the right coronary artery. He has been treated medically for his LAD disease. His ejection fraction is preserved. The patient did have an EP study because of unexplained syncope in June of 2013. There was no inducible ventricular tachycardia or supraventricular tachycardia. Exercise tolerance test in March 2016 negative. MRA of abd 12/18 showed no AAA. ABIs 1/22 normal.  Nuclear study January 2023 showed ejection fraction 50% and inferior attenuation but no ischemia.  Since last seen, the patient denies any dyspnea on exertion, orthopnea, PND, pedal edema, palpitations, syncope or chest pain.   Current Outpatient Medications  Medication Sig Dispense Refill   aspirin  EC 81 MG tablet Take 81 mg by mouth daily. Swallow whole.     metoprolol  tartrate (LOPRESSOR ) 25 MG tablet Take 0.5 tablets (12.5 mg total) by mouth 2 (two) times daily. 30 tablet 1   rosuvastatin  (CRESTOR ) 20 MG tablet Take 1 tablet (20 mg total) by mouth daily. 30 tablet 1   nitroGLYCERIN  (NITROSTAT ) 0.4 MG SL tablet Place 1 tablet (0.4 mg total) under the tongue every 5 (five) minutes as needed for chest pain. (Patient not taking: Reported on 01/08/2024) 25 tablet 1   polyethylene glycol (MIRALAX  / GLYCOLAX ) 17 g packet Take 17 g by mouth daily. (Patient not taking: Reported on 01/08/2024) 14 each 0   No current facility-administered medications for this visit.     Past Medical History:  Diagnosis Date   CAD (coronary artery disease)    s/p Evolve study stent mid RCA 12/13/11   Cancer (HCC)    hx of skin cancer    Diverticulosis    History of heart artery stent    2013   Hyperlipidemia    Hypertension    OSA (obstructive sleep apnea)    surgery in 2011 no CPAP   Personal history of colonic  polyps - adenomas 08/13/2010   TUBULAR ADENOMAS (X4)   Sleep apnea    Trigeminal neuralgia 09/2009   Microvascular decompression    Past Surgical History:  Procedure Laterality Date   ACHILLES TENDON REPAIR     x 3   BACK SURGERY  2005   BILATERAL CARPAL TUNNEL RELEASE     COLONOSCOPY  08/13/2010   CORONARY ANGIOPLASTY  12/13/2011   s/p Evolve study stent mid RCA 12/13/11   CRANIOTOMY  10/25/2009   right suboccipital craniotomy for microvascular decompression for trigeminal neuralgia   ELECTROPHYSIOLOGY STUDY N/A 12/16/2011   Procedure: ELECTROPHYSIOLOGY STUDY;  Surgeon: Danelle LELON Birmingham, MD;  Location: [facility] CATH LAB;  Service: Cardiovascular;  Laterality: N/A;   LEFT HEART CATHETERIZATION WITH CORONARY ANGIOGRAM N/A 12/13/2011   PTCA Procedure:  Surgeon: Debby JONETTA Como, MD;  Location: [facility] CATH LAB;  Service: Cardiovascular;  Laterality: N/A;   LEFT HEART CATHETERIZATION WITH CORONARY ANGIOGRAM N/A 12/16/2011   Procedure: LEFT HEART CATHETERIZATION WITH CORONARY ANGIOGRAM;  Surgeon: Aleene JINNY Passe, MD;  Location: [facility] CATH LAB;  Service: Cardiovascular;  Laterality: N/A;   LUMBAR LAMINECTOMY/DECOMPRESSION MICRODISCECTOMY N/A 04/30/2017   Procedure: Microlumbar decompression L4-5, L3-4, L2-3, lateral mass fusion L4-5 with autograft/allograft bone;  Surgeon: Duwayne Purchase, MD;  Location: [facility] ORS;  Service: Orthopedics;  Laterality: N/A;   LUMBAR LAMINECTOMY/DECOMPRESSION MICRODISCECTOMY N/A 04/24/2022   Procedure: Revision microlumbar decompression Lumbar  three -four;  Surgeon: Duwayne Purchase, MD;  Location: [facility] OR;  Service: Orthopedics;  Laterality: N/A;  2.5 hrs 3C-Bed   NASAL SINUS SURGERY     SPINE SURGERY     lumbar x 2   UVULOPALATOPHARYNGOPLASTY  08/21/2006    Social History   Socioeconomic History   Marital status: Married    Spouse name: Administrator, sports   Number of children: 3   Years of education: Not on file   Highest education level: Not on file  Occupational History    Occupation: YOUTH PASTOR    Employer: [employer]  Tobacco Use   Smoking status: Never   Smokeless tobacco: Never  Vaping Use   Vaping status: Never Used  Substance and Sexual Activity   Alcohol use: No   Drug use: No   Sexual activity: Yes  Other Topics Concern   Not on file  Social History Narrative   Lives in [place], [place] with wife.    Exercise: No   Social Drivers of Corporate investment banker Strain: Low Risk  (09/16/2022)   Received from [facility]   Overall Financial Resource Strain (CARDIA)    Difficulty of Paying Living Expenses: Not hard at all  Food Insecurity: No Food Insecurity (04/29/2022)   Hunger Vital Sign    Worried About Running Out of Food in the Last Year: Never true    Ran Out of Food in the Last Year: Never true  Transportation Needs: No Transportation Needs (04/29/2022)   PRAPARE - Administrator, Civil Service (Medical): No    Lack of Transportation (Non-Medical): No  Physical Activity: Not on file  Stress: No Stress Concern Present (09/16/2022)   Received from [facility] - Occupational Stress Questionnaire    Feeling of Stress : Not at all  Social Connections: Unknown (08/15/2022)   Received from [facility]   Social Network    Social Network: Not on file  Intimate Partner Violence: Unknown (08/15/2022)   Received from [facility]   HITS    Physically Hurt: Not on file    Insult or Talk Down To: Not on file    Threaten Physical Harm: Not on file    Scream or Curse: Not on file    Family History  Problem Relation Age of Onset   Hypertension Mother    Heart disease Mother 32   Hyperlipidemia Mother    Pancreatic cancer Father    Heart disease Brother 103   Colon cancer Neg Hx    Esophageal cancer Neg Hx    Prostate cancer Neg Hx    Rectal cancer Neg Hx    Stomach cancer Neg Hx    Colon polyps Neg Hx     ROS: no fevers or chills, productive cough, hemoptysis,  dysphasia, odynophagia, melena, hematochezia, dysuria, hematuria, rash, seizure activity, orthopnea, PND, pedal edema, claudication. Remaining systems are negative.  Physical Exam: Well-developed well-nourished in no acute distress.  Skin is warm and dry.  HEENT is normal.  Neck is supple.  Chest is clear to auscultation with normal expansion.  Cardiovascular exam is regular rate and rhythm.  Abdominal exam nontender or distended. No masses palpated. Extremities show no edema. neuro grossly intact  EKG Interpretation Date/Time:  Thursday January 08 2024 16:33:23 EDT Ventricular Rate:  62 PR Interval:  150 QRS Duration:  98 QT Interval:  430 QTC Calculation: 436 R Axis:   -43  Text Interpretation: Normal sinus rhythm  Left axis deviation Cannot rule out Inferior infarct Confirmed by Pietro Rogue ([id number]) on 01/08/2024 4:35:45 PM    A/P  1 coronary artery disease-patient denies chest pain.  Continue medical therapy with aspirin  and statin.  2 hyperlipidemia-continue statin.  3 peripheral vascular disease-patient denies claudication.  Continue aspirin  and statin.  Rogue Pietro, MD

## 2024-01-08 ENCOUNTER — Encounter: Payer: Self-pay | Admitting: Cardiology

## 2024-01-08 ENCOUNTER — Ambulatory Visit: Attending: Cardiology | Admitting: Cardiology

## 2024-01-08 VITALS — BP 110/84 | HR 62 | Wt 225.0 lb

## 2024-01-08 DIAGNOSIS — I2510 Atherosclerotic heart disease of native coronary artery without angina pectoris: Secondary | ICD-10-CM

## 2024-01-08 DIAGNOSIS — E7800 Pure hypercholesterolemia, unspecified: Secondary | ICD-10-CM

## 2024-01-08 NOTE — Patient Instructions (Signed)

## 2024-01-10 ENCOUNTER — Other Ambulatory Visit: Payer: Self-pay | Admitting: Cardiology

## 2024-01-12 ENCOUNTER — Other Ambulatory Visit: Payer: Self-pay | Admitting: Cardiology

## 2024-03-07 ENCOUNTER — Other Ambulatory Visit: Payer: Self-pay | Admitting: Cardiology

## 2024-04-25 ENCOUNTER — Other Ambulatory Visit: Payer: Self-pay | Admitting: Cardiology
